# Patient Record
Sex: Female | Born: 1980 | Race: Black or African American | Hispanic: No | Marital: Married | Smoking: Never smoker
Health system: Southern US, Community
[De-identification: ages and names within clinical notes are randomized; demographics above are authoritative.]

## PROBLEM LIST (undated history)

## (undated) DIAGNOSIS — Z8661 Personal history of infections of the central nervous system: Secondary | ICD-10-CM

## (undated) DIAGNOSIS — Z8701 Personal history of pneumonia (recurrent): Secondary | ICD-10-CM

## (undated) DIAGNOSIS — I313 Pericardial effusion (noninflammatory): Secondary | ICD-10-CM

## (undated) DIAGNOSIS — I341 Nonrheumatic mitral (valve) prolapse: Secondary | ICD-10-CM

## (undated) DIAGNOSIS — E559 Vitamin D deficiency, unspecified: Secondary | ICD-10-CM

## (undated) DIAGNOSIS — G8929 Other chronic pain: Secondary | ICD-10-CM

## (undated) DIAGNOSIS — Z9289 Personal history of other medical treatment: Secondary | ICD-10-CM

## (undated) DIAGNOSIS — I3139 Other pericardial effusion (noninflammatory): Secondary | ICD-10-CM

## (undated) DIAGNOSIS — R519 Headache, unspecified: Secondary | ICD-10-CM

## (undated) DIAGNOSIS — R739 Hyperglycemia, unspecified: Secondary | ICD-10-CM

## (undated) DIAGNOSIS — D649 Anemia, unspecified: Secondary | ICD-10-CM

## (undated) HISTORY — DX: Headache, unspecified: R51.9

## (undated) HISTORY — DX: Other chronic pain: G89.29

## (undated) HISTORY — DX: Personal history of pneumonia (recurrent): Z87.01

## (undated) HISTORY — DX: Hyperglycemia, unspecified: R73.9

## (undated) HISTORY — DX: Vitamin D deficiency, unspecified: E55.9

---

## 2012-05-13 ENCOUNTER — Emergency Department: Payer: Self-pay | Admitting: Emergency Medicine

## 2012-05-13 LAB — COMPREHENSIVE METABOLIC PANEL
Anion Gap: 9 (ref 7–16)
Calcium, Total: 9 mg/dL (ref 8.5–10.1)
Chloride: 110 mmol/L — ABNORMAL HIGH (ref 98–107)
Co2: 25 mmol/L (ref 21–32)
Creatinine: 0.8 mg/dL (ref 0.60–1.30)
EGFR (African American): >60
Glucose: 82 mg/dL (ref 65–99)
Potassium: 3.1 mmol/L — ABNORMAL LOW (ref 3.5–5.1)
SGOT(AST): 23 U/L (ref 15–37)
Sodium: 144 mmol/L (ref 136–145)
Total Protein: 7.2 g/dL (ref 6.4–8.2)

## 2012-05-13 LAB — CBC WITH DIFFERENTIAL/PLATELET
Basophil #: 0 10*3/uL (ref 0.0–0.1)
Basophil %: 0.9 %
Eosinophil #: 0.4 10*3/uL (ref 0.0–0.7)
Eosinophil %: 14.7 %
HCT: 36.7 % (ref 35.0–47.0)
HGB: 11.6 g/dL — ABNORMAL LOW (ref 12.0–16.0)
Lymphocyte %: 42.9 %
MCH: 26.1 pg (ref 26.0–34.0)
MCHC: 31.6 g/dL — ABNORMAL LOW (ref 32.0–36.0)
MCV: 83 fL (ref 80–100)
Monocyte #: 0.3 x10 3/mm (ref 0.2–0.9)
Monocyte %: 9 %
Neutrophil #: 1 10*3/uL — ABNORMAL LOW (ref 1.4–6.5)
Neutrophil %: 32.5 %
Platelet: 271 10*3/uL (ref 150–440)
RDW: 20.5 % — ABNORMAL HIGH (ref 11.5–14.5)
WBC: 3 10*3/uL — ABNORMAL LOW (ref 3.6–11.0)

## 2012-12-02 DIAGNOSIS — Z98891 History of uterine scar from previous surgery: Secondary | ICD-10-CM | POA: Insufficient documentation

## 2012-12-02 DIAGNOSIS — Z9289 Personal history of other medical treatment: Secondary | ICD-10-CM | POA: Insufficient documentation

## 2013-02-17 ENCOUNTER — Emergency Department: Payer: Self-pay | Admitting: Emergency Medicine

## 2013-02-17 LAB — COMPREHENSIVE METABOLIC PANEL WITH GFR
Albumin: 3.6 g/dL
Alkaline Phosphatase: 94 U/L
Anion Gap: 4 — ABNORMAL LOW
BUN: 7 mg/dL
Bilirubin,Total: 0.3 mg/dL
Calcium, Total: 8.5 mg/dL
Chloride: 110 mmol/L — ABNORMAL HIGH
Co2: 26 mmol/L
Creatinine: 0.75 mg/dL
EGFR (African American): >60
EGFR (Non-African Amer.): >60
Glucose: 88 mg/dL
Osmolality: 277
Potassium: 4.1 mmol/L
SGOT(AST): 20 U/L
SGPT (ALT): 14 U/L
Sodium: 140 mmol/L
Total Protein: 7.4 g/dL

## 2013-02-17 LAB — CBC
HCT: 34.9 % — ABNORMAL LOW
HGB: 11.3 g/dL — ABNORMAL LOW
MCH: 27.4 pg
MCHC: 32.3 g/dL
MCV: 85 fL
Platelet: 342 x10 3/mm 3
RBC: 4.11 X10 6/mm 3
RDW: 13.7 %
WBC: 4.3 x10 3/mm 3

## 2013-02-17 LAB — URINALYSIS, COMPLETE
Bilirubin,UR: NEGATIVE
Blood: NEGATIVE
Glucose,UR: NEGATIVE mg/dL (ref 0–75)
Ketone: NEGATIVE
Nitrite: NEGATIVE
Ph: 5 (ref 4.5–8.0)
Protein: NEGATIVE
RBC,UR: <1 /HPF (ref 0–5)
Specific Gravity: 1.026 (ref 1.003–1.030)
WBC UR: 2 /HPF (ref 0–5)

## 2013-02-17 LAB — LIPASE, BLOOD: Lipase: 86 U/L (ref 73–393)

## 2013-02-17 IMAGING — US US OB < 14 WEEKS - US OB TV
1 series · 14 of 28 positions shown · non-contrast
Comparison: none

REASON FOR EXAM: pelvic pain
COMMENTS:

[Series 1: us ob < 14 weeks - us ob tv · 0.21mm/px · 14 of 100 slices shown]
[im 4/100]
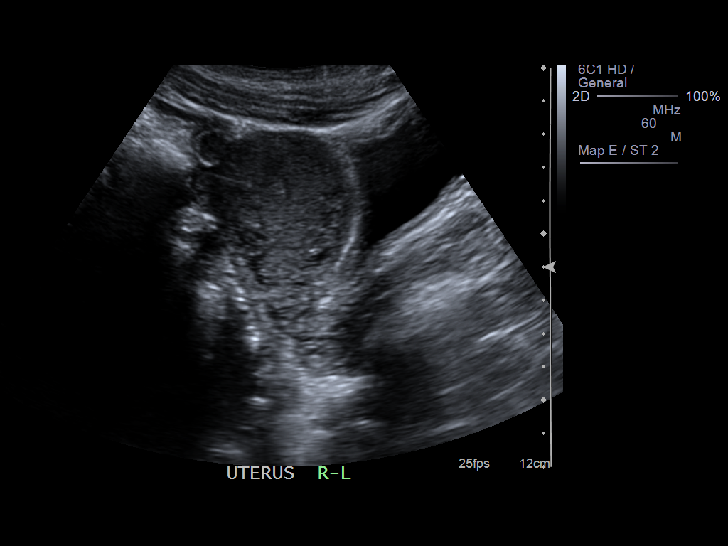
[im 12/100]
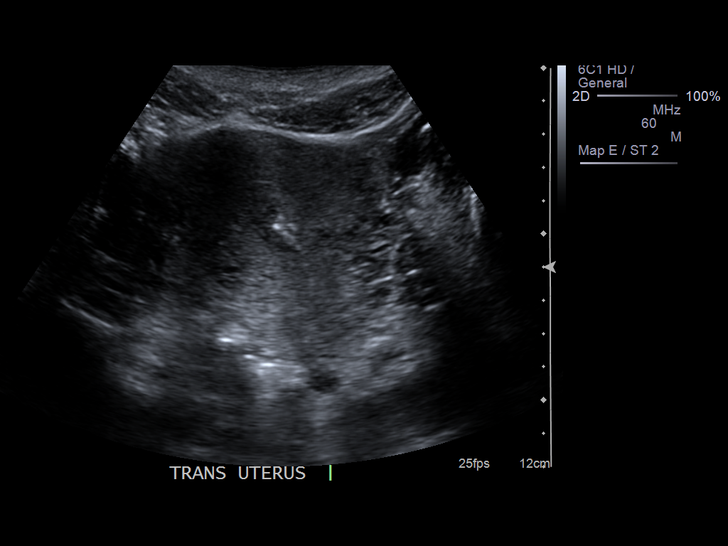
[im 19/100]
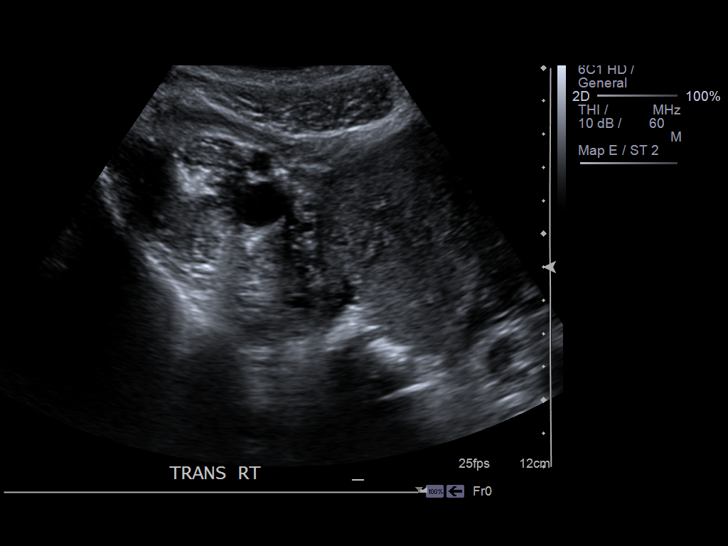
[im 26/100]
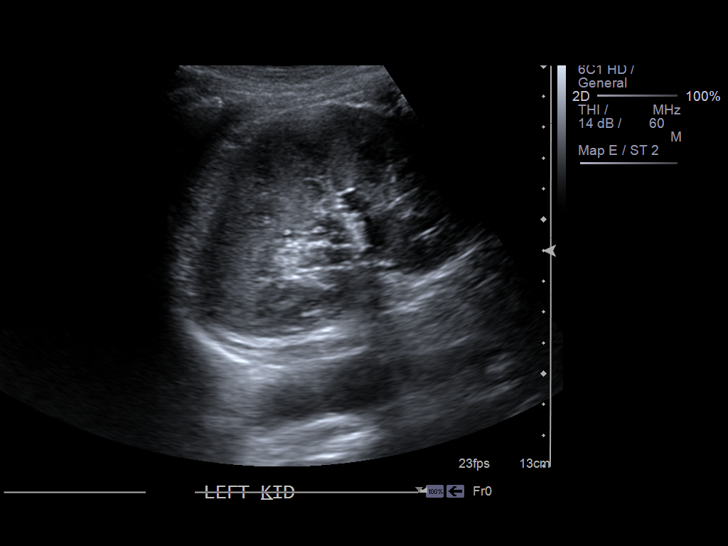
[im 34/100]
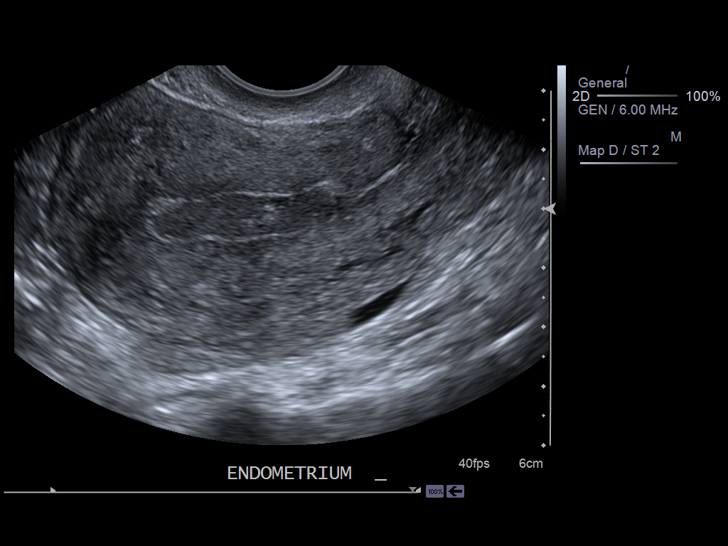
[im 41/100]
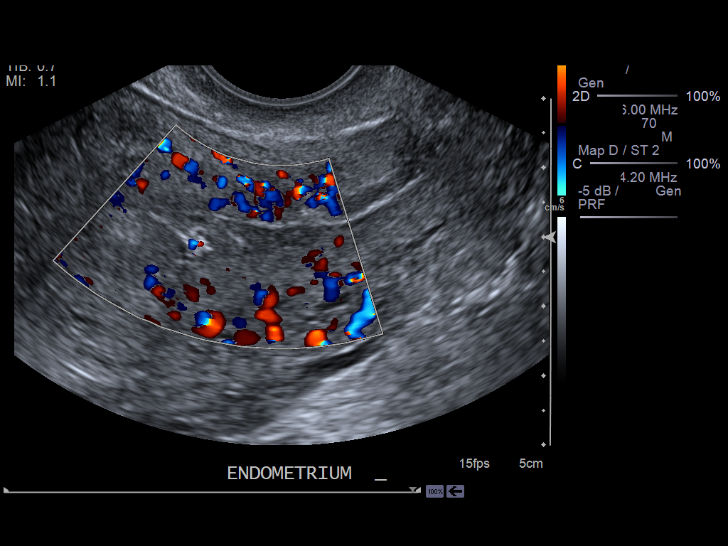
[im 48/100]
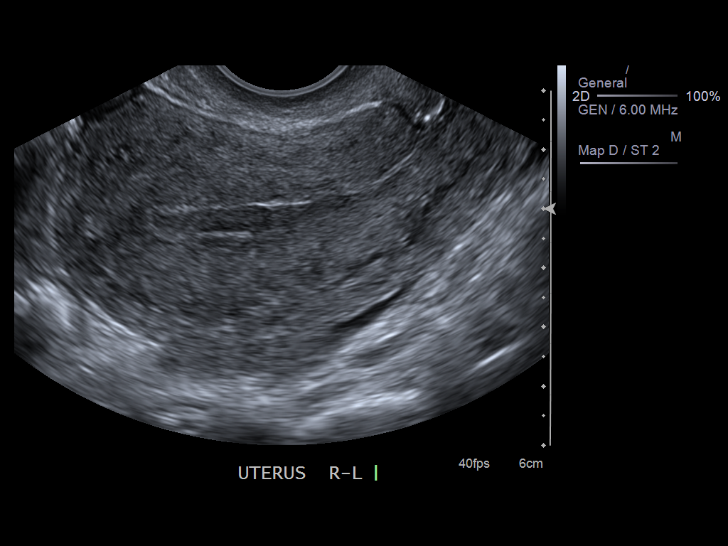
[im 56/100]
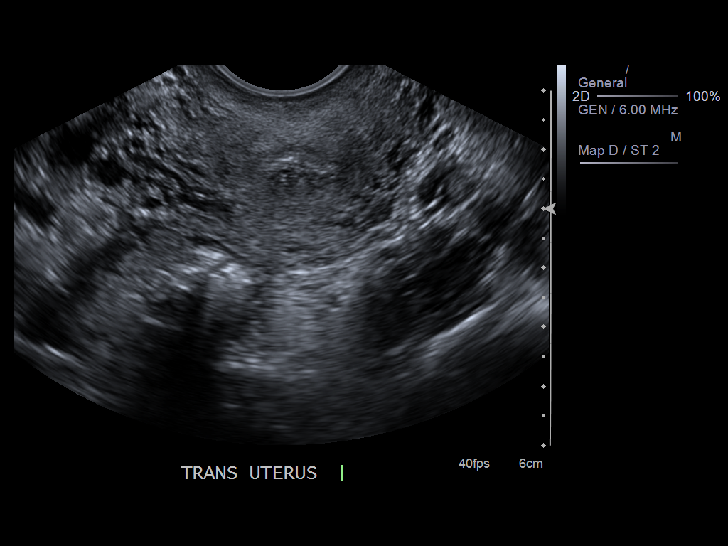
[im 63/100]
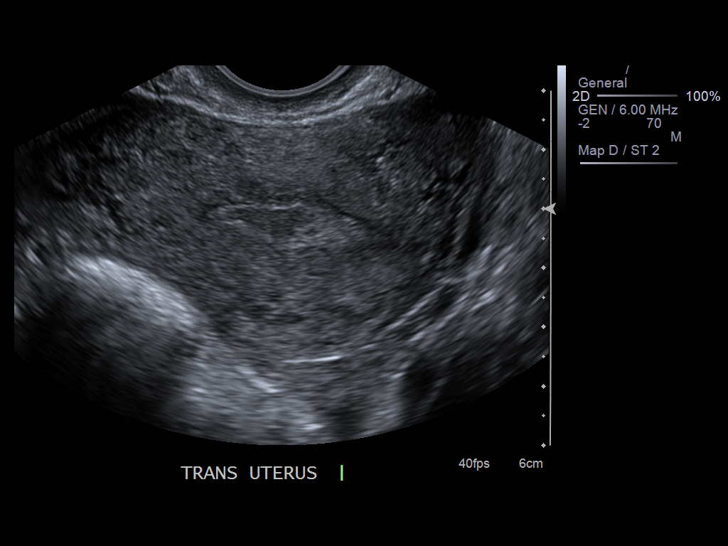
[im 70/100]
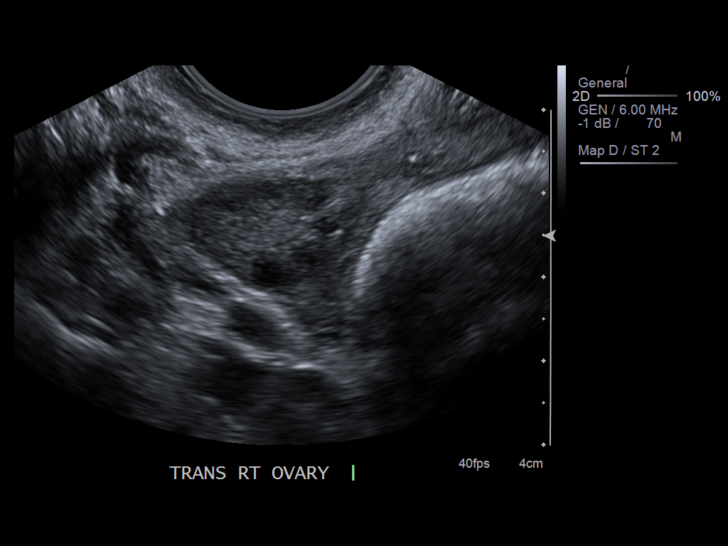
[im 78/100]
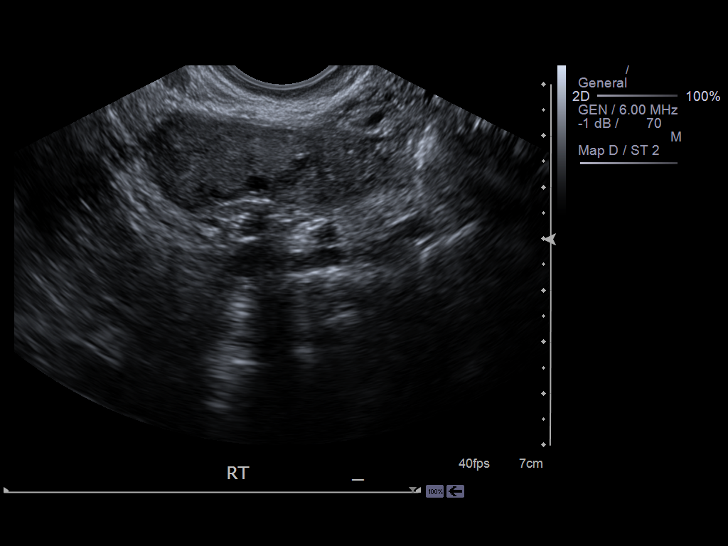
[im 85/100]
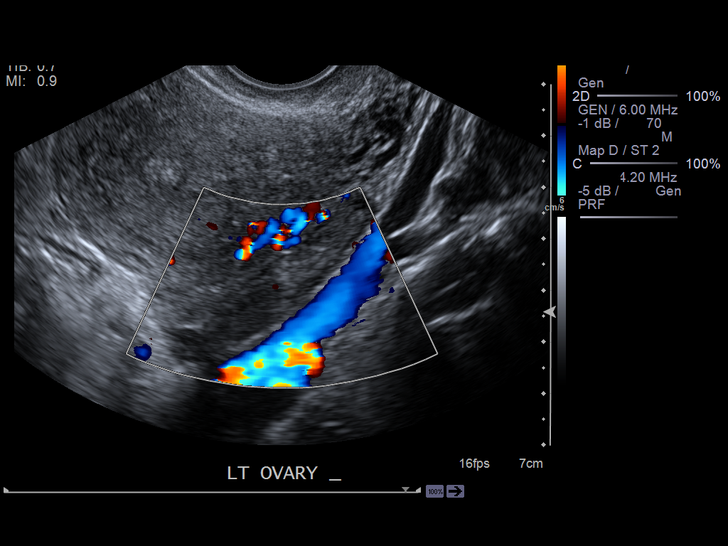
[im 92/100]
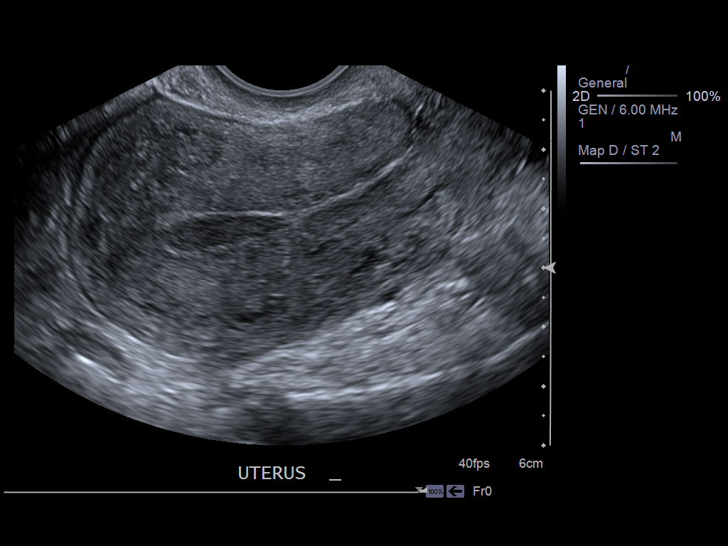
[im 100/100]
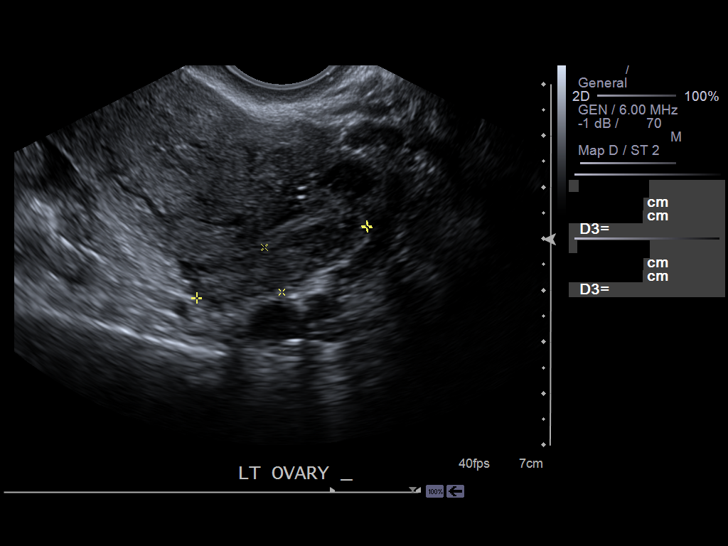

[14 of 28 positions shown; findings below may reference images not displayed]

PROCEDURE:     US  - US OB LESS THAN 14 WEEKS/W TRANS  - [DATE]  [DATE]

RESULT:     Pelvic sonogram with transabdominal and endovaginal scanning
demonstrates the uterus measures 8.61 x 5.46 x 3.98 cm. Endometrial stripe
thickness is 8.6 mm. There is in no evidence of hydronephrosis in either
kidney. There is free fluid in the cul-de-sac. There some hyperechogenicity
with evidence of blood flow in the endometrial cavity. Given the recent
surgical procedure correlate for underlying hemorrhage, infection or
retained products of conception. Both ovaries show normal size and
echotexture with evidence of arterial and venous blood flow.
IMPRESSION: Hypervascular appearance with pelvic free fluid and some
hyper echogenicity within the endometrium. Gynecologic followup is
recommended

[REDACTED] (*)

## 2014-12-26 ENCOUNTER — Ambulatory Visit: Payer: Self-pay | Admitting: Family Medicine

## 2015-07-18 DIAGNOSIS — R87810 Cervical high risk human papillomavirus (HPV) DNA test positive: Secondary | ICD-10-CM | POA: Insufficient documentation

## 2015-10-25 DIAGNOSIS — G43909 Migraine, unspecified, not intractable, without status migrainosus: Secondary | ICD-10-CM | POA: Insufficient documentation

## 2015-10-28 DIAGNOSIS — I341 Nonrheumatic mitral (valve) prolapse: Secondary | ICD-10-CM | POA: Insufficient documentation

## 2015-11-07 ENCOUNTER — Emergency Department
Admission: EM | Admit: 2015-11-07 | Discharge: 2015-11-07 | Disposition: A | Discharge status: Home / Self Care | Payer: BLUE CROSS/BLUE SHIELD | Attending: Emergency Medicine | Admitting: Emergency Medicine

## 2015-11-07 ENCOUNTER — Encounter: Payer: Self-pay | Admitting: Emergency Medicine

## 2015-11-07 DIAGNOSIS — H53149 Visual discomfort, unspecified: Secondary | ICD-10-CM | POA: Diagnosis not present

## 2015-11-07 DIAGNOSIS — R197 Diarrhea, unspecified: Secondary | ICD-10-CM | POA: Insufficient documentation

## 2015-11-07 DIAGNOSIS — R51 Headache: Secondary | ICD-10-CM | POA: Diagnosis not present

## 2015-11-07 DIAGNOSIS — O210 Mild hyperemesis gravidarum: Secondary | ICD-10-CM | POA: Diagnosis not present

## 2015-11-07 DIAGNOSIS — R1084 Generalized abdominal pain: Secondary | ICD-10-CM | POA: Diagnosis not present

## 2015-11-07 DIAGNOSIS — O9989 Other specified diseases and conditions complicating pregnancy, childbirth and the puerperium: Secondary | ICD-10-CM | POA: Insufficient documentation

## 2015-11-07 DIAGNOSIS — Z3A14 14 weeks gestation of pregnancy: Secondary | ICD-10-CM | POA: Insufficient documentation

## 2015-11-07 DIAGNOSIS — O99352 Diseases of the nervous system complicating pregnancy, second trimester: Secondary | ICD-10-CM | POA: Diagnosis not present

## 2015-11-07 DIAGNOSIS — R519 Headache, unspecified: Secondary | ICD-10-CM

## 2015-11-07 HISTORY — DX: Pericardial effusion (noninflammatory): I31.3

## 2015-11-07 HISTORY — DX: Personal history of infections of the central nervous system: Z86.61

## 2015-11-07 HISTORY — DX: Other pericardial effusion (noninflammatory): I31.39

## 2015-11-07 HISTORY — DX: Anemia, unspecified: D64.9

## 2015-11-07 LAB — CBC WITH DIFFERENTIAL/PLATELET
BASOS PCT: 0 %
Basophils Absolute: 0 10*3/uL (ref 0–0.1)
EOS ABS: 0 10*3/uL (ref 0–0.7)
Eosinophils Relative: 1 %
HEMATOCRIT: 38.3 % (ref 35.0–47.0)
HEMOGLOBIN: 12.9 g/dL (ref 12.0–16.0)
LYMPHS ABS: 0.8 10*3/uL — AB (ref 1.0–3.6)
Lymphocytes Relative: 12 %
MCH: 28.8 pg (ref 26.0–34.0)
MCHC: 33.6 g/dL (ref 32.0–36.0)
MCV: 85.8 fL (ref 80.0–100.0)
Monocytes Absolute: 0.2 10*3/uL (ref 0.2–0.9)
Monocytes Relative: 3 %
NEUTROS ABS: 5.4 10*3/uL (ref 1.4–6.5)
NEUTROS PCT: 84 %
Platelets: 248 10*3/uL (ref 150–440)
RBC: 4.46 MIL/uL (ref 3.80–5.20)
RDW: 13.5 % (ref 11.5–14.5)
WBC: 6.4 10*3/uL (ref 3.6–11.0)

## 2015-11-07 LAB — URINALYSIS COMPLETE WITH MICROSCOPIC (ARMC ONLY)
Bilirubin Urine: NEGATIVE
Glucose, UA: NEGATIVE mg/dL
HGB URINE DIPSTICK: NEGATIVE
Leukocytes, UA: NEGATIVE
NITRITE: NEGATIVE
PH: 7 (ref 5.0–8.0)
PROTEIN: NEGATIVE mg/dL
SPECIFIC GRAVITY, URINE: 1.018 (ref 1.005–1.030)
WBC UA: NONE SEEN WBC/hpf (ref 0–5)

## 2015-11-07 LAB — COMPREHENSIVE METABOLIC PANEL
ALT: 16 U/L (ref 14–54)
ANION GAP: 7 (ref 5–15)
AST: 17 U/L (ref 15–41)
Albumin: 3.5 g/dL (ref 3.5–5.0)
Alkaline Phosphatase: 59 U/L (ref 38–126)
BUN: 10 mg/dL (ref 6–20)
CALCIUM: 9.2 mg/dL (ref 8.9–10.3)
CHLORIDE: 106 mmol/L (ref 101–111)
CO2: 21 mmol/L — ABNORMAL LOW (ref 22–32)
CREATININE: 0.66 mg/dL (ref 0.44–1.00)
Glucose, Bld: 83 mg/dL (ref 65–99)
Potassium: 3.8 mmol/L (ref 3.5–5.1)
SODIUM: 134 mmol/L — AB (ref 135–145)
Total Bilirubin: 0.6 mg/dL (ref 0.3–1.2)
Total Protein: 7.4 g/dL (ref 6.5–8.1)

## 2015-11-07 LAB — LIPASE, BLOOD: Lipase: 18 U/L (ref 11–51)

## 2015-11-07 MED ORDER — MORPHINE SULFATE (PF) 2 MG/ML IV SOLN
2.0000 mg | Freq: Once | INTRAVENOUS | Status: AC
  Administered 2015-11-07: 2 mg via INTRAVENOUS

## 2015-11-07 MED ORDER — SODIUM CHLORIDE 0.9 % IV BOLUS (SEPSIS)
1000.0000 mL | Freq: Once | INTRAVENOUS | Status: AC
  Administered 2015-11-07: 1000 mL via INTRAVENOUS

## 2015-11-07 MED ORDER — SODIUM CHLORIDE 0.9 % IV BOLUS (SEPSIS): 1000.0000 mL | Freq: Once | INTRAVENOUS | Status: AC

## 2015-11-07 MED ORDER — ONDANSETRON HCL 4 MG/2ML IJ SOLN
4.0000 mg | Freq: Once | INTRAMUSCULAR | Status: AC
  Administered 2015-11-07: 4 mg via INTRAVENOUS
  Filled 2015-11-07: qty 2

## 2015-11-07 MED ORDER — MORPHINE SULFATE (PF) 2 MG/ML IV SOLN
2.0000 mg | Freq: Once | INTRAVENOUS | Status: AC
  Filled 2015-11-07: qty 1

## 2015-11-07 MED ORDER — BUTALBITAL-APAP-CAFFEINE 50-325-40 MG PO TABS
1.0000 | ORAL_TABLET | Freq: Once | ORAL | Status: AC
  Administered 2015-11-07: 1 via ORAL
  Filled 2015-11-07: qty 1

## 2015-11-07 MED ORDER — DIPHENHYDRAMINE HCL 50 MG/ML IJ SOLN
25.0000 mg | Freq: Once | INTRAMUSCULAR | Status: AC
  Administered 2015-11-07: 25 mg via INTRAVENOUS
  Filled 2015-11-07: qty 1

## 2015-11-07 NOTE — Discharge Instructions (Signed)
You were evaluated for headache which is similar to your chronic headaches which is improved after symptomatic treatment.  We discussed, call your neurologist office to try to get an appointment for next available.  Return to emergency room for any worsening headache, or any new symptoms including confusion, altered mental status, weakness, numbness, lightheadedness, passing out, fever, neck stiffness, or any other symptoms concerning to you.  You also evaluated for abdominal pain and nausea and vomiting, and although no certain cause was found, your exam and evaluation are reassuring today.  We discussed, return to emergency department for any worsening abdominal pain, pelvic pain, vaginal bleeding, vomiting with concern for dehydration, or any other symptoms concerning to you.   General Headache Without Cause A headache is pain or discomfort felt around the head or neck area. There are many causes and types of headaches. In some cases, the cause may not be found.  HOME CARE  Managing Pain  Take over-the-counter and prescription medicines only as told by your doctor.  Lie down in a dark, quiet room when you have a headache.  If directed, apply ice to the head and neck area:  Put ice in a plastic bag.  Place a towel between your skin and the bag.  Leave the ice on for 20 minutes, 2-3 times per day.  Use a heating pad or hot shower to apply heat to the head and neck area as told by your doctor.  Keep lights dim if bright lights bother you or make your headaches worse. Eating and Drinking  Eat meals on a regular schedule.  Lessen how much alcohol you drink.  Lessen how much caffeine you drink, or stop drinking caffeine. General Instructions  Keep all follow-up visits as told by your doctor. This is important.  Keep a journal to find out if certain things bring on headaches. For example, write down:  What you eat and drink.  How much sleep you get.  Any change to your diet  or medicines.  Relax by getting a massage or doing other relaxing activities.  Lessen stress.  Sit up straight. Do not tighten (tense) your muscles.  Do not use tobacco products. This includes cigarettes, chewing tobacco, or e-cigarettes. If you need help quitting, ask your doctor.  Exercise regularly as told by your doctor.  Get enough sleep. This often means 7-9 hours of sleep. GET HELP IF:  Your symptoms are not helped by medicine.  You have a headache that feels different than the other headaches.  You feel sick to your stomach (nauseous) or you throw up (vomit).  You have a fever. GET HELP RIGHT AWAY IF:   Your headache becomes really bad.  You keep throwing up.  You have a stiff neck.  You have trouble seeing.  You have trouble speaking.  You have pain in the eye or ear.  Your muscles are weak or you lose muscle control.  You lose your balance or have trouble walking.  You feel like you will pass out (faint) or you pass out.  You have confusion.   This information is not intended to replace advice given to you by your health care provider. Make sure you discuss any questions you have with your health care provider.   Document Released: 08/19/2008 Document Revised: 08/01/2015 Document Reviewed: 03/05/2015 Elsevier Interactive Patient Education 2016 Elsevier Inc[employer]  Abdominal Pain, Adult Many things can cause abdominal pain. Usually, abdominal pain is not caused by a disease and will improve without treatment.  It can often be observed and treated at home. Your health care provider will do a physical exam and possibly order blood tests and X-rays to help determine the seriousness of your pain. However, in many cases, more time must pass before a clear cause of the pain can be found. Before that point, your health care provider may not know if you need more testing or further treatment. HOME CARE INSTRUCTIONS Monitor your abdominal pain for any changes. The  following actions may help to alleviate any discomfort you are experiencing:  Only take over-the-counter or prescription medicines as directed by your health care provider.  Do not take laxatives unless directed to do so by your health care provider.  Try a clear liquid diet (broth, tea, or water) as directed by your health care provider. Slowly move to a bland diet as tolerated. SEEK MEDICAL CARE IF:  You have unexplained abdominal pain.  You have abdominal pain associated with nausea or diarrhea.  You have pain when you urinate or have a bowel movement.  You experience abdominal pain that wakes you in the night.  You have abdominal pain that is worsened or improved by eating food.  You have abdominal pain that is worsened with eating fatty foods.  You have a fever. SEEK IMMEDIATE MEDICAL CARE IF:  Your pain does not go away within 2 hours.  You keep throwing up (vomiting).  Your pain is felt only in portions of the abdomen, such as the right side or the left lower portion of the abdomen.  You pass bloody or black tarry stools. MAKE SURE YOU:  Understand these instructions.  Will watch your condition.  Will get help right away if you are not doing well or get worse.   This information is not intended to replace advice given to you by your health care provider. Make sure you discuss any questions you have with your health care provider.   Document Released: 08/20/2005 Document Revised: 08/01/2015 Document Reviewed: 07/20/2013 Elsevier Interactive Patient Education [employer] Else[employer] Inc[employer]

## 2015-11-07 NOTE — ED Notes (Addendum)
Pt has been vomiting all through first trimester stopped Sunday and then began vomiting again a few hours ago with severe lower mid abdominal pain. Has had diarrhea last 2 days.  Generalized body aches.  Spitting in cup but husband reports this is normal all through pregnancy.  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  [redacted] weeks pregnant.  US previous showed IUP.  G2P1.  Pt has slightly labored breathing. C/o shob. Hx pericardial effusion follow for.

## 2015-11-07 NOTE — ED Provider Notes (Signed)
[facility] Emergency Department Provider Note   ____________________________________________  Time seen:  I have reviewed the triage vital signs and the triage nursing note.  HISTORY  Chief Complaint Emesis During Pregnancy and Abdominal Pain   Historian Patient and husband  HPI Katie Hays is a 34 y.o. female is here for evaluation of abdominal pain, headache, nausea, vomiting for several days. 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 She is not able to take her headache medication due to the fact that she is now pregnant. She is [redacted] weeks pregnant and had nausea and vomiting daily up until about a week ago, and she just started having nausea and vomiting again. Today she's had multiple episodes of diarrhea and vomiting, and her son has a similar viral gastritis illness currently. No fever. She is complaining of moderate central and lower abdominal cramping. No vaginal bleeding.    Past Medical History  Diagnosis Date  . History of meningitis   . Pericardial effusion   . Anemia     There are no active problems to display for this patient.   Past Surgical History  Procedure Laterality Date  . Cesarean section      No current outpatient prescriptions on file.  Allergies Dilaudid  History reviewed. No pertinent family history.  Social History Social History  Substance Use Topics  . Smoking status: Never Smoker   . Smokeless tobacco: None  . Alcohol Use: No    Review of Systems  Constitutional: Negative for fever. Eyes: Photosensitivity to light. ENT: Negative for sore throat. Cardiovascular: Negative for chest pain. Respiratory: Negative for shortness of breath. Gastrointestinal: Positive per history of present  illness Genitourinary: Negative for dysuria. Musculoskeletal: Negative for back pain. Skin: Negative for rash. Neurological: Positive for headache. 10 point Review of Systems otherwise negative ____________________________________________   PHYSICAL EXAM:  VITAL SIGNS: ED Triage Vitals  Enc Vitals Group     BP 11/07/15 1709 102/48 mmHg     Pulse Rate 11/07/15 1709 106     Resp 11/07/15 1709 28     Temp 11/07/15 1709 99.1 F (37.3 C)     Temp Source 11/07/15 1709 Oral     SpO2 11/07/15 1709 99 %     Weight 11/07/15 1709 163 lb (73.936 kg)     Height 11/07/15 1709 5\' 5"  (1.651 m)     Head Cir --      Peak Flow --      Pain Score 11/07/15 1709 9     Pain Loc --      Pain Edu? --      Excl. in GC? --      Constitutional: Alert and oriented.  Well on her side holding her abdomen and pain, pillowcase over her eyes do show from the light. Eyes: Conjunctivae are normal. PERRL. Normal extraocular movements. Positive photophobia. ENT   Head: Normocephalic and atraumatic.   Nose: No congestion/rhinnorhea.   Mouth/Throat: Mucous membranes are moist.   Neck: No stridor. No neck stiffness.  Cardiovascular/Chest: Normal rate, regular rhythm.  No murmurs, rubs, or gallops. Respiratory: Normal respiratory effort without tachypnea nor retractions. Breath sounds are clear and equal bilaterally. No wheezes/rales/rhonchi. Gastrointestinal: Soft. No distention, no guarding, no rebound. Moderate diffuse tenderness especially in the epigastrium.  No pelvic or right lower quadrant and left lower quadrant pain.  Genitourinary/rectal:Deferred Musculoskeletal: Nontender with normal range of motion in all extremities. No joint effusions.  No lower extremity tenderness.  No edema. Neurologic:  Normal speech and language. No gross or focal neurologic deficits are appreciated. Skin:  Skin is warm, dry and intact. No rash noted. Psychiatric: Mood and affect are normal. Speech and behavior  are normal. Patient exhibits appropriate insight and judgment.  ____________________________________________   EKG I, Governor Rooks, MD, the attending physician have personally viewed and interpreted all ECGs.  92 bpm. Sinus rhythm. Narrow QRS. Normal axis. Normal ST and T-wave. ____________________________________________  LABS (pertinent positives/negatives)  Urinalysis rare bacteria, 1+ ketones and otherwise negative Comprehensive metabolic panel without significant abnormalities CBC shows a normal white blood cell count 6.4, hemoglobin 12.9 platelet count of 248 Lipase 18  ____________________________________________  RADIOLOGY All Xrays were viewed by me. Imaging interpreted by Radiologist.  None __________________________________________  PROCEDURES  Procedure(s) performed: None  Critical Care performed: None  ____________________________________________   ED COURSE / ASSESSMENT AND PLAN  CONSULTATIONS: Discussed with Dr. Madalyn Rob, neurology  Pertinent labs & imaging results that were available during my care of the patient were reviewed by me and considered in my medical decision making (see chart for details).  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 She sees Dr. Malvin Johns with [facility] neurology, and she states that many medications do not work for her headaches. She had been controlled on Fioricet, however had been told to stop this medication when she found she was pregnant. She is currently [redacted] weeks pregnant, and has had a confirmed IUP per the patient by ultrasound with heartbeat. She's had no vaginal bleeding. She has dealt with nausea early in pregnancy which had resolved until recently. She is unsure if perhaps the abdominal pain and vomiting came on as a result of dealing with the severe headache, or if she also  got a virus that her young child has.  She's not had a fever. She's had no neurologic deficits. She stated that she tried Benadryl at home without success. She has not had success previously with IV narcotics, IV magnesium, IV Phenergan, IV Reglan, or Toradol.  I did initially try for symptomatic control with Zofran, IV fluids, and then morphine. She also tried a dose of Benadryl IV.  At this point I did discuss the case with the neurologist, who recommended consideration of IV magnesium and IV Decadron.  After reviewing with the patient the risk versus benefit, we chose to hold off on this. Instead patient will be given one Fioricet as this is reportedly the only thing that has worked in the past for her chronic headaches, and it is a category C. We discussed benefit outweighs the risk.  Upon reevaluation, patient was feeling much improved with no more abdominal pain or nausea, and much improved headache.  Her laboratory evaluation was reassuring and I do not suspect an acute intra-abdominal emergency. She's not having any vaginal or pelvic complaints and we did defer a pelvic exam. In terms of the headache, it seems consistent with her prior headaches, with no new or different features. I do not think acute brain imaging is indicated. She is to follow-up with her neurologist, and call in the morning. She states that her neurology appointment had been moved back due to the office  constraints into January, and this is something they are concerned about. I let them know I have no way to personally make the appointment, but they should call the office in the morning.   Patient / Family / Caregiver informed of clinical course, medical decision-making process, and agree with plan.   I discussed return precautions, follow-up instructions, and discharged instructions with patient and/or family.  ___________________________________________   FINAL CLINICAL IMPRESSION(S) / ED DIAGNOSES   Final  diagnoses:  Acute nonintractable headache, unspecified headache type  Generalized abdominal pain       Governor Rooks, MD 11/07/15 2234

## 2015-11-08 ENCOUNTER — Emergency Department
Admission: EM | Admit: 2015-11-08 | Discharge: 2015-11-08 | Disposition: A | Discharge status: Home / Self Care | Payer: BLUE CROSS/BLUE SHIELD | Attending: Emergency Medicine | Admitting: Emergency Medicine

## 2015-11-08 ENCOUNTER — Encounter: Payer: Self-pay | Admitting: Emergency Medicine

## 2015-11-08 DIAGNOSIS — Z79899 Other long term (current) drug therapy: Secondary | ICD-10-CM | POA: Diagnosis not present

## 2015-11-08 DIAGNOSIS — Z3A14 14 weeks gestation of pregnancy: Secondary | ICD-10-CM | POA: Diagnosis not present

## 2015-11-08 DIAGNOSIS — R51 Headache: Secondary | ICD-10-CM | POA: Insufficient documentation

## 2015-11-08 DIAGNOSIS — G8929 Other chronic pain: Secondary | ICD-10-CM | POA: Diagnosis not present

## 2015-11-08 DIAGNOSIS — O210 Mild hyperemesis gravidarum: Secondary | ICD-10-CM | POA: Insufficient documentation

## 2015-11-08 DIAGNOSIS — O9989 Other specified diseases and conditions complicating pregnancy, childbirth and the puerperium: Secondary | ICD-10-CM | POA: Insufficient documentation

## 2015-11-08 DIAGNOSIS — R519 Headache, unspecified: Secondary | ICD-10-CM

## 2015-11-08 MED ORDER — SODIUM CHLORIDE 0.9 % IV BOLUS (SEPSIS)
1000.0000 mL | Freq: Once | INTRAVENOUS | Status: AC
  Administered 2015-11-08: 1000 mL via INTRAVENOUS

## 2015-11-08 MED ORDER — BUTALBITAL-APAP-CAFFEINE 50-325-40 MG PO TABS
1.0000 | ORAL_TABLET | Freq: Once | ORAL | Status: AC
  Administered 2015-11-08: 1 via ORAL
  Filled 2015-11-08: qty 1

## 2015-11-08 MED ORDER — DIPHENHYDRAMINE HCL 50 MG/ML IJ SOLN
12.5000 mg | Freq: Once | INTRAMUSCULAR | Status: AC
  Administered 2015-11-08: 12.5 mg via INTRAVENOUS
  Filled 2015-11-08: qty 1

## 2015-11-08 NOTE — Discharge Instructions (Signed)
You have declined lumbar puncture, spinal tap, I would prefer not to have a CT scan because you are pregnant. this is certainly your choice but it is not unreasonable but it does limit what we can diagnose. If you feel worse in any way please return to the emergency department for further evaluation.

## 2015-11-08 NOTE — ED Notes (Signed)
Pt presents with headache since Monday with n/v/d and was seen here yesterday for same, followed up with  Neuro today and they sent pt over for iv fluids. Pt is [redacted] wks pregnant.

## 2015-11-08 NOTE — ED Provider Notes (Addendum)
[facility] Emergency Department Provider Note  ____________________________________________   I have reviewed the triage vital signs and the nursing notes.   HISTORY  Chief Complaint Headache; Nausea; and Emesis During Pregnancy    HPI Katie Hays is a 34 y.o. female presents today with what she describes as her chronic headache. She has had this off and on for several years, and this is her normal headache with photophobia and nausea as well as sensitivity to sound. It was gradual in onset. Patient was seen here yesterday, she was holding off on Fioricet for these headaches as long as they're given with IV fluid will make her feel better. She does understand the Fioricet is category C. He also understands Benadryl) pregnancy category. She denies any focal numbness or weakness, this is not a different headache from her normal headache, not sudden onset, not worst headache of life. She denies any abdominal pain or vaginal bleeding.  Past Medical History  Diagnosis Date  . History of meningitis   . Pericardial effusion   . Anemia     There are no active problems to display for this patient.   Past Surgical History  Procedure Laterality Date  . Cesarean section      Current Outpatient Rx  Name  Route  Sig  Dispense  Refill  . Prenatal Vit-Fe Fumarate-FA (PRENATAL MULTIVITAMIN) TABS tablet   Oral   Take 1 tablet by mouth daily at 12 noon.           Allergies Dilaudid  No family history on file.  Social History Social History  Substance Use Topics  . Smoking status: Never Smoker   . Smokeless tobacco: None  . Alcohol Use: No    Review of Systems Constitutional: No fever/chills Eyes: No visual changes. ENT: No sore throat. No stiff neck no neck pain Cardiovascular: Denies chest pain. Respiratory: Denies shortness of breath. Gastrointestinal:   no vomiting.  No diarrhea.  No constipation. Genitourinary: Negative for  dysuria. Musculoskeletal: Negative lower extremity swelling Skin: Negative for rash. Neurological: Negative for headaches, focal weakness or numbness. 10-point ROS otherwise negative.  ____________________________________________   PHYSICAL EXAM:  VITAL SIGNS: ED Triage Vitals  Enc Vitals Group     BP 11/08/15 1639 97/49 mmHg     Pulse Rate 11/08/15 1639 92     Resp 11/08/15 1639 22     Temp 11/08/15 1639 98.6 F (37 C)     Temp Source 11/08/15 1639 Oral     SpO2 11/08/15 1639 100 %     Weight 11/08/15 1639 160 lb (72.576 kg)     Height 11/08/15 1639  (1.651 m)     Head Cir --      Peak Flow --      Pain Score 11/08/15 1639 10     Pain Loc --      Pain Edu? --      Excl. in GC? --     Constitutional: Alert and oriented. Well appearing and in no acute distress. sitting in a darkened room  Eyes: Conjunctivae are normal. PERRL. EOMI. Head: Atraumatic. Nose: No congestion/rhinnorhea. Mouth/Throat: Mucous membranes are moist.  Oropharynx non-erythematous. Neck: No stridor.   Nontender with no meningismus Cardiovascular: Normal rate, regular rhythm. Grossly normal heart sounds.  Good peripheral circulation. Respiratory: Normal respiratory effort.  No retractions. Lungs CTAB. Abdominal: Soft and nontender. No distention. No guarding no rebound Back:  There is no focal tenderness or step off there is no  midline tenderness there are no lesions noted. there is no CVA tenderness Musculoskeletal: No lower extremity tenderness. No joint effusions, no DVT signs strong distal pulses no edema Neurologic:  Normal speech and language. No gross focal neurologic deficits are appreciated.  Skin:  Skin is warm, dry and intact. No rash noted. Psychiatric: Mood and affect are normal. Speech and behavior are normal.  ____________________________________________   LABS (all labs ordered are listed, but only abnormal results are displayed)  Labs Reviewed - No data to  display ____________________________________________  EKG  I personally interpreted any EKGs ordered by me or triage  ____________________________________________  RADIOLOGY  I reviewed any imaging ordered by me or triage that were performed during my shift ____________________________________________   PROCEDURES  Procedure(s) performed: None  Critical Care performed: None  ____________________________________________   INITIAL IMPRESSION / ASSESSMENT AND PLAN / ED COURSE  Pertinent labs & imaging results that were available during my care of the patient were reviewed by me and considered in my medical decision making (see chart for details).  area reassuring exam, at patient's request we are giving her Fioricet and Benadryl and IV fluid although she does not look markedly dehydrated. Vital signs are reassuring.  ----------------------------------------- 5:50 PM on 11/08/2015 -----------------------------------------  Remains neurologically intact, headache much better. This time there is nothing to suggest meningitis, or subarachnoid hemorrhage or other acute intracranial pathologies. Patient would prefer not to have a CT scan because the radiation burden and she understands limitation since puts on the workup however this time with a normal neurologic exam and chronic headache amenable to her usual medications I have low suspicion that we would find anything anyway. We will continue to monitor the patient is to give her IV fluid and reassessed. She is tolerating by mouth at this time.  ----------------------------------------- 6:12 PM on 11/08/2015 -----------------------------------------  Cranial nerves II through XII are grossly intact 5 out of 5 strength bilateral upper and lower extremity. Finger to nose within normal limits heel to shin within normal limits, speech is normal with no word finding difficulty or dysarthria, reflexes symmetric, pupils are equally round  and reactive to light, there is no pronator drift, sensation is normal, vision is intact to confrontation, gait is deferred, there is no nystagmus, normal neurologic exam patient is laughing and joking with me in the room and feels much better. She understands that without lumbar puncture I cannot rule out head bleed, nor can I rule out meningitis or other intracranial pathology and she understands this. She refuses LP. She states she got meningitis once from an epidural and she does not wish to have any risk of that again. Patient is afebrile here and very well-appearing. I do not think she has meningitis but I have explained to her the limitations of the workup without further evaluation. She and her husband are very comfortable with the plan and they're eager to go home as soon as the IV fluid is done.   _______________________________________   FINAL CLINICAL IMPRESSION(S) / ED DIAGNOSES  Final diagnoses:  None     Jeanmarie PlantJames A Kieana Livesay, MD 11/08/15 1751  Jeanmarie PlantJames A Latrece Nitta, MD 11/08/15 [phone]

## 2016-04-03 ENCOUNTER — Emergency Department
Admission: EM | Admit: 2016-04-03 | Discharge: 2016-04-03 | Disposition: A | Discharge status: Home / Self Care | Payer: BLUE CROSS/BLUE SHIELD | Attending: Emergency Medicine | Admitting: Emergency Medicine

## 2016-04-03 DIAGNOSIS — G43909 Migraine, unspecified, not intractable, without status migrainosus: Secondary | ICD-10-CM | POA: Insufficient documentation

## 2016-04-03 DIAGNOSIS — G43009 Migraine without aura, not intractable, without status migrainosus: Secondary | ICD-10-CM

## 2016-04-03 DIAGNOSIS — R51 Headache: Secondary | ICD-10-CM | POA: Diagnosis present

## 2016-04-03 MED ORDER — SODIUM CHLORIDE 0.9 % IV BOLUS (SEPSIS)
1000.0000 mL | Freq: Once | INTRAVENOUS | Status: AC
  Administered 2016-04-03: 1000 mL via INTRAVENOUS

## 2016-04-03 MED ORDER — DIPHENHYDRAMINE HCL 50 MG/ML IJ SOLN
12.5000 mg | Freq: Once | INTRAMUSCULAR | Status: AC
  Administered 2016-04-03: 12.5 mg via INTRAVENOUS
  Filled 2016-04-03: qty 1

## 2016-04-03 MED ORDER — MORPHINE SULFATE (PF) 4 MG/ML IV SOLN
4.0000 mg | Freq: Once | INTRAVENOUS | Status: AC
  Administered 2016-04-03: 4 mg via INTRAVENOUS
  Filled 2016-04-03: qty 1

## 2016-04-03 MED ORDER — METOCLOPRAMIDE HCL 5 MG/ML IJ SOLN
10.0000 mg | Freq: Once | INTRAMUSCULAR | Status: AC
  Administered 2016-04-03: 10 mg via INTRAVENOUS
  Filled 2016-04-03: qty 2

## 2016-04-03 NOTE — ED Provider Notes (Addendum)
[facility] Emergency Department Provider Note  ____________________________________________   I have reviewed the triage vital signs and the nursing notes.   HISTORY  Chief Complaint Headache    HPI Katie Hays is a 35 y.o. female presents today with her normal migraine headache. She has them on a very regular basis. She has had extensive outpatient workup for that. Usually for your set works however she is afraid to take it in pregnancy that is the reason she is here. She has not vomited. She has photophobia. Gradual onset headache, not worst headache of life, started today. No fever no chills no stiff neck. No change from her chronic recurrent headaches. No numbness no weakness.Normal headache for her. She is still feeling the baby moves no vaginal discharge or fluid contraction's no complaints of any variety regarding her pregnancy    Past Medical History  Diagnosis Date  . History of meningitis   . Pericardial effusion   . Anemia     There are no active problems to display for this patient.   Past Surgical History  Procedure Laterality Date  . Cesarean section      Current Outpatient Rx  Name  Route  Sig  Dispense  Refill  . Prenatal Vit-Fe Fumarate-FA (PRENATAL MULTIVITAMIN) TABS tablet   Oral   Take 1 tablet by mouth daily at 12 noon.           Allergies Dilaudid  No family history on file.  Social History Social History  Substance Use Topics  . Smoking status: Never Smoker   . Smokeless tobacco: Not on file  . Alcohol Use: No    Review of Systems Constitutional: No fever/chills Eyes: No visual changes. ENT: No sore throat. No stiff neck no neck pain Cardiovascular: Denies chest pain. Respiratory: Denies shortness of breath. Gastrointestinal:   no vomiting.  No diarrhea.  No constipation. Genitourinary: Negative for dysuria. Musculoskeletal: Negative lower extremity swelling Skin: Negative for rash. Neurological:  Positive for headaches, Negative for focal weakness or numbness. 10-point ROS otherwise negative.  ____________________________________________   PHYSICAL EXAM:  VITAL SIGNS: ED Triage Vitals  Enc Vitals Group     BP 04/03/16 0043 104/60 mmHg     Pulse Rate 04/03/16 0040 70     Resp 04/03/16 0040 18     Temp 04/03/16 0040 98.5 F (36.9 C)     Temp Source 04/03/16 0040 Oral     SpO2 04/03/16 0040 100 %     Weight 04/03/16 0040 200 lb (90.719 kg)     Height --      Head Cir --      Peak Flow --      Pain Score 04/03/16 0041 10     Pain Loc --      Pain Edu? --      Excl. in GC? --     Constitutional: Alert and oriented. Well appearing and in no acute distress. Eyes: Conjunctivae are normal. PERRL. EOMI. Head: Atraumatic. Nose: No congestion/rhinnorhea. Mouth/Throat: Mucous membranes are moist.  Oropharynx non-erythematous. Neck: No stridor.   Nontender with no meningismus Cardiovascular: Normal rate, regular rhythm. Grossly normal heart sounds.  Good peripheral circulation. Respiratory: Normal respiratory effort.  No retractions. Lungs CTAB. Abdominal: Soft and nontender. No distention. No guarding no rebound Back:  There is no focal tenderness or step off there is no midline tenderness there are no lesions noted. there is no CVA tenderness Musculoskeletal: No lower extremity tenderness. No joint effusions, no  DVT signs strong distal pulses no edema Neurologic:  Cranial nerves II through XII are grossly intact 5 out of 5 strength bilateral upper and lower extremity. Finger to nose within normal limits heel to shin within normal limits, speech is normal with no word finding difficulty or dysarthria, reflexes symmetric, pupils are equally round and reactive to light, there is no pronator drift, sensation is normal, vision is intact to confrontation, gait is deferred, there is no nystagmus, normal neurologic exam Skin:  Skin is warm, dry and intact. No rash noted. Psychiatric:  Mood and affect are normal. Speech and behavior are normal.  ____________________________________________   LABS (all labs ordered are listed, but only abnormal results are displayed)  Labs Reviewed - No data to display ____________________________________________  EKG  I personally interpreted any EKGs ordered by me or triage  ____________________________________________  RADIOLOGY  I reviewed any imaging ordered by me or triage that were performed during my shift and, if possible, patient and/or family made aware of any abnormal findings. ____________________________________________   PROCEDURES  Procedure(s) performed: None  Critical Care performed: None  ____________________________________________   INITIAL IMPRESSION / ASSESSMENT AND PLAN / ED COURSE  Pertinent labs & imaging results that were available during my care of the patient were reviewed by me and considered in my medical decision making (see chart for details).   ----------------------------------------- 1:12 AM on 04/03/2016 -----------------------------------------  Patient states she has had morphine many times without any allergic reaction she is only allergic to Dilaudid. She is requesting morphine.  ----------------------------------------- 2:56 AM on 04/03/2016 -----------------------------------------  Headache is much improved but still 8 out of 10 she states she is requesting more pain medication. Remains neurologically intact. At this time there remains no indication to suspect that this is meningitis, dural vein thrombosis, head bleed or other acute intracranial pathology given the chronicity of complaint gradual onset recurrent nature and classic symptoms for this patient. No indication for acute workup of any pregnancy related concerns, as there are none.  ----------------------------------------- 3:57 AM on 04/03/2016 -----------------------------------------  Neurologic exam remains  normal headache is gone patient smiling eager to go home. Return precautions and follow-up given and understood. ____________________________________________   FINAL CLINICAL IMPRESSION(S) / ED DIAGNOSES  Final diagnoses:  None      This chart was dictated using voice recognition software.  Despite best efforts to proofread,  errors can occur which can change meaning.     Jeanmarie Plant, MD 04/03/16 [id number]  Jeanmarie Plant, MD 04/03/16 [phone]

## 2016-04-03 NOTE — Discharge Instructions (Signed)

## 2016-04-03 NOTE — ED Notes (Signed)
Pt in with co headaches hx of the same states normally has meds at home but pt is [redacted] weeks pregnant and not able to take them.  Pt denies any pregnancy concerns at this time.

## 2016-04-30 HISTORY — PX: TUBAL LIGATION: SHX77

## 2016-05-04 ENCOUNTER — Emergency Department: Payer: BLUE CROSS/BLUE SHIELD

## 2016-05-04 ENCOUNTER — Encounter: Payer: Self-pay | Admitting: Emergency Medicine

## 2016-05-04 ENCOUNTER — Emergency Department
Admission: EM | Admit: 2016-05-04 | Discharge: 2016-05-04 | Disposition: A | Discharge status: Home / Self Care | Payer: BLUE CROSS/BLUE SHIELD | Attending: Emergency Medicine | Admitting: Emergency Medicine

## 2016-05-04 DIAGNOSIS — Z79899 Other long term (current) drug therapy: Secondary | ICD-10-CM | POA: Diagnosis not present

## 2016-05-04 DIAGNOSIS — K5900 Constipation, unspecified: Secondary | ICD-10-CM | POA: Diagnosis not present

## 2016-05-04 DIAGNOSIS — R109 Unspecified abdominal pain: Secondary | ICD-10-CM

## 2016-05-04 DIAGNOSIS — R1084 Generalized abdominal pain: Secondary | ICD-10-CM | POA: Diagnosis present

## 2016-05-04 DIAGNOSIS — Z791 Long term (current) use of non-steroidal anti-inflammatories (NSAID): Secondary | ICD-10-CM | POA: Insufficient documentation

## 2016-05-04 LAB — URINALYSIS COMPLETE WITH MICROSCOPIC (ARMC ONLY)
Bilirubin Urine: NEGATIVE
Glucose, UA: NEGATIVE mg/dL
Ketones, ur: NEGATIVE mg/dL
LEUKOCYTES UA: NEGATIVE
NITRITE: NEGATIVE
PH: 8 (ref 5.0–8.0)
PROTEIN: NEGATIVE mg/dL
SPECIFIC GRAVITY, URINE: 1.009 (ref 1.005–1.030)

## 2016-05-04 LAB — CBC
HCT: 34.9 % — ABNORMAL LOW (ref 35.0–47.0)
Hemoglobin: 11.4 g/dL — ABNORMAL LOW (ref 12.0–16.0)
MCH: 29.4 pg (ref 26.0–34.0)
MCHC: 32.8 g/dL (ref 32.0–36.0)
MCV: 89.8 fL (ref 80.0–100.0)
PLATELETS: 251 10*3/uL (ref 150–440)
RBC: 3.89 MIL/uL (ref 3.80–5.20)
RDW: 13.9 % (ref 11.5–14.5)
WBC: 4.5 10*3/uL (ref 3.6–11.0)

## 2016-05-04 LAB — HEPATIC FUNCTION PANEL
ALBUMIN: 2.9 g/dL — AB (ref 3.5–5.0)
ALT: 32 U/L (ref 14–54)
AST: 41 U/L (ref 15–41)
Alkaline Phosphatase: 153 U/L — ABNORMAL HIGH (ref 38–126)
Total Bilirubin: 0.5 mg/dL (ref 0.3–1.2)
Total Protein: 6.5 g/dL (ref 6.5–8.1)

## 2016-05-04 LAB — BASIC METABOLIC PANEL
ANION GAP: 6 (ref 5–15)
BUN: 7 mg/dL (ref 6–20)
CALCIUM: 9 mg/dL (ref 8.9–10.3)
CO2: 25 mmol/L (ref 22–32)
Chloride: 107 mmol/L (ref 101–111)
Creatinine, Ser: 0.72 mg/dL (ref 0.44–1.00)
Glucose, Bld: 83 mg/dL (ref 65–99)
POTASSIUM: 4 mmol/L (ref 3.5–5.1)
Sodium: 138 mmol/L (ref 135–145)

## 2016-05-04 MED ORDER — OXYCODONE HCL 5 MG PO TABS
10.0000 mg | ORAL_TABLET | Freq: Once | ORAL | Status: AC
  Administered 2016-05-04: 10 mg via ORAL
  Filled 2016-05-04: qty 2

## 2016-05-04 MED ORDER — KETOROLAC TROMETHAMINE 30 MG/ML IJ SOLN
30.0000 mg | Freq: Once | INTRAMUSCULAR | Status: AC
  Administered 2016-05-04: 30 mg via INTRAVENOUS
  Filled 2016-05-04: qty 1

## 2016-05-04 MED ORDER — IOPAMIDOL (ISOVUE-300) INJECTION 61%
100.0000 mL | Freq: Once | INTRAVENOUS | Status: AC | PRN
  Administered 2016-05-04: 100 mL via INTRAVENOUS

## 2016-05-04 MED ORDER — MORPHINE SULFATE (PF) 4 MG/ML IV SOLN: 4.0000 mg | Freq: Once | INTRAVENOUS | Status: DC

## 2016-05-04 MED ORDER — ONDANSETRON HCL 4 MG/2ML IJ SOLN: 4.0000 mg | Freq: Once | INTRAMUSCULAR | Status: DC

## 2016-05-04 MED ORDER — DIATRIZOATE MEGLUMINE & SODIUM 66-10 % PO SOLN
15.0000 mL | ORAL | Status: AC
  Administered 2016-05-04: 15 mL via ORAL

## 2016-05-04 NOTE — ED Notes (Addendum)
Pt reports c-section on wed, d/c on Saturday with pain under control.  Pt now c/o swelling to hands legs and feet w/ numbness/tingling, though pedal and radial pulses strong x 4, sensation and motor intact.  Pt also c/o increased generalized abd pain, rated 10/10.  Pt reports constipation, LBM 5 days ago. Pt NAD at this time though has difficulty transferring from wheelchair to bed due to pain

## 2016-05-04 NOTE — ED Provider Notes (Signed)
[facility] Emergency Department Provider Note  Time seen: 7:19 AM  I have reviewed the triage vital signs and the nursing notes.   HISTORY  Chief Complaint Leg Swelling and Medication Problem    HPI Katie Hays is a 35 y.o. female who presents to the emergency department 5 days status post C-section at term [facility], with abdominal discomfort and swelling in her hands and feet. According to the patient she had her C-section on Wednesday, she was initially on a morphine PCA due to significant pain. She states this was discontinued Friday and she was maintained on oxycodone. Patient was discharged from the hospital yesterday, but states her abdominal pain has continued to increase since discharge and the oxycodone is not working so she came to the emergency department for evaluation. Describes 10 out of 10 generalized abdominal pain, dull aching sensation. States she has not had a bowel movement for approximately 5 days. Denies any nausea or vomiting. Denies any fever. Denies any dysuria. Patient is taking Tylenol and Percocet at home without relief.     Past Medical History  Diagnosis Date  . History of meningitis   . Pericardial effusion   . Anemia     There are no active problems to display for this patient.   Past Surgical History  Procedure Laterality Date  . Cesarean section      Current Outpatient Rx  Name  Route  Sig  Dispense  Refill  . ibuprofen (ADVIL,MOTRIN) 800 MG tablet   Oral   Take 800 mg by mouth every 6 (six) hours as needed.         Marland Kitchen. oxycodone (OXY-IR) 5 MG capsule   Oral   Take 5-15 mg by mouth every 4 (four) hours as needed.         . Prenat-FeBis-FePro-FA-CA-Omega (COMPLETE NATAL DHA) 29-1-200 & 250 MG MISC   Oral   Take 1 tablet by mouth daily.         . Prenatal Vit-Fe Fumarate-FA (PRENATAL MULTIVITAMIN) TABS tablet   Oral   Take 1 tablet by mouth daily at 12 noon.            Allergies Dilaudid  History reviewed. No pertinent family history.  Social History Social History  Substance Use Topics  . Smoking status: Never Smoker   . Smokeless tobacco: None  . Alcohol Use: No    Review of Systems Constitutional: Negative for fever Cardiovascular: Negative for chest pain. Respiratory: Negative for shortness of breath. Gastrointestinal: Positive for 10/10 abdominal pain. Positive for constipation. Genitourinary: Negative for dysuria. Musculoskeletal: Negative for back pain. Neurological: Negative for headache 10-point ROS otherwise negative.  ____________________________________________   PHYSICAL EXAM:  VITAL SIGNS: ED Triage Vitals  Enc Vitals Group     BP 05/04/16 0308 123/77 mmHg     Pulse Rate 05/04/16 0308 62     Resp 05/04/16 0308 20     Temp --      Temp src --      SpO2 05/04/16 0308 99 %     Weight 05/04/16 0308 200 lb (90.719 kg)     Height 05/04/16 0308 5\' 5"  (1.651 m)     Head Cir --      Peak Flow --      Pain Score 05/04/16 0301 10     Pain Loc --      Pain Edu? --      Excl. in GC? --     Constitutional: Alert  and oriented. Well appearing and in no distress. Eyes: Normal exam ENT   Head: Normocephalic and atraumatic.   Mouth/Throat: Mucous membranes are moist. Cardiovascular: Normal rate, regular rhythm. No murmur Respiratory: Normal respiratory effort without tachypnea nor retractions. Breath sounds are clear  Gastrointestinal: Soft, moderate diffuse tenderness to palpation. Well appearing C-section incision. No rebound or guarding. Musculoskeletal: Nontender with normal range of motion in all extremities. Neurologic:  Normal speech and language. No gross focal neurologic deficits  Skin:  Skin is warm, dry and intact.  Psychiatric: Mood and affect are normal. Speech and behavior are normal.   ____________________________________________     RADIOLOGY  CT shows some gas and fluid likely postoperative  changes. Significant constipation.  ____________________________________________    INITIAL IMPRESSION / ASSESSMENT AND PLAN / ED COURSE  Pertinent labs & imaging results that were available during my care of the patient were reviewed by me and considered in my medical decision making (see chart for details).  The patient presents the emergency department with abdominal discomfort since being discharged home yesterday from the hospital. We will try Toradol at the patient wishes not to have to dump her breast milk. We'll obtain labs, given her 10/10 diffuse abdominal pain we will proceed with a CT abdomen/pelvis to further evaluate. Patient is agreeable to this plan.  CT significant for constipation as well as fluid and gas most likely due to to her recent C-section. Patient has no drainage from the C-section incision. Minimal tenderness at the C-section incision from the tenderness is more throughout the entire abdomen. Given her constipation. We will proceed with an enema. Patient agreeable to plan. I have attempted to contact the patient's OB/GYN group at [facility].  We have attempted to contact OB/GYN on[facility]call [facility] several times. They've paged the provider multiple times. They've paged multiple providers and not got a call back. Supposedly a Dr. Cliffton Asters is on-call for Dr. Vilma Prader, but they're unable to reach either physician.   I was able to discuss the patient with Dr. Denny Peon of OB/GYN. She states this CT sounds like a normal five-day postoperative CT. Patient states she feels much better, denies any pain at this time after having a very large bowel movement. Dr. Denny Peon states the patient was complaining of chest pain yesterday to her here the patient states the chest pain had completely resolved she is not having any chest pain today. Vitals are normal. Labs are largely within normal limits with a normal CT scan. We'll discharge the patient home with OB/GYN follow-up this  week. ____________________________________________   FINAL CLINICAL IMPRESSION(S) / ED DIAGNOSES  Abdominal pain Constipation  Minna Antis, MD 05/04/16 1259

## 2016-05-04 NOTE — Discharge Instructions (Signed)
Abdominal Pain, Adult °Many things can cause belly (abdominal) pain. Most times, the belly pain is not dangerous. Many cases of belly pain can be watched and treated at home. °HOME CARE  °· Do not take medicines that help you go poop (laxatives) unless told to by your doctor. °· Only take medicine as told by your doctor. °· Eat or drink as told by your doctor. Your doctor will tell you if you should be on a special diet. °GET HELP IF: °· You do not know what is causing your belly pain. °· You have belly pain while you are sick to your stomach (nauseous) or have runny poop (diarrhea). °· You have pain while you pee or poop. °· Your belly pain wakes you up at night. °· You have belly pain that gets worse or better when you eat. °· You have belly pain that gets worse when you eat fatty foods. °· You have a fever. °GET HELP RIGHT AWAY IF:  °· The pain does not go away within 2 hours. °· You keep throwing up (vomiting). °· The pain changes and is only in the right or left part of the belly. °· You have bloody or tarry looking poop. °MAKE SURE YOU:  °· Understand these instructions. °· Will watch your condition. °· Will get help right away if you are not doing well or get worse. °  °This information is not intended to replace advice given to you by your health care provider. Make sure you discuss any questions you have with your health care provider. °  °Document Released: 04/28/2008 Document Revised: 12/01/2014 Document Reviewed: 07/20/2013 °Elsevier Interactive Patient Education ©2016 [employer] ° °Constipation, Adult °Constipation is when a person: °· Poops (has a bowel movement) less than 3 times a week. °· Has a hard time pooping. °· Has poop that is dry, hard, or bigger than normal. °HOME CARE  °· Eat foods with a lot of fiber in them. This includes fruits, vegetables, beans, and whole grains such as brown rice. °· Avoid fatty foods and foods with a lot of sugar. This includes french fries, hamburgers, cookies,  candy, and soda. °· If you are not getting enough fiber from food, take products with added fiber in them (supplements). °· Drink enough fluid to keep your pee (urine) clear or pale yellow. °· Exercise on a regular basis, or as told by your doctor. °· Go to the restroom when you feel like you need to poop. Do not hold it. °· Only take medicine as told by your doctor. Do not take medicines that help you poop (laxatives) without talking to your doctor first. °GET HELP RIGHT AWAY IF:  °· You have bright red blood in your poop (stool). °· Your constipation lasts more than 4 days or gets worse. °· You have belly (abdominal) or butt (rectal) pain. °· You have thin poop (as thin as a pencil). °· You lose weight, and it cannot be explained. °MAKE SURE YOU:  °· Understand these instructions. °· Will watch your condition. °· Will get help right away if you are not doing well or get worse. °  °This information is not intended to replace advice given to you by your health care provider. Make sure you discuss any questions you have with your health care provider. °  °Document Released: 04/28/2008 Document Revised: 12/01/2014 Document Reviewed: 08/22/2013 °Elsevier Interactive Patient Education ©2016 [employer] Inc[employer] ° °

## 2016-05-04 NOTE — ED Notes (Signed)
Patient reports discharged from hospital Saturday afternoon after given birth by C-section.  Patient here for swelling in her legs and feet.  Patient also concerned she may have taken too much Tylenol for pain.  Patient reports took Percocet with 650mg  of Tylenol at 2pm and then took Tylenol ES 1000mg  at 8pm.

## 2016-07-04 ENCOUNTER — Ambulatory Visit (INDEPENDENT_AMBULATORY_CARE_PROVIDER_SITE_OTHER): Payer: BLUE CROSS/BLUE SHIELD | Admitting: Family Medicine

## 2016-07-04 ENCOUNTER — Encounter: Payer: Self-pay | Admitting: Family Medicine

## 2016-07-04 VITALS — BP 108/64 | HR 89 | Temp 98.1°F | Resp 18 | Ht 65.0 in | Wt 181.4 lb

## 2016-07-04 DIAGNOSIS — J029 Acute pharyngitis, unspecified: Secondary | ICD-10-CM | POA: Diagnosis not present

## 2016-07-04 DIAGNOSIS — Z8249 Family history of ischemic heart disease and other diseases of the circulatory system: Secondary | ICD-10-CM | POA: Insufficient documentation

## 2016-07-04 DIAGNOSIS — E46 Unspecified protein-calorie malnutrition: Secondary | ICD-10-CM | POA: Diagnosis not present

## 2016-07-04 DIAGNOSIS — D509 Iron deficiency anemia, unspecified: Secondary | ICD-10-CM | POA: Diagnosis not present

## 2016-07-04 DIAGNOSIS — J36 Peritonsillar abscess: Secondary | ICD-10-CM

## 2016-07-04 LAB — CBC
HCT: 40.1 % (ref 35.0–45.0)
Hemoglobin: 12.9 g/dL (ref 11.7–15.5)
MCH: 27.9 pg (ref 27.0–33.0)
MCHC: 32.2 g/dL (ref 32.0–36.0)
MCV: 86.8 fL (ref 80.0–100.0)
MPV: 8.2 fL (ref 7.5–12.5)
PLATELETS: 269 10*3/uL (ref 140–400)
RBC: 4.62 MIL/uL (ref 3.80–5.10)
RDW: 13.6 % (ref 11.0–15.0)
WBC: 3.5 10*3/uL — AB (ref 3.8–10.8)

## 2016-07-04 LAB — COMPLETE METABOLIC PANEL WITH GFR
ALBUMIN: 4.1 g/dL (ref 3.6–5.1)
ALK PHOS: 76 U/L (ref 33–115)
ALT: 26 U/L (ref 6–29)
AST: 22 U/L (ref 10–30)
BILIRUBIN TOTAL: 0.3 mg/dL (ref 0.2–1.2)
BUN: 10 mg/dL (ref 7–25)
CALCIUM: 9.5 mg/dL (ref 8.6–10.2)
CO2: 27 mmol/L (ref 20–31)
CREATININE: 0.76 mg/dL (ref 0.50–1.10)
Chloride: 106 mmol/L (ref 98–110)
GFR, Est Non African American: >89 mL/min (ref >=60)
Glucose, Bld: 84 mg/dL (ref 65–99)
Potassium: 4.6 mmol/L (ref 3.5–5.3)
Sodium: 141 mmol/L (ref 135–146)
TOTAL PROTEIN: 7.1 g/dL (ref 6.1–8.1)

## 2016-07-04 LAB — FERRITIN: Ferritin: 19 ng/mL (ref 10–154)

## 2016-07-04 MED ORDER — AMOXICILLIN-POT CLAVULANATE 875-125 MG PO TABS: 1.0000 | ORAL_TABLET | Freq: Two times a day (BID) | ORAL | 0 refills | Status: DC

## 2016-07-04 NOTE — Progress Notes (Signed)
Name: Katie Hays   MRN: [id number]    DOB: 18-Sep-1981   Date:07/04/2016       Progress Note  Subjective  Chief Complaint  Chief Complaint  Patient presents with  . Sore Throat    Onset-3 weeks, constant on left side of throat. Patient has been taking allergy medications and gargling with salt water. Patient states her throat feels tender and it is hard to swallow now.     HPI  She states that symptoms started a few weeks ago, and is getting progressively worse. Initially she thought it was allergies but is not improved and now it is localized on left side. She has two children at home ( 45 week old and 42 yo son ) but states nobody else is sick. Pain is worse when talking or swallowing, no fever, no rashes.    Patient Active Problem List   Diagnosis Date Noted  . MVP (mitral valve prolapse) 10/28/2015  . Migraine headache 10/25/2015  . 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 H/O cesarean section 12/02/2012  . History of blood transfusion 12/02/2012    Past Surgical History:  Procedure Laterality Date  . CESAREAN SECTION  04/27/2016   04/16/2012    Family History  Problem Relation Age of Onset  . Heart disease Father   . Diabetes Father   . Asthma Sister   . Hypertension Sister   . Diabetes Sister   . Asthma Brother   . Heart attack Brother   . Asthma Sister   . Hypertension Sister     Social History   Social History  . Marital status: Married    Spouse name: N/A  . Number of children: N/A  . 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 Smoking status: Never Smoker  . Smokeless tobacco: Never Used  . Alcohol use No  . Drug use: No  . Sexual activity: Yes    Partners: Male    Birth control/ protection: Other-see comments     Comment: Tubal Ligation   Other Topics Concern  . 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  cetirizine (ZYRTEC) 10 MG tablet, Take 10 mg by mouth daily., Disp: , Rfl:  .  Prenatal Vit-Fe Fumarate-FA (PRENATAL MULTIVITAMIN) TABS tablet, Take 1 tablet by mouth daily at 12 noon., Disp: , Rfl:   Allergies  Allergen Reactions  . Dilaudid [Hydromorphone Hcl] Swelling     ROS  Ten systems reviewed and is negative except as mentioned in HPI   Objective  Vitals:   07/04/16 1033  BP: 108/64  Pulse: 89  Resp: 18  Temp: 98.1 F (36.7 C)  TempSrc: Oral  SpO2: 98%  Weight: 181 lb 6.4 oz (82.3 kg)  Height: '5\' 5"'$  (1.651 m)    Body mass index is 30.19 kg/m.  Physical Exam  Constitutional: Patient appears well-developed and well-nourished.  No distress.  HEENT: head atraumatic, normocephalic, pupils equal and reactive to light, ears TM normal ,neck supple, left tonsil is bigger than right and has large exudate and erythema  Cardiovascular: Normal rate, regular rhythm and normal heart sounds.  No murmur heard. No BLE edema. Pulmonary/Chest: Effort normal and breath sounds normal. No respiratory distress. Abdominal: Soft.  There is no tenderness.  Psychiatric: Patient has a normal mood and affect. behavior is normal. Judgment and thought content normal.  Recent Results (from the past 2160 hour(s))  CBC     Status: Abnormal   Collection Time: 05/04/16  6:33 AM  Result Value Ref Range   WBC 4.5 3.6 - 11.0 K/uL   RBC 3.89 3.80 - 5.20 MIL/uL   Hemoglobin 11.4 (L) 12.0 - 16.0 g/dL   HCT 34.9 (L) 35.0 - 47.0 %   MCV 89.8 80.0 - 100.0 fL   MCH 29.4 26.0 - 34.0 pg   MCHC 32.8 32.0 - 36.0 g/dL   RDW 13.9 11.5 - 14.5 %   Platelets 251 150 - 440 K/uL  Basic metabolic panel     Status: None   Collection Time: 05/04/16  6:33 AM  Result Value Ref Range   Sodium 138 135 - 145 mmol/L   Potassium 4.0 3.5 - 5.1 mmol/L   Chloride 107 101 - 111 mmol/L   CO2 25 22 - 32 mmol/L   Glucose, Bld 83 65 - 99 mg/dL    BUN 7 6 - 20 mg/dL   Creatinine, Ser 0.72 0.44 - 1.00 mg/dL   Calcium 9.0 8.9 - 10.3 mg/dL   GFR calc non Af Amer >60 >60 mL/min   GFR calc Af Amer >60 >60 mL/min    Comment: (NOTE) The eGFR has been calculated using the CKD EPI equation. This calculation has not been validated in all clinical situations. eGFR's persistently <60 mL/min signify possible Chronic Kidney Disease.    Anion gap 6 5 - 15  Hepatic function panel     Status: Abnormal   Collection Time: 05/04/16  6:33 AM  Result Value Ref Range   Total Protein 6.5 6.5 - 8.1 g/dL   Albumin 2.9 (L) 3.5 - 5.0 g/dL   AST 41 15 - 41 U/L   ALT 32 14 - 54 U/L   Alkaline Phosphatase 153 (H) 38 - 126 U/L   Total Bilirubin 0.5 0.3 - 1.2 mg/dL   Bilirubin, Direct NOT CALCULATED 0.1 - 0.5 mg/dL   Indirect Bilirubin NOT CALCULATED 0.3 - 0.9 mg/dL  Urinalysis complete, with microscopic ([facility] only)     Status: Abnormal   Collection Time: 05/04/16  6:33 AM  Result Value Ref Range   Color, Urine STRAW (A) YELLOW   APPearance CLEAR (A) CLEAR   Glucose, UA NEGATIVE NEGATIVE mg/dL   Bilirubin Urine NEGATIVE NEGATIVE   Ketones, ur NEGATIVE NEGATIVE mg/dL   Specific Gravity, Urine 1.009 1.005 - 1.030   Hgb urine dipstick 3+ (A) NEGATIVE   pH 8.0 5.0 - 8.0   Protein, ur NEGATIVE NEGATIVE mg/dL   Nitrite NEGATIVE NEGATIVE   Leukocytes, UA NEGATIVE NEGATIVE   RBC / HPF TOO NUMEROUS TO COUNT 0 - 5 RBC/hpf   WBC, UA 0-5 0 - 5 WBC/hpf   Bacteria, UA RARE (A) NONE SEEN   Squamous Epithelial / LPF 0-5 (A) NONE SEEN   Mucous PRESENT      PHQ2/9: Depression screen [facility] 2/9 07/04/2016  Decreased Interest 0  Down, Depressed, Hopeless 0  PHQ - 2 Score 0    Fall Risk: Fall Risk  07/04/2016  Falls in the past year? No     Functional Status Survey: Is the patient deaf or have difficulty hearing?: No Does the patient have difficulty seeing, even when wearing glasses/contacts?: No Does the patient have difficulty concentrating, remembering,  or making decisions?: No Does the patient have difficulty walking or  climbing stairs?: No Does the patient have difficulty dressing or bathing?: No Does the patient have difficulty doing errands alone such as visiting a doctor's office or shopping?: No   Assessment & Plan  1. Sore throat   2. Peritonsillar abscess  Please call back if no improvement of symptoms for referral to ENT, continue gargling with warm salted water, may take Tylenol for pain . May continue breast feeding - amoxicillin-clavulanate (AUGMENTIN) 875-125 MG tablet; Take 1 tablet by mouth 2 (two) times daily.  Dispense: 20 tablet; Refill: 0  3. Anemia, iron deficiency  Labs done during delivery, recheck levels - CBC - Ferritin  4. Malnutrition ([place])  Recheck labs, she feels tired, still taking pre-natal vitamins, discussed importance of eating more protein - COMPLETE METABOLIC PANEL WITH GFR - CBC

## 2016-12-24 ENCOUNTER — Ambulatory Visit (INDEPENDENT_AMBULATORY_CARE_PROVIDER_SITE_OTHER): Payer: BLUE CROSS/BLUE SHIELD | Admitting: Family Medicine

## 2016-12-24 ENCOUNTER — Encounter: Payer: Self-pay | Admitting: Family Medicine

## 2016-12-24 VITALS — BP 104/68 | HR 116 | Temp 98.0°F | Resp 16 | Ht 65.0 in | Wt 180.2 lb

## 2016-12-24 DIAGNOSIS — R6889 Other general symptoms and signs: Secondary | ICD-10-CM | POA: Diagnosis not present

## 2016-12-24 DIAGNOSIS — Z20828 Contact with and (suspected) exposure to other viral communicable diseases: Secondary | ICD-10-CM

## 2016-12-24 MED ORDER — OSELTAMIVIR PHOSPHATE 75 MG PO CAPS: 75.0000 mg | ORAL_CAPSULE | Freq: Two times a day (BID) | ORAL | 0 refills | Status: DC

## 2016-12-24 MED ORDER — BENZONATATE 100 MG PO CAPS: 100.0000 mg | ORAL_CAPSULE | Freq: Three times a day (TID) | ORAL | 0 refills | Status: DC | PRN

## 2016-12-24 NOTE — Progress Notes (Signed)
Name: Katie Hays   MRN: [id number]    DOB: Aug 15, 1981   Date:12/24/2016       Progress Note  Subjective  Chief Complaint  Chief Complaint  Patient presents with  . Influenza    patient presents today with flu-like sx since Sunday. both kids tested (+) for flu. OTC meds: Tylenol cold/ flu, Thera flu, thera flu express max.  . Fever  . Nasal Congestion    green mucus  . Vomiting  . bodyaches  . sneezing  . Cough    HPI  Flu like symptoms: Both of her kids were diagnosed with flu last Friday, she developed rhinorrhea, nasal congestion, high fever, body aches, fatigue, nausea and vomiting two days later. She has been taking otc medication without much help. Vomiting and fever is coming down. She missed work all week. She states the dry cough is bothering her. Explained Tamiflu works best when given within 24-48 hours but she would like to have the prescription.    Patient Active Problem List   Diagnosis Date Noted  . Family history of heart disease in female family member before age 38 07/04/2016  . MVP (mitral valve prolapse) 10/28/2015  . Migraine headache 10/25/2015  . 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 H/O cesarean section 12/02/2012  . History of blood transfusion 12/02/2012    Past Surgical History:  Procedure Laterality Date  . CESAREAN SECTION  04/27/2016   04/16/2012    Family History  Problem Relation Age of Onset  . Heart disease Father   . Diabetes Father   . Asthma Sister   . Hypertension Sister   . Diabetes Sister   . Asthma Brother   . Heart attack Brother   . Asthma Sister   . Hypertension Sister     Social History   Social History  . Marital status: Married    Spouse name: N/A  . Number of children: N/A  . 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 Smoking status: Never Smoker  . Smokeless tobacco: Never Used  . Alcohol use No  . Drug  use: No  . Sexual activity: Yes    Partners: Male    Birth control/ protection: Other-see comments     Comment: Tubal Ligation   Other Topics Concern  . 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  cetirizine (ZYRTEC) 10 MG tablet, Take 10 mg by mouth daily., Disp: , Rfl:  .  MICROGESTIN 1.5-30 MG-MCG tablet, , Disp: , Rfl:  .  Prenatal Vit-Fe Fumarate-FA (PRENATAL MULTIVITAMIN) TABS tablet, Take 1 tablet by mouth daily at 12 noon., Disp: , Rfl:   Allergies  Allergen Reactions  . Dilaudid [Hydromorphone Hcl] Swelling     ROS  Ten systems reviewed and is negative except as mentioned in HPI   Objective  Vitals:   12/24/16 1302  BP: 104/68  Pulse: (!) 116  Resp: 16  Temp: 98 F (36.7 C)  TempSrc: Oral  SpO2: 97%  Weight: 180 lb 3.2 oz (81.7 kg)  Height: 5\' 5"  (1.651 m)    Body mass index is 29.99 kg/m.  Physical Exam  Constitutional: Patient appears well-developed and well-nourished. Obese  No distress.  HEENT: head atraumatic, normocephalic, pupils equal and reactive  to light, ears normal TM, boggy turbinates, neck supple, throat within normal limits Cardiovascular: Normal rate, regular rhythm and normal heart sounds.  No murmur heard. No BLE edema. Pulmonary/Chest: Effort normal and breath sounds normal. No respiratory distress. Abdominal: Soft.  There is no tenderness. Psychiatric: Patient has a normal mood and affect. behavior is normal. Judgment and thought content normal.  PHQ2/9: Depression screen PHQ 2/9 07/04/2016  Decreased Interest 0  Down, Depressed, Hopeless 0  PHQ - 2 Score 0    Fall Risk: Fall Risk  07/04/2016  Falls in the past year? No    Assessment & Plan  1. Exposure to the flu  - oseltamivir (TAMIFLU) 75 MG capsule; Take 1 capsule (75 mg total) by mouth 2 (two) times daily.  Dispense: 10 capsule; Refill: 0  2. Flu-like  symptoms  - oseltamivir (TAMIFLU) 75 MG capsule; Take 1 capsule (75 mg total) by mouth 2 (two) times daily.  Dispense: 10 capsule; Refill: 0 - benzonatate (TESSALON) 100 MG capsule; Take 1-2 capsules (100-200 mg total) by mouth 3 (three) times daily as needed for cough.  Dispense: 40 capsule; Refill: 0

## 2016-12-30 ENCOUNTER — Telehealth: Payer: Self-pay | Admitting: Family Medicine

## 2016-12-30 NOTE — Telephone Encounter (Signed)
Pt states she was told if she needed an extension on her work note for her to call. Pt is requesting her note to be extended until Friday, returning to work on Monday. Pt still is not feeling well. Please advise.

## 2016-12-31 NOTE — Telephone Encounter (Signed)
Patient is starting to feel better. We do not have any open appointments for patient. Per Dr. Carlynn PurlSowles ok to extend work note and faxed to patient's employer.

## 2017-02-11 ENCOUNTER — Ambulatory Visit (INDEPENDENT_AMBULATORY_CARE_PROVIDER_SITE_OTHER): Payer: BLUE CROSS/BLUE SHIELD | Admitting: Family Medicine

## 2017-02-11 ENCOUNTER — Encounter: Payer: Self-pay | Admitting: Family Medicine

## 2017-02-11 ENCOUNTER — Other Ambulatory Visit: Payer: Self-pay | Admitting: Family Medicine

## 2017-02-11 VITALS — BP 106/76 | HR 102 | Temp 98.5°F | Resp 16 | Ht 65.0 in | Wt 179.3 lb

## 2017-02-11 DIAGNOSIS — Z13220 Encounter for screening for lipoid disorders: Secondary | ICD-10-CM

## 2017-02-11 DIAGNOSIS — Z01419 Encounter for gynecological examination (general) (routine) without abnormal findings: Secondary | ICD-10-CM

## 2017-02-11 DIAGNOSIS — Z124 Encounter for screening for malignant neoplasm of cervix: Secondary | ICD-10-CM | POA: Diagnosis not present

## 2017-02-11 DIAGNOSIS — R87810 Cervical high risk human papillomavirus (HPV) DNA test positive: Secondary | ICD-10-CM | POA: Diagnosis not present

## 2017-02-11 DIAGNOSIS — R5383 Other fatigue: Secondary | ICD-10-CM

## 2017-02-11 DIAGNOSIS — Z131 Encounter for screening for diabetes mellitus: Secondary | ICD-10-CM | POA: Diagnosis not present

## 2017-02-11 DIAGNOSIS — Z862 Personal history of diseases of the blood and blood-forming organs and certain disorders involving the immune mechanism: Secondary | ICD-10-CM

## 2017-02-11 NOTE — Progress Notes (Signed)
Name: Katie Hays   MRN: [id number]    DOB: [birth year]   Date:02/11/2017       Progress Note  Subjective  Chief Complaint  Chief Complaint  Patient presents with  . Annual Exam    HPI  Well woman; no pain during intercourse, no vaginal discharge, cycles are regular lasts 4-5 days and not very heavy, s/p tubal ligation 04/2016. She has been feeling more tired than usual lately, but has two children under 39 years old. History of HPV positive test.    Patient Active Problem List   Diagnosis Date Noted  . Family history of heart disease in female family member before age 71 07/04/2016  . MVP (mitral valve prolapse) 10/28/2015  . Migraine headache 10/25/2015  . 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 H/O cesarean section 12/02/2012  . History of blood transfusion 12/02/2012    Past Surgical History:  Procedure Laterality Date  . CESAREAN SECTION  04/27/2016   04/16/2012    Family History  Problem Relation Age of Onset  . Heart disease Father   . Diabetes Father   . Asthma Sister   . Hypertension Sister   . Diabetes Sister   . Asthma Brother   . Heart attack Brother   . Asthma Sister   . Hypertension Sister     Social History   Social History  . Marital status: Married    Spouse name: Moses  . Number of children: 2  . 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 Smoking status: Never Smoker  . Smokeless tobacco: Never Used  . Alcohol use No  . Drug use: No  . Sexual activity: Yes    Partners: Male    Birth control/ protection: Other-see comments     Comment: Tubal Ligation   Other Topics Concern  . 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  cetirizine (ZYRTEC) 10 MG tablet, Take 10 mg by mouth daily., Disp: , Rfl:    Allergies  Allergen Reactions  . Dilaudid [Hydromorphone Hcl] Swelling     ROS  Constitutional: Negative for fever or weight change.  Respiratory: Negative for cough and shortness of breath.   Cardiovascular: Negative for chest pain or palpitations.  Gastrointestinal: Negative for abdominal pain, no bowel changes.  Musculoskeletal: Negative for gait problem or joint swelling.  Skin: Negative for rash.  Neurological: Negative for dizziness or headache.  No other specific complaints in a complete review of systems (except as listed in HPI above).  Objective  Vitals:   02/11/17 1405  BP: 106/76  Pulse: (!) 102  Resp: 16  Temp: 98.5 F (36.9 C)  SpO2: 99%  Weight: 179 lb 5 oz (81.3 kg)  Height: 5\' 5"  (1.651 m)    Body mass index is 29.84 kg/m.  Physical Exam  Constitutional: Patient appears well-developed and well-nourished. No distress.  HENT: Head: Normocephalic and atraumatic. Ears: B TMs ok, no erythema or effusion; Nose: Nose normal. Mouth/Throat: Oropharynx is clear and moist. No oropharyngeal exudate.  Eyes: Conjunctivae and EOM are normal. Pupils are equal, round, and reactive to light. No scleral icterus.  Neck: Normal range of motion. Neck supple. No JVD present. No thyromegaly present.  Cardiovascular: Normal  rate, regular rhythm and normal heart sounds.  No murmur heard. No BLE edema. Pulmonary/Chest: Effort normal and breath sounds normal. No respiratory distress. Abdominal: Soft. Bowel sounds are normal, no distension. There is no tenderness. no masses Breast: no lumps or masses, no nipple discharge or rashes FEMALE GENITALIA:  External genitalia normal External urethra normal Vaginal vault normal without discharge or lesions Cervix normal without discharge or lesions Bimanual exam normal without masses RECTAL: not done Musculoskeletal: Normal range of motion, no joint effusions. No gross deformities Neurological: he is alert and oriented to person,  place, and time. No cranial nerve deficit. Coordination, balance, strength, speech and gait are normal.  Skin: Skin is warm and dry. No rash noted. No erythema.  Psychiatric: Patient has a normal mood and affect. behavior is normal. Judgment and thought content normal.  PHQ2/9: [facility] screen [facility] 02/11/2017 07/04/2016  Decreased Interest 0 0  Down, Depressed, Hopeless 0 0  PHQ - 2 Score 0 0     Fall Risk: Fall Risk  07/04/2016  Falls in the past year? No    Current Exercise Habits: Structured exercise class, Type of exercise: calisthenics;strength training/weights;Other - see comments, Frequency (Times/Week): 3, Intensity: Mild    Assessment & Plan  1. Well woman exam  Discussed importance of 150 minutes of physical activity weekly, eat two servings of fish weekly, eat one serving of tree nuts ( cashews, pistachios, pecans, almonds.Marland Kitchen.) every other day, eat 6 servings of fruit/vegetables daily and drink plenty of water and avoid sweet beverages.   2. Cervical cancer screening  - Pap IG and HPV (high risk) DNA detection  3. Cervical high risk HPV (human papillomavirus) test positive  - Pap IG and HPV (high risk) DNA detection  4. Lipid screening  - Lipid panel  5. History of anemia  Recheck CBC  6. Diabetes mellitus screening  - Hemoglobin A1c - Insulin, fasting  7. Other fatigue  - COMPLETE METABOLIC PANEL WITH GFR - CBC with Differential/Platelet - VITAMIN D 25 Hydroxy (Vit-D Deficiency, Fractures) - Vitamin B12 - TSH

## 2017-02-16 LAB — PAP IG AND HPV HIGH-RISK: HPV DNA High Risk: NOT DETECTED

## 2017-11-10 ENCOUNTER — Ambulatory Visit: Payer: BLUE CROSS/BLUE SHIELD | Admitting: Family Medicine

## 2017-11-10 ENCOUNTER — Encounter: Payer: Self-pay | Admitting: Family Medicine

## 2017-11-10 VITALS — BP 90/62 | HR 61 | Resp 12 | Ht 65.0 in | Wt 163.0 lb

## 2017-11-10 DIAGNOSIS — M25571 Pain in right ankle and joints of right foot: Secondary | ICD-10-CM

## 2017-11-10 DIAGNOSIS — R634 Abnormal weight loss: Secondary | ICD-10-CM

## 2017-11-10 DIAGNOSIS — Z23 Encounter for immunization: Secondary | ICD-10-CM | POA: Diagnosis not present

## 2017-11-10 DIAGNOSIS — L989 Disorder of the skin and subcutaneous tissue, unspecified: Secondary | ICD-10-CM

## 2017-11-10 NOTE — Progress Notes (Signed)
Name: Katie Hays   MRN: [id number]    DOB: 08-22-1981   Date:11/10/2017       Progress Note  Subjective  Chief Complaint  Chief Complaint  Patient presents with  . Foot Pain    left    HPI  Right foot pain: she rolled her foot while wearing high heels about one month ago, she sates initially unable to bear weight and had swelling of right lateral foot, no bruise or redness, she rested, iced, decreased exercised, tried a brace but still has symptoms. Not as severe but still has lateral foot pain and point tenderness below right malleolus, aggravated by certain movements  Lesion left foot: she noticed a tender raised spot left medial foot for the past two weeks, it does not drain but it is painful now.   Weight loss: she has been working out three times a week with a Systems analystpersonal trainer, she is also eating healthier, thinking about getting pregnant again ( discussed it already with obgyn)   Patient Active Problem List   Diagnosis Date Noted  . Family history of heart disease in female family member before age 655 07/04/2016  . MVP (mitral valve prolapse) 10/28/2015  . Migraine headache 10/25/2015  . 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 H/O cesarean section 12/02/2012  . History of blood transfusion 12/02/2012    Past Surgical History:  Procedure Laterality Date  . CESAREAN SECTION  04/30/2016   04/16/2012  . TUBAL LIGATION Bilateral 04/30/2016    Family History  Problem Relation Age of Onset  . Heart disease Father   . Diabetes Father   . Asthma Sister   . Hypertension Sister   . Diabetes Sister   . Asthma Brother   . Heart attack Brother   . Asthma Sister   . Hypertension Sister     Social History   Socioeconomic History  . Marital status: Married    Spouse name: Moses  . Number of children: 2  . Years of education: Not on file  . Highest education level: Not on file  Social Needs  . Financial resource strain: Not on file   . Food insecurity - worry: Not on file  . Food insecurity - inability: Not on file  . Transportation needs - medical: Not on file  . Transportation needs - non-medical: Not on file  Occupational History  . Occupation: real state appraisal  Tobacco Use  . Smoking status: Never Smoker  . Smokeless tobacco: Never Used  Substance and Sexual Activity  . Alcohol use: No  . Drug use: No  . Sexual activity: Yes    Partners: Male    Birth control/protection: Other-see comments    Comment: Tubal Ligation  Other Topics Concern  . 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  cetirizine (ZYRTEC) 10 MG tablet, Take 10 mg by mouth daily., Disp: , Rfl:   Allergies  Allergen Reactions  . Dilaudid [Hydromorphone Hcl] Swelling     ROS  Ten systems reviewed and is negative except as mentioned in HPI   Objective  Vitals:   11/10/17 1534  BP: 90/62  Pulse: 61  Resp: 12  SpO2: 99%  Weight: 163 lb (73.9 kg)  Height: 5\' 5"  (1.651 m)    Body mass index is 27.12 kg/m.  Physical Exam  Constitutional: Patient appears well-developed and overweight. No distress.  HEENT: head atraumatic, normocephalic, pupils equal and reactive to light,neck supple, throat within normal limits Cardiovascular: Normal rate, regular rhythm and normal heart sounds.  No murmur heard. No BLE edema. Pulmonary/Chest: Effort normal and breath sounds normal. No respiratory distress. Abdominal: Soft.  There is no tenderness. Skin: tender lesion on left medial foot, no drainage, hyperpigmentation  Psychiatric: Patient has a normal mood and affect. behavior is normal. Judgment and thought content normal. Muscular Skeletal: pain during palpation of lower aspect of right lateral malleolus of right foot, some pain on right lateral foot with passive rom of right foot  PHQ2/9: Depression  screen [facility] 02/11/2017 07/04/2016  Decreased Interest 0 0  Down, Depressed, Hopeless 0 0  PHQ - 2 Score 0 0     Fall Risk: Fall Risk  11/10/2017 07/04/2016  Falls in the past year? No No     Functional Status Survey: Is the patient deaf or have difficulty hearing?: No Does the patient have difficulty seeing, even when wearing glasses/contacts?: No Does the patient have difficulty concentrating, remembering, or making decisions?: No Does the patient have difficulty walking or climbing stairs?: No Does the patient have difficulty dressing or bathing?: No Does the patient have difficulty doing errands alone such as visiting a doctor's office or shopping?: No    Assessment & Plan  1. Acute right ankle pain  - Ambulatory referral to Podiatry  2. Flu vaccine need  - Flu Vaccine QUAD 36+ mos IM  3. Skin lesion of foot  - Ambulatory referral to Podiatry  4. Weight loss  She is very active again, she has a Systems analystpersonal trainer, slow weight loss

## 2017-11-27 ENCOUNTER — Ambulatory Visit (INDEPENDENT_AMBULATORY_CARE_PROVIDER_SITE_OTHER): Payer: Managed Care, Other (non HMO)

## 2017-11-27 ENCOUNTER — Other Ambulatory Visit: Payer: Self-pay | Admitting: Podiatry

## 2017-11-27 ENCOUNTER — Ambulatory Visit: Payer: Self-pay | Admitting: Podiatry

## 2017-11-27 ENCOUNTER — Encounter: Payer: Self-pay | Admitting: Podiatry

## 2017-11-27 DIAGNOSIS — L720 Epidermal cyst: Secondary | ICD-10-CM

## 2017-11-27 DIAGNOSIS — M25571 Pain in right ankle and joints of right foot: Secondary | ICD-10-CM

## 2017-11-27 DIAGNOSIS — M659 Synovitis and tenosynovitis, unspecified: Secondary | ICD-10-CM | POA: Diagnosis not present

## 2017-11-27 DIAGNOSIS — M25572 Pain in left ankle and joints of left foot: Secondary | ICD-10-CM

## 2017-11-27 NOTE — Progress Notes (Signed)
   Subjective:    Patient ID: Katie Hays, female    DOB: 09-27-1981, 37 y.o.   MRN: [id number]  HPI    Review of Systems  All other systems reviewed and are negative.      Objective:   Physical Exam        Assessment & Plan:

## 2017-11-27 NOTE — Progress Notes (Signed)
HPI: 37 year old healthy female presents the office today for 2 complaints.  She presents today as a new patient complaining of right ankle pain for the past 3 months.  Patient states that back in October she rolled her ankle and she has been experiencing pain and tenderness to the ankle joint ever since.  Patient also states that for the past month she noticed lesion to the medial aspect of her left foot.  Patient denies trauma.  She went to the primary care physician who referred her here.  The lesion is dark in coloration.  She presents today for further treatment and evaluation.  Past Medical History:  Diagnosis Date  . Anemia   . History of meningitis   . Pericardial effusion      Physical Exam: General: The patient is alert and oriented x3 in no acute distress.  Dermatology: Hyperpigmented skin lesion noted to the medial aspect of the left foot.  There is a central pore to this lesion that appears to have some white discoloration.  The skin lesion is hyper keratotic and circular in appearance.  Uniform coloration.  The lesion appears approximately 2 cm in diameter.  There is no drainage. skin is warm, dry and supple bilateral lower extremities. Negative for open lesions or macerations.  Vascular: Palpable pedal pulses bilaterally. No edema or erythema noted. Capillary refill within normal limits.  Neurological: Epicritic and protective threshold grossly intact bilaterally.   Musculoskeletal Exam: Pain on palpation noted to the lateral aspect of the patient's right ankle joint consistent with a right ankle sprain ATF ligament.  Range of motion within normal limits to all pedal and ankle joints bilateral. Muscle strength 5/5 in all groups bilateral.   Radiographic Exam:  Normal osseous mineralization. Joint spaces preserved. No fracture/dislocation/boney destruction.    Assessment: -Synovitis of right ankle secondary to ankle sprain. DOI: October 2018 -Skin lesion consistent with an  epidermal inclusion cyst left medial foot   Plan of Care:  -The patient was evaluated today.  X-rays reviewed today. -Injection of 0.5 cc Celestone Soluspan injection of the right ankle joint lateral aspect -Today I gave the patient the option of wearing an immobilization cam boot (recommended) versus a good supportive ankle brace.  Patient states that she would likely not wear the immobilization cam boot to her right ankle.  Today we are going to opt for supportive ankle brace.  Ankle brace was dispensed today -Prescription for meloxicam 15 mg -The epidermal inclusion cyst was significantly painful with palpation and manipulation.  Local anesthesia infiltration was utilized consisting of 2% lidocaine with epinephrine to anesthetize the area.  At that time excisional debridement was performed of the epidermal inclusion cyst using a tissue nipper.  All hyperkeratotic skin tissue was debrided.  Dry sterile dressing was applied.  I instructed the patient to apply antibiotic ointment and Band-Aid daily -Return to clinic in 2 weeks.  If the epidermal inclusion cyst lesion is not improved we [place] [facility] [place] [facility] [place] portion and sent to pathology.   Felecia ShellingBrent M. Sayeed Weatherall, DPM [facility]  Dr. Felecia ShellingBrent M. Nikhil Osei, DPM    [address]                                        [place], [place] [id number]  Office [phone]  Fax [phone]

## 2017-12-02 ENCOUNTER — Encounter: Payer: Self-pay | Admitting: Family Medicine

## 2017-12-02 ENCOUNTER — Ambulatory Visit (INDEPENDENT_AMBULATORY_CARE_PROVIDER_SITE_OTHER): Payer: Managed Care, Other (non HMO) | Admitting: Family Medicine

## 2017-12-02 VITALS — BP 114/68 | HR 68 | Temp 98.3°F | Resp 16 | Ht 66.0 in | Wt 159.2 lb

## 2017-12-02 DIAGNOSIS — Z131 Encounter for screening for diabetes mellitus: Secondary | ICD-10-CM

## 2017-12-02 DIAGNOSIS — Z01419 Encounter for gynecological examination (general) (routine) without abnormal findings: Secondary | ICD-10-CM

## 2017-12-02 DIAGNOSIS — Z124 Encounter for screening for malignant neoplasm of cervix: Secondary | ICD-10-CM

## 2017-12-02 DIAGNOSIS — Z862 Personal history of diseases of the blood and blood-forming organs and certain disorders involving the immune mechanism: Secondary | ICD-10-CM | POA: Diagnosis not present

## 2017-12-02 DIAGNOSIS — Z01411 Encounter for gynecological examination (general) (routine) with abnormal findings: Secondary | ICD-10-CM

## 2017-12-02 DIAGNOSIS — Z13220 Encounter for screening for lipoid disorders: Secondary | ICD-10-CM

## 2017-12-02 DIAGNOSIS — J029 Acute pharyngitis, unspecified: Secondary | ICD-10-CM | POA: Diagnosis not present

## 2017-12-02 DIAGNOSIS — Z113 Encounter for screening for infections with a predominantly sexual mode of transmission: Secondary | ICD-10-CM

## 2017-12-02 LAB — POCT RAPID STREP A (OFFICE): RAPID STREP A SCREEN: NEGATIVE

## 2017-12-02 MED ORDER — FIRST-DUKES MOUTHWASH MT SUSP
15.0000 mL | Freq: Three times a day (TID) | OROMUCOSAL | 0 refills | Status: DC
Start: 2017-12-02 — End: 2018-12-03

## 2017-12-02 NOTE — Progress Notes (Signed)
s mouth  Name: Katie Hays   MRN: [id number]    DOB: 01-15-1981   Date:12/02/2017       Progress Note  Subjective  Chief Complaint  Chief Complaint  Patient presents with  . Annual Exam    HPI   Patient presents for annual CPE and sore throat  Sore Throat: she just flew back from [place] and developed fever 101.9 yesterday, body aches, sore throat, dysphagia, no radiation of pain, no rashes, previous history of peritonsillar abscess and history of strep throat  Diet: balanced diet Exercises three days a week   USPSTF grade A and B recommendations  Depression:  Depression screen [facility] 2/9 12/02/2017 02/11/2017 07/04/2016  Decreased Interest 0 0 0  Down, Depressed, Hopeless 0 0 0  PHQ - 2 Score 0 0 0   Hypertension: BP Readings from Last 3 Encounters:  12/02/17 114/68  11/10/17 90/62  02/11/17 106/76   Obesity: Wt Readings from Last 3 Encounters:  12/02/17 159 lb 3.2 oz (72.2 kg)  11/10/17 163 lb (73.9 kg)  02/11/17 179 lb 5 oz (81.3 kg)   BMI Readings from Last 3 Encounters:  12/02/17 25.70 kg/m  11/10/17 27.12 kg/m  02/11/17 29.84 kg/m     Intimate partner violence:negative screen  Sexual History/Pain during Intercourse: no pain during intercourse, no vaginal discharge Menstrual History/LMP/Abnormal Bleeding: regular cycles, s/p tubes ligation Incontinence Symptoms: denies problems   Advanced Care Planning: A voluntary discussion about advance care planning including the explanation and discussion of advance directives.  Discussed health care proxy and Living will, and the patient was able to identify a health care proxy as husband - Moses   Patient does have a living will at present time. If patient does have living will, I have requested they bring this to the clinic to be scanned in to their chart.  Breast cancer: never had it done BRCA gene screening: not a candidate Cervical cancer screening: 01/2017   Glucose:  Glucose  Date Value Ref Range Status   02/17/2013 88 65 - 99 mg/dL Final  05/13/2012 82 65 - 99 mg/dL Final   Glucose, Bld  Date Value Ref Range Status  07/04/2016 84 65 - 99 mg/dL Final  05/04/2016 83 65 - 99 mg/dL Final  11/07/2015 83 65 - 99 mg/dL Final     Patient Active Problem List   Diagnosis Date Noted  . Family history of heart disease in female family member before age 40 07/04/2016  . MVP (mitral valve prolapse) 10/28/2015  . Migraine headache 10/25/2015  . 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 H/O cesarean section 12/02/2012  . History of blood transfusion 12/02/2012    Past Surgical History:  Procedure Laterality Date  . CESAREAN SECTION  04/30/2016   04/16/2012  . TUBAL LIGATION Bilateral 04/30/2016    Family History  Problem Relation Age of Onset  . Heart disease Father   . Diabetes Father   . Asthma Sister   . Hypertension Sister   . Diabetes Sister   . Asthma Brother   . Heart attack Brother   . Asthma Sister   . Hypertension Sister     Social History   Socioeconomic History  . Marital status: Married    Spouse name: Moses  . Number of children: 2  . Years of education: Not on file  . Highest education level: Not on file  Social Needs  . Financial resource strain: Not on file  . Food insecurity -  worry: Not on file  . Food insecurity - inability: Not on file  . Transportation needs - medical: Not on file  . Transportation needs - non-medical: Not on file  Occupational History  . Occupation: real state appraisal  Tobacco Use  . Smoking status: Never Smoker  . Smokeless tobacco: Never Used  Substance and Sexual Activity  . Alcohol use: No  . Drug use: No  . Sexual activity: Yes    Partners: Male    Birth control/protection: Other-see comments    Comment: Tubal Ligation  Other Topics Concern  . 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  levocetirizine (XYZAL) 5 MG tablet, Take 5 mg by mouth every evening., Disp: , Rfl:  .  cetirizine (ZYRTEC) 10 MG tablet, Take 10 mg by mouth daily., Disp: , Rfl:   Allergies  Allergen Reactions  . Dilaudid [Hydromorphone Hcl] Swelling     ROS   Constitutional: Negative for fever or weight change.  Respiratory: Negative for cough and shortness of breath.   Cardiovascular: Negative for chest pain or palpitations.  Gastrointestinal: Negative for abdominal pain, no bowel changes.  Musculoskeletal: Negative for gait problem or joint swelling.  Skin: Negative for rash, still has bandaid on the area of biopsy of left foot.  Neurological: Negative for dizziness or headache.  No other specific complaints in a complete review of systems (except as listed in HPI above).   Objective  Vitals:   12/02/17 1016  BP: 114/68  Pulse: 68  Resp: 16  Temp: 98.3 F (36.8 C)  TempSrc: Oral  SpO2: 99%  Weight: 159 lb 3.2 oz (72.2 kg)  Height: _0  (1.676 m)    Body mass index is 25.7 kg/m.  Physical Exam  Constitutional: Patient appears well-developed and well-nourished. No distress.  HENT: Head: Normocephalic and atraumatic. Ears: B TMs ok, no erythema or effusion; Nose: Nose normal. Mouth/Throat: Oropharynx is clear and moist. No oropharyngeal exudate.  Eyes: Conjunctivae and EOM are normal. Pupils are equal, round, and reactive to light. No scleral icterus.  Neck: Normal range of motion. Neck supple. No JVD present. No thyromegaly present.  Cardiovascular: Normal rate, regular rhythm and normal heart sounds.  No murmur heard. No BLE edema. Pulmonary/Chest: Effort normal and breath sounds normal. No respiratory distress. Abdominal: Soft. Bowel sounds are normal, no distension. There is no tenderness. no masses Breast: no lumps or masses, no nipple discharge or rashes FEMALE GENITALIA:  External genitalia normal External urethra  normal Pelvic not done RECTAL: not done Musculoskeletal: Normal range of motion, no joint effusions. No gross deformities Neurological: he is alert and oriented to person, place, and time. No cranial nerve deficit. Coordination, balance, strength, speech and gait are normal.  Skin: Skin is warm and dry. No rash noted. No erythema.  Psychiatric: Patient has a normal mood and affect. behavior is normal. Judgment and thought content normal.   PHQ2/9: Depression screen [facility] 2/9 12/02/2017 02/11/2017 07/04/2016  Decreased Interest 0 0 0  Down, Depressed, Hopeless 0 0 0  PHQ - 2 Score 0 0 0    Fall Risk: Fall Risk  12/02/2017 11/10/2017 07/04/2016  Falls in the past year? No No No    Functional Status Survey: Is the patient deaf or have difficulty hearing?: No Does the patient have difficulty seeing, even when  wearing glasses/contacts?: No Does the patient have difficulty concentrating, remembering, or making decisions?: No Does the patient have difficulty walking or climbing stairs?: No Does the patient have difficulty dressing or bathing?: No Does the patient have difficulty doing errands alone such as visiting a doctor's office or shopping?: No   Assessment & Plan  1. Well woman exam  Discussed importance of 150 minutes of physical activity weekly, eat two servings of fish weekly, eat one serving of tree nuts ( cashews, pistachios, pecans, almonds.Marland Kitchen) every other day, eat 6 servings of fruit/vegetables daily and drink plenty of water and avoid sweet beverages.  - COMPLETE METABOLIC PANEL WITH GFR - CBC with Differential/Platelet - HIV antibody - Hemoglobin A1c - Lipid panel - RPR - C. trachomatis/N. gonorrhoeae RNA - Hepatitis, Acute  2. Cervical cancer screening  Up to date, had a normal pap and negative HPV in 2018 - she has new insurance since July 2018   3. Lipid screening  - Lipid panel  4. History of anemia  - CBC with Differential/Platelet  5. Diabetes mellitus  screening  - Hemoglobin A1c  6. Sore throat  - POCT rapid strep A  7. Routine screening for STI (sexually transmitted infection)  - HIV antibody - RPR - C. trachomatis/N. gonorrhoeae RNA - Hepatitis, Acute

## 2017-12-02 NOTE — Patient Instructions (Signed)
Preventive Care 18-39 Years, Female Preventive care refers to lifestyle choices and visits with your health care provider that can promote health and wellness. What does preventive care include?  A yearly physical exam. This is also called an annual well check.  Dental exams once or twice a year.  Routine eye exams. Ask your health care provider how often you should have your eyes checked.  Personal lifestyle choices, including: ? Daily care of your teeth and gums. ? Regular physical activity. ? Eating a healthy diet. ? Avoiding tobacco and drug use. ? Limiting alcohol use. ? Practicing safe sex. ? Taking vitamin and mineral supplements as recommended by your health care provider. What happens during an annual well check? The services and screenings done by your health care provider during your annual well check will depend on your age, overall health, lifestyle risk factors, and family history of disease. Counseling Your health care provider may ask you questions about your:  Alcohol use.  Tobacco use.  Drug use.  Emotional well-being.  Home and relationship well-being.  Sexual activity.  Eating habits.  Work and work Statistician.  Method of birth control.  Menstrual cycle.  Pregnancy history.  Screening You may have the following tests or measurements:  Height, weight, and BMI.  Diabetes screening. This is done by checking your blood sugar (glucose) after you have not eaten for a while (fasting).  Blood pressure.  Lipid and cholesterol levels. These may be checked every 5 years starting at age 66.  Skin check.  Hepatitis C blood test.  Hepatitis B blood test.  Sexually transmitted disease (STD) testing.  BRCA-related cancer screening. This may be done if you have a family history of breast, ovarian, tubal, or peritoneal cancers.  Pelvic exam and Pap test. This may be done every 3 years starting at age 40. Starting at age 59, this may be done every 5  years if you have a Pap test in combination with an HPV test.  Discuss your test results, treatment options, and if necessary, the need for more tests with your health care provider. Vaccines Your health care provider may recommend certain vaccines, such as:  Influenza vaccine. This is recommended every year.  Tetanus, diphtheria, and acellular pertussis (Tdap, Td) vaccine. You may need a Td booster every 10 years.  Varicella vaccine. You may need this if you have not been vaccinated.  HPV vaccine. If you are 69 or younger, you may need three doses over 6 months.  Measles, mumps, and rubella (MMR) vaccine. You may need at least one dose of MMR. You may also need a second dose.  Pneumococcal 13-valent conjugate (PCV13) vaccine. You may need this if you have certain conditions and were not previously vaccinated.  Pneumococcal polysaccharide (PPSV23) vaccine. You may need one or two doses if you smoke cigarettes or if you have certain conditions.  Meningococcal vaccine. One dose is recommended if you are age 27-21 years and a first-year college student living in a residence hall, or if you have one of several medical conditions. You may also need additional booster doses.  Hepatitis A vaccine. You may need this if you have certain conditions or if you travel or work in places where you may be exposed to hepatitis A.  Hepatitis B vaccine. You may need this if you have certain conditions or if you travel or work in places where you may be exposed to hepatitis B.  Haemophilus influenzae type b (Hib) vaccine. You may need this if  you have certain risk factors.  Talk to your health care provider about which screenings and vaccines you need and how often you need them. This information is not intended to replace advice given to you by your health care provider. Make sure you discuss any questions you have with your health care provider. Document Released: 01/06/2002 Document Revised: 07/30/2016  Document Reviewed: 09/11/2015 Elsevier Interactive Patient Education  [employer].

## 2017-12-03 LAB — HEPATITIS PANEL, ACUTE
HEP A IGM: NONREACTIVE
HEP B S AG: NONREACTIVE
Hep B C IgM: NONREACTIVE
Hepatitis C Ab: NONREACTIVE
SIGNAL TO CUT-OFF: 0.18 (ref <1.00)

## 2017-12-03 LAB — COMPLETE METABOLIC PANEL WITH GFR
AG Ratio: 1.3 (calc) (ref 1.0–2.5)
ALBUMIN MSPROF: 4.2 g/dL (ref 3.6–5.1)
ALKALINE PHOSPHATASE (APISO): 61 U/L (ref 33–115)
ALT: 15 U/L (ref 6–29)
AST: 12 U/L (ref 10–30)
BILIRUBIN TOTAL: 0.4 mg/dL (ref 0.2–1.2)
BUN: 8 mg/dL (ref 7–25)
CHLORIDE: 105 mmol/L (ref 98–110)
CO2: 27 mmol/L (ref 20–32)
CREATININE: 0.82 mg/dL (ref 0.50–1.10)
Calcium: 9.4 mg/dL (ref 8.6–10.2)
GFR, Est African American: 105 mL/min/{1.73_m2} (ref >=60)
GFR, Est Non African American: 91 mL/min/{1.73_m2} (ref >=60)
Globulin: 3.3 g/dL (calc) (ref 1.9–3.7)
Glucose, Bld: 88 mg/dL (ref 65–99)
Potassium: 3.9 mmol/L (ref 3.5–5.3)
Sodium: 138 mmol/L (ref 135–146)
Total Protein: 7.5 g/dL (ref 6.1–8.1)

## 2017-12-03 LAB — CBC WITH DIFFERENTIAL/PLATELET
BASOS PCT: 0.7 %
Basophils Absolute: 31 cells/uL (ref 0–200)
EOS PCT: 3 %
Eosinophils Absolute: 132 cells/uL (ref 15–500)
HEMATOCRIT: 39.8 % (ref 35.0–45.0)
Hemoglobin: 12.7 g/dL (ref 11.7–15.5)
LYMPHS ABS: 1848 {cells}/uL (ref 850–3900)
MCH: 27.3 pg (ref 27.0–33.0)
MCHC: 31.9 g/dL — ABNORMAL LOW (ref 32.0–36.0)
MCV: 85.6 fL (ref 80.0–100.0)
MPV: 8.7 fL (ref 7.5–12.5)
Monocytes Relative: 6.4 %
NEUTROS PCT: 47.9 %
Neutro Abs: 2108 cells/uL (ref 1500–7800)
PLATELETS: 356 10*3/uL (ref 140–400)
RBC: 4.65 10*6/uL (ref 3.80–5.10)
RDW: 12.7 % (ref 11.0–15.0)
Total Lymphocyte: 42 %
WBC: 4.4 10*3/uL (ref 3.8–10.8)
WBCMIX: 282 {cells}/uL (ref 200–950)

## 2017-12-03 LAB — HIV ANTIBODY (ROUTINE TESTING W REFLEX): HIV 1&2 Ab, 4th Generation: NONREACTIVE

## 2017-12-03 LAB — HEMOGLOBIN A1C
EAG (MMOL/L): 6.5 (calc)
Hgb A1c MFr Bld: 5.7 % of total Hgb — ABNORMAL HIGH (ref <5.7)
MEAN PLASMA GLUCOSE: 117 (calc)

## 2017-12-03 LAB — LIPID PANEL
CHOLESTEROL: 172 mg/dL (ref <200)
HDL: 57 mg/dL (ref >50)
LDL CHOLESTEROL (CALC): 102 mg/dL — AB
Non-HDL Cholesterol (Calc): 115 mg/dL (calc) (ref <130)
TRIGLYCERIDES: 45 mg/dL (ref <150)
Total CHOL/HDL Ratio: 3 (calc) (ref <5.0)

## 2017-12-03 LAB — RPR: RPR Ser Ql: NONREACTIVE

## 2017-12-11 ENCOUNTER — Ambulatory Visit (INDEPENDENT_AMBULATORY_CARE_PROVIDER_SITE_OTHER): Payer: Managed Care, Other (non HMO) | Admitting: Podiatry

## 2017-12-11 ENCOUNTER — Encounter: Payer: Self-pay | Admitting: Podiatry

## 2017-12-11 DIAGNOSIS — L720 Epidermal cyst: Secondary | ICD-10-CM | POA: Diagnosis not present

## 2017-12-11 DIAGNOSIS — M659 Synovitis and tenosynovitis, unspecified: Secondary | ICD-10-CM | POA: Diagnosis not present

## 2017-12-11 NOTE — Progress Notes (Signed)
   HPI: 37 year old female presenting today for follow up evaluation of right ankle synovitis secondary to an ankle sprain and a skin lesion consistent with an epidermal inclusion cyst of the left medial foot. She states the ankle pain has improved significantly and she has been wearing the ankle brace as directed. She also reports relief from the injection she received at the last visit. She also reports significant relief of the lesion to the left foot. She does report that she never received the prescription for the Meloxicam. Patient is here for further evaluation and treatment.   Past Medical History:  Diagnosis Date  . Anemia   . History of meningitis   . Pericardial effusion      Physical Exam: General: The patient is alert and oriented x3 in no acute distress.  Dermatology: Skin is warm, dry and supple bilateral lower extremities. Negative for open lesions or macerations.  Vascular: Palpable pedal pulses bilaterally. No edema or erythema noted. Capillary refill within normal limits.  Neurological: Epicritic and protective threshold grossly intact bilaterally.   Musculoskeletal Exam: Range of motion within normal limits to all pedal and ankle joints bilateral. Muscle strength 5/5 in all groups bilateral.      Assessment: - Synovitis of the right ankle secondary to ankle sprain in October 2018 - resolved - Skin lesion consistent with an epidermal inclusion cyst left medial foot - resolved   Plan of Care:  - Patient evaluated. - Continue wearing ankle brace as needed. - Explained that if the lesion to the medial foot returns, we will need to have in office procedure to excise the lesion. - Recommended good shoe gear. - Return to clinic as needed.   Felecia ShellingBrent M. Jamillah Camilo, DPM [facility]  Dr. Felecia ShellingBrent M. Lethia Donlon, DPM    [address]                                        [place], [place] [id number]                Office [phone]  Fax [phone]

## 2018-02-22 ENCOUNTER — Encounter: Payer: BLUE CROSS/BLUE SHIELD | Admitting: Family Medicine

## 2018-12-03 ENCOUNTER — Ambulatory Visit (INDEPENDENT_AMBULATORY_CARE_PROVIDER_SITE_OTHER): Payer: Managed Care, Other (non HMO) | Admitting: Family Medicine

## 2018-12-03 ENCOUNTER — Encounter: Payer: Self-pay | Admitting: Family Medicine

## 2018-12-03 VITALS — BP 100/62 | Temp 98.3°F | Resp 16 | Ht 66.0 in | Wt 164.2 lb

## 2018-12-03 DIAGNOSIS — R5383 Other fatigue: Secondary | ICD-10-CM

## 2018-12-03 DIAGNOSIS — Z862 Personal history of diseases of the blood and blood-forming organs and certain disorders involving the immune mechanism: Secondary | ICD-10-CM

## 2018-12-03 DIAGNOSIS — L309 Dermatitis, unspecified: Secondary | ICD-10-CM | POA: Diagnosis not present

## 2018-12-03 DIAGNOSIS — M25571 Pain in right ankle and joints of right foot: Secondary | ICD-10-CM

## 2018-12-03 DIAGNOSIS — Z13220 Encounter for screening for lipoid disorders: Secondary | ICD-10-CM

## 2018-12-03 DIAGNOSIS — G4709 Other insomnia: Secondary | ICD-10-CM

## 2018-12-03 DIAGNOSIS — Z01419 Encounter for gynecological examination (general) (routine) without abnormal findings: Secondary | ICD-10-CM

## 2018-12-03 DIAGNOSIS — Z1239 Encounter for other screening for malignant neoplasm of breast: Secondary | ICD-10-CM

## 2018-12-03 DIAGNOSIS — R739 Hyperglycemia, unspecified: Secondary | ICD-10-CM

## 2018-12-03 DIAGNOSIS — Z23 Encounter for immunization: Secondary | ICD-10-CM | POA: Diagnosis not present

## 2018-12-03 DIAGNOSIS — G8929 Other chronic pain: Secondary | ICD-10-CM

## 2018-12-03 MED ORDER — TRIAMCINOLONE ACETONIDE 0.1 % EX CREA: 1.0000 "application " | TOPICAL_CREAM | Freq: Two times a day (BID) | CUTANEOUS | 0 refills | Status: DC

## 2018-12-03 NOTE — Progress Notes (Signed)
Name: Katie Hays   MRN: [id number]    DOB: 08-Jul-1981   Date:12/03/2018       Progress Note  Subjective  Chief Complaint  Chief Complaint  Patient presents with  . Annual Exam    HPI   Patient presents for annual CPE and also concerned about fatigue and right ankle pain   Right ankle pain: she used to be a Tourist information centre manager, family history of OA ( mother ), she is concerned because over the past couple of years but getting progressively worse. She states it  bothers her worse when she is driving, she has to remove her shoes and rotate her ankle. No numbness or tingling, she has tried using a brace without much help. She is willing to see podiatrist now  Fatigue: she has been feeling more tired than usual over the past 6 months, she has been travelling more often for their business, she states stress is up but because of the growing business. She is able to fall asleep but has inturrupted sleep and does not feel rested. Discussed starting medication for sleep, she is resistant, advised to try melatonin or unisom  Hyperglycemia: family history of DM, hgbA1C 5.7%, she denies polyphagia, polydipsia or polyuria  Rash: she noticed a rash on ring finger left hand, going on for months, she resumed her ring  Diet: cooks at home, only eating out on weekends. Exercise: discussed increase going to the gym at least 3 times a week   USPSTF grade A and B recommendations    Office Visit from 12/03/2018 in [facility]  AUDIT-C Score  0     Depression:  Depression screen [facility] 2/9 12/03/2018 12/02/2017 02/11/2017 07/04/2016  Decreased Interest 0 0 0 0  Down, Depressed, Hopeless 0 0 0 0  PHQ - 2 Score 0 0 0 0  Altered sleeping 1 - - -  Tired, decreased energy 3 - - -  Change in appetite 1 - - -  Feeling bad or failure about yourself  0 - - -  Trouble concentrating 0 - - -  Moving slowly or fidgety/restless 0 - - -  Suicidal thoughts 0 - - -  PHQ-9 Score 5 - - -  Difficult doing  work/chores Not difficult at all - - -   Hypertension: BP Readings from Last 3 Encounters:  12/03/18 100/62  12/02/17 114/68  11/10/17 90/62   Obesity: Wt Readings from Last 3 Encounters:  12/03/18 164 lb 3.2 oz (74.5 kg)  12/02/17 159 lb 3.2 oz (72.2 kg)  11/10/17 163 lb (73.9 kg)   BMI Readings from Last 3 Encounters:  12/03/18 26.50 kg/m  12/02/17 25.70 kg/m  11/10/17 27.12 kg/m    Hep C Screening: is up to date  STD testing and prevention (HIV/chl/gon/syphilis): N/A Intimate partner violence: no  Sexual History/Pain during Intercourse: none, married  Menstrual History/LMP/Abnormal Bleeding:regular cycles, no significant cramping, LMP 11/18/2018 Incontinence Symptoms: none   Advanced Care Planning: A voluntary discussion about advance care planning including the explanation and discussion of advance directives.  Discussed health care proxy and Living will, and the patient was able to identify a health care proxy as husband .  Patient does have a living will at present time.  Breast cancer: mammogram after birthday  BRCA gene screening: N/A Cervical cancer screening: repeat in 2021  Osteoporosis Screening: discussed high calcium diet   Lipids:  Lab Results  Component Value Date   CHOL 172 12/02/2017   Lab Results  Component  Value Date   HDL 57 12/02/2017   Lab Results  Component Value Date   LDLCALC 102 (H) 12/02/2017   Lab Results  Component Value Date   TRIG 45 12/02/2017   Lab Results  Component Value Date   CHOLHDL 3.0 12/02/2017   No results found for: LDLDIRECT  Glucose:  Glucose  Date Value Ref Range Status  02/17/2013 88 65 - 99 mg/dL Final  05/13/2012 82 65 - 99 mg/dL Final   Glucose, Bld  Date Value Ref Range Status  12/02/2017 88 65 - 99 mg/dL Final    Comment:    .            Fasting reference interval .   07/04/2016 84 65 - 99 mg/dL Final  05/04/2016 83 65 - 99 mg/dL Final    Skin cancer: discussed atypical lesions   Colorectal cancer: N/A until age 34  ECG:2013  Patient Active Problem List   Diagnosis Date Noted  . Family history of heart disease in female family member before age 76 07/04/2016  . MVP (mitral valve prolapse) 10/28/2015  . Migraine headache 10/25/2015  . H/O cesarean section 12/02/2012  . History of blood transfusion 12/02/2012    Past Surgical History:  Procedure Laterality Date  . CESAREAN SECTION  04/30/2016   04/16/2012  . TUBAL LIGATION Bilateral 04/30/2016    Family History  Problem Relation Age of Onset  . Arthritis Mother   . Heart disease Father   . Diabetes Father   . Hypertension Father   . Asthma Sister   . Hypertension Sister   . Diabetes Sister   . Asthma Brother   . Heart attack Brother   . Asthma Sister   . Hypertension Sister   . Heart attack Sister     Social History   Socioeconomic History  . Marital status: Married    Spouse name: Moses  . Number of children: 2  . Years of education: Not on file  . Highest education level: Bachelor's degree (e.g., BA, AB, BS)  Occupational History  . Occupation: real state appraisal  Social Needs  . Financial resource strain: Not hard at all  . Food insecurity:    Worry: Never true    Inability: Never true  . Transportation needs:    Medical: No    Non-medical: No  Tobacco Use  . Smoking status: Never Smoker  . Smokeless tobacco: Never Used  Substance and Sexual Activity  . Alcohol use: Never    Frequency: Never    Comment: never  . Drug use: No  . Sexual activity: Yes    Partners: Male    Birth control/protection: Other-see comments    Comment: Tubal Ligation  Lifestyle  . Physical activity:    Days per week: 2 days    Minutes per session: 60 min  . Stress: Not at all  Relationships  . Social connections:    Talks on phone: More than three times a week    Gets together: More than three times a week    Attends religious service: More than 4 times per year    Active member of club or  organization: Yes    Attends meetings of clubs or organizations: More than 4 times per year    Relationship status: Married  . Intimate partner violence:    Fear of current or ex partner: No    Emotionally abused: No    Physically abused: No    Forced sexual activity: No  Other Topics Concern  . 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  cetirizine (ZYRTEC) 10 MG tablet, Take 10 mg by mouth daily., Disp: , Rfl:  .  Multiple Vitamins-Minerals (MULTIVITAMIN GUMMIES WOMENS PO), Take 2 each by mouth daily. VitaFusion Organic, Disp: , Rfl:  .  omeprazole (PRILOSEC) 20 MG capsule, Take 20 mg by mouth daily. ENT, Disp: , Rfl:   Allergies  Allergen Reactions  . Dilaudid [Hydromorphone Hcl] Swelling     ROS  Constitutional: Negative for fever, positive for mild  weight change. Feeling very tired all the time Respiratory: Negative for cough and shortness of breath.   Cardiovascular: Negative for chest pain or palpitations.  Gastrointestinal: Negative for abdominal pain, no bowel changes.  Musculoskeletal: Negative for gait problem or joint swelling. She has noticed discomfort of right ankle  Skin: Negative for rash.  Neurological: Negative for dizziness or headache.  No other specific complaints in a complete review of systems (except as listed in HPI above).   Objective  Vitals:   12/03/18 0906  BP: 100/62  Resp: 16  Temp: 98.3 F (36.8 C)  TempSrc: Oral  Weight: 164 lb 3.2 oz (74.5 kg)  Height: '5\' 6"'$  (1.676 m)    Body mass index is 26.5 kg/m.  Physical Exam  Constitutional: Patient appears well-developed and well-nourished. No distress.  HENT: Head: Normocephalic and atraumatic. Ears: B TMs ok, no erythema or effusion; Nose: Nose normal. Mouth/Throat: Oropharynx is clear and moist. No oropharyngeal exudate.  Eyes: Conjunctivae and EOM  are normal. Pupils are equal, round, and reactive to light. No scleral icterus.  Neck: Normal range of motion. Neck supple. No JVD present. No thyromegaly present.  Cardiovascular: Normal rate, regular rhythm and normal heart sounds.  No murmur heard. No BLE edema. Pulmonary/Chest: Effort normal and breath sounds normal. No respiratory distress. Abdominal: Soft. Bowel sounds are normal, no distension. There is no tenderness. no masses Breast: no lumps or masses, no nipple discharge or rashes FEMALE GENITALIA:  External genitalia normal External urethra normal Vaginal vault normal without discharge or lesions Cervix normal without discharge or lesions Bimanual exam normal without masses RECTAL: not done  Musculoskeletal: Normal range of motion, no joint effusions. No gross deformities. Normal ankle exam  Neurological: he is alert and oriented to person, place, and time. No cranial nerve deficit. Coordination, balance, strength, speech and gait are normal.  Skin: Skin is warm and dry. Dry rash on left ring finger, no erythema or oozing. No erythema.  Psychiatric: Patient has a normal mood and affect. behavior is normal. Judgment and thought content normal.   PHQ2/9: Depression screen [facility] 2/9 12/03/2018 12/02/2017 02/11/2017 07/04/2016  Decreased Interest 0 0 0 0  Down, Depressed, Hopeless 0 0 0 0  PHQ - 2 Score 0 0 0 0  Altered sleeping 1 - - -  Tired, decreased energy 3 - - -  Change in appetite 1 - - -  Feeling bad or failure about yourself  0 - - -  Trouble concentrating 0 - - -  Moving slowly or fidgety/restless 0 - - -  Suicidal thoughts 0 - - -  PHQ-9 Score 5 - - -  Difficult doing work/chores Not difficult at all - - -     Fall Risk: Fall Risk  12/03/2018 12/02/2017 11/10/2017 07/04/2016  Falls in the past year? 0 No No No  Number falls in past yr: 0 - - -  Injury with Fall? 0 - - -     Functional Status Survey: Is the patient deaf or have difficulty hearing?: No Does the  patient have difficulty seeing, even when wearing glasses/contacts?: No Does the patient have difficulty concentrating, remembering, or making decisions?: No Does the patient have difficulty walking or climbing stairs?: No Does the patient have difficulty dressing or bathing?: No Does the patient have difficulty doing errands alone such as visiting a doctor's office or shopping?: No   Assessment & Plan  1. Well woman exam  - B12 and Folate Panel - COMPLETE METABOLIC PANEL WITH GFR - CBC with Differential/Platelet - Lipid panel - TSH - Hemoglobin A1c - VITAMIN D 25 Hydroxy (Vit-D Deficiency, Fractures)  2. Needs flu shot  - Flu Vaccine QUAD 36+ mos IM  3. Hand dermatitis  - triamcinolone cream (KENALOG) 0.1 %; Apply 1 application topically 2 (two) times daily.  Dispense: 30 g; Refill: 0  4. Other fatigue  - B12 and Folate Panel - CBC with Differential/Platelet - TSH - VITAMIN D 25 Hydroxy (Vit-D Deficiency, Fractures)  5. Lipid screening  -lipid panel   6. History of anemia  - B12 and Folate Panel - CBC with Differential/Platelet  7. Chronic pain of right ankle  - Ambulatory referral to Podiatry  8. Hyperglycemia  - Hemoglobin A1c  9. Breast cancer screening  - MM Digital Screening; Future   10. Other insomnia  It may be the cause of fatigue, she will try otc supplements for now   -USPSTF grade A and B recommendations reviewed with patient; age-appropriate recommendations, preventive care, screening tests, etc discussed and encouraged; healthy living encouraged; see AVS for patient education given to patient -Discussed importance of 150 minutes of physical activity weekly, eat two servings of fish weekly, eat one serving of tree nuts ( cashews, pistachios, pecans, almonds.Marland Kitchen) every other day, eat 6 servings of fruit/vegetables daily and drink plenty of water and avoid sweet beverages.

## 2018-12-03 NOTE — Patient Instructions (Signed)
Melatonin supplement or  Unisom  Preventive Care 18-39 Years, Female Preventive care refers to lifestyle choices and visits with your health care provider that can promote health and wellness. What does preventive care include?   A yearly physical exam. This is also called an annual well check.  Dental exams once or twice a year.  Routine eye exams. Ask your health care provider how often you should have your eyes checked.  Personal lifestyle choices, including: ? Daily care of your teeth and gums. ? Regular physical activity. ? Eating a healthy diet. ? Avoiding tobacco and drug use. ? Limiting alcohol use. ? Practicing safe sex. ? Taking vitamin and mineral supplements as recommended by your health care provider. What happens during an annual well check? The services and screenings done by your health care provider during your annual well check will depend on your age, overall health, lifestyle risk factors, and family history of disease. Counseling Your health care provider may ask you questions about your:  Alcohol use.  Tobacco use.  Drug use.  Emotional well-being.  Home and relationship well-being.  Sexual activity.  Eating habits.  Work and work Statistician.  Method of birth control.  Menstrual cycle.  Pregnancy history. Screening You may have the following tests or measurements:  Height, weight, and BMI.  Diabetes screening. This is done by checking your blood sugar (glucose) after you have not eaten for a while (fasting).  Blood pressure.  Lipid and cholesterol levels. These may be checked every 5 years starting at age 3.  Skin check.  Hepatitis C blood test.  Hepatitis B blood test.  Sexually transmitted disease (STD) testing.  BRCA-related cancer screening. This may be done if you have a family history of breast, ovarian, tubal, or peritoneal cancers.  Pelvic exam and Pap test. This may be done every 3 years starting at age 64. Starting  at age 81, this may be done every 5 years if you have a Pap test in combination with an HPV test. Discuss your test results, treatment options, and if necessary, the need for more tests with your health care provider. Vaccines Your health care provider may recommend certain vaccines, such as:  Influenza vaccine. This is recommended every year.  Tetanus, diphtheria, and acellular pertussis (Tdap, Td) vaccine. You may need a Td booster every 10 years.  Varicella vaccine. You may need this if you have not been vaccinated.  HPV vaccine. If you are 30 or younger, you may need three doses over 6 months.  Measles, mumps, and rubella (MMR) vaccine. You may need at least one dose of MMR. You may also need a second dose.  Pneumococcal 13-valent conjugate (PCV13) vaccine. You may need this if you have certain conditions and were not previously vaccinated.  Pneumococcal polysaccharide (PPSV23) vaccine. You may need one or two doses if you smoke cigarettes or if you have certain conditions.  Meningococcal vaccine. One dose is recommended if you are age 76-21 years and a first-year college student living in a residence hall, or if you have one of several medical conditions. You may also need additional booster doses.  Hepatitis A vaccine. You may need this if you have certain conditions or if you travel or work in places where you may be exposed to hepatitis A.  Hepatitis B vaccine. You may need this if you have certain conditions or if you travel or work in places where you may be exposed to hepatitis B.  Haemophilus influenzae type b (Hib) vaccine.  You may need this if you have certain risk factors. Talk to your health care provider about which screenings and vaccines you need and how often you need them. This information is not intended to replace advice given to you by your health care provider. Make sure you discuss any questions you have with your health care provider. Document Released:  01/06/2002 Document Revised: 06/23/2017 Document Reviewed: 09/11/2015 Elsevier Interactive Patient Education  2019 [employer].

## 2018-12-05 ENCOUNTER — Other Ambulatory Visit: Payer: Self-pay | Admitting: Family Medicine

## 2018-12-05 MED ORDER — VITAMIN D (ERGOCALCIFEROL) 1.25 MG (50000 UNIT) PO CAPS: 50000.0000 [IU] | ORAL_CAPSULE | ORAL | 0 refills | Status: DC

## 2018-12-07 LAB — CBC WITH DIFFERENTIAL/PLATELET
Absolute Monocytes: 288 cells/uL (ref 200–950)
BASOS ABS: 30 {cells}/uL (ref 0–200)
Basophils Relative: 1 %
EOS PCT: 7.9 %
Eosinophils Absolute: 237 cells/uL (ref 15–500)
HCT: 35.1 % (ref 35.0–45.0)
HEMOGLOBIN: 11 g/dL — AB (ref 11.7–15.5)
Lymphs Abs: 1629 cells/uL (ref 850–3900)
MCH: 26.3 pg — ABNORMAL LOW (ref 27.0–33.0)
MCHC: 31.3 g/dL — AB (ref 32.0–36.0)
MCV: 83.8 fL (ref 80.0–100.0)
MONOS PCT: 9.6 %
MPV: 8.9 fL (ref 7.5–12.5)
NEUTROS ABS: 816 {cells}/uL — AB (ref 1500–7800)
NEUTROS PCT: 27.2 %
Platelets: 350 10*3/uL (ref 140–400)
RBC: 4.19 10*6/uL (ref 3.80–5.10)
RDW: 13.4 % (ref 11.0–15.0)
Total Lymphocyte: 54.3 %
WBC: 3 10*3/uL — AB (ref 3.8–10.8)

## 2018-12-07 LAB — TEST AUTHORIZATION

## 2018-12-07 LAB — LIPID PANEL
CHOLESTEROL: 162 mg/dL (ref <200)
HDL: 59 mg/dL (ref >50)
LDL Cholesterol (Calc): 90 mg/dL (calc)
Non-HDL Cholesterol (Calc): 103 mg/dL (calc) (ref <130)
Total CHOL/HDL Ratio: 2.7 (calc) (ref <5.0)
Triglycerides: 43 mg/dL (ref <150)

## 2018-12-07 LAB — COMPLETE METABOLIC PANEL WITH GFR
AG Ratio: 1.3 (calc) (ref 1.0–2.5)
ALBUMIN MSPROF: 4.2 g/dL (ref 3.6–5.1)
ALKALINE PHOSPHATASE (APISO): 52 U/L (ref 33–115)
ALT: 13 U/L (ref 6–29)
AST: 14 U/L (ref 10–30)
BILIRUBIN TOTAL: 0.4 mg/dL (ref 0.2–1.2)
BUN: 12 mg/dL (ref 7–25)
CHLORIDE: 108 mmol/L (ref 98–110)
CO2: 27 mmol/L (ref 20–32)
Calcium: 9.4 mg/dL (ref 8.6–10.2)
Creat: 0.87 mg/dL (ref 0.50–1.10)
GFR, Est African American: 97 mL/min/{1.73_m2} (ref >=60)
GFR, Est Non African American: 84 mL/min/{1.73_m2} (ref >=60)
GLOBULIN: 3.2 g/dL (ref 1.9–3.7)
Glucose, Bld: 89 mg/dL (ref 65–99)
Potassium: 4.9 mmol/L (ref 3.5–5.3)
Sodium: 139 mmol/L (ref 135–146)
Total Protein: 7.4 g/dL (ref 6.1–8.1)

## 2018-12-07 LAB — TSH: TSH: 1.08 mIU/L

## 2018-12-07 LAB — HEMOGLOBIN A1C
HEMOGLOBIN A1C: 5.4 %{Hb} (ref <5.7)
MEAN PLASMA GLUCOSE: 108 (calc)
eAG (mmol/L): 6 (calc)

## 2018-12-07 LAB — IRON,TIBC AND FERRITIN PANEL
%SAT: 19 % (calc) (ref 16–45)
Ferritin: 6 ng/mL — ABNORMAL LOW (ref 16–154)
Iron: 77 ug/dL (ref 40–190)
TIBC: 395 mcg/dL (calc) (ref 250–450)

## 2018-12-07 LAB — B12 AND FOLATE PANEL
Folate: 10.9 ng/mL
VITAMIN B 12: 352 pg/mL (ref 200–1100)

## 2018-12-07 LAB — VITAMIN D 25 HYDROXY (VIT D DEFICIENCY, FRACTURES): Vit D, 25-Hydroxy: 13 ng/mL — ABNORMAL LOW (ref 30–100)

## 2018-12-17 ENCOUNTER — Ambulatory Visit: Payer: Self-pay | Admitting: *Deleted

## 2018-12-17 NOTE — Telephone Encounter (Signed)
Message from Metro KungShiquita C Johnson sent at 12/17/2018 2:28 PM EST   Pt called in to get clarity on her medication directions. Advised pt of what they are reading per the Rx directions but pt says that she received a phone call advising her of something different.   Please confirm with pt

## 2018-12-20 NOTE — Telephone Encounter (Signed)
Patient called. Clarification was given on her OTC medications per lab results.

## 2018-12-21 ENCOUNTER — Ambulatory Visit (INDEPENDENT_AMBULATORY_CARE_PROVIDER_SITE_OTHER): Payer: Managed Care, Other (non HMO)

## 2018-12-21 ENCOUNTER — Encounter: Payer: Self-pay | Admitting: Podiatry

## 2018-12-21 ENCOUNTER — Ambulatory Visit: Payer: Managed Care, Other (non HMO) | Admitting: Podiatry

## 2018-12-21 DIAGNOSIS — G5751 Tarsal tunnel syndrome, right lower limb: Secondary | ICD-10-CM | POA: Diagnosis not present

## 2018-12-21 DIAGNOSIS — M62838 Other muscle spasm: Secondary | ICD-10-CM | POA: Diagnosis not present

## 2018-12-21 DIAGNOSIS — M659 Synovitis and tenosynovitis, unspecified: Secondary | ICD-10-CM

## 2018-12-21 MED ORDER — MELOXICAM 15 MG PO TABS: 15.0000 mg | ORAL_TABLET | Freq: Every day | ORAL | 1 refills | Status: DC

## 2018-12-21 MED ORDER — CYCLOBENZAPRINE HCL 5 MG PO TABS: 5.0000 mg | ORAL_TABLET | Freq: Three times a day (TID) | ORAL | 1 refills | Status: DC | PRN

## 2018-12-21 NOTE — Progress Notes (Signed)
   HPI: 38 year old healthy active female presents the office today for evaluation of right foot and ankle pain.  Patient notices the pain most significantly while driving.  She says that her foot twitches and flexes while driving.  Is aggravated by shoe gear while driving.  Patient is basically nonsymptomatic other than driving.  This is been ongoing for the past year.  Patient denies trauma.  She has been wearing her ankle brace that has not helped and also tried different shoes that do not help.  Past Medical History:  Diagnosis Date  . Anemia   . History of meningitis   . Pericardial effusion      Physical Exam: General: The patient is alert and oriented x3 in no acute distress.  Dermatology: Skin is warm, dry and supple bilateral lower extremities. Negative for open lesions or macerations.  Vascular: Palpable pedal pulses bilaterally. No edema or erythema noted. Capillary refill within normal limits.  Neurological: Epicritic and protective threshold grossly intact bilaterally.  Positive Tinel sign with percussion of the tibial nerve right lower extremity consistent with possible tarsal tunnel syndrome  Musculoskeletal Exam: Range of motion within normal limits to all pedal and ankle joints bilateral. Muscle strength 5/5 in all groups bilateral.  During clinical exam the patient began to have episodes of muscle spasming with the foot inverting in relationship to the leg.  Radiographic Exam:  Normal osseous mineralization. Joint spaces preserved. No fracture/dislocation/boney destruction.  There may be some very subtle signs of degenerative changes within the ankle joint.  Assessment: 1.  Tarsal tunnel syndrome right 2.  Intermittent muscle spasms right   Plan of Care:  1. Patient evaluated. X-Rays reviewed.  2.  Injection of 0.5 cc Celestone Soluspan injected along the tarsal tunnel right lower extremity 3.  Recommend over-the-counter arch supports to support the tarsal tunnel 4.   Prescription for meloxicam 15 mg daily 5.  Prescription for Flexeril 5 mg 6.  Return to clinic in 4 weeks, if the patient is not improved we will likely need to order nerve conduction studies.  Patient symptoms may also be caused by impingement of the more proximal nerve while in the sitting position driving      Felecia Shelling, DPM [facility]  Dr. Felecia Shelling, DPM    [address] [place], [place] [address]                Office [phone]  Fax [phone]

## 2018-12-24 ENCOUNTER — Telehealth: Payer: Self-pay

## 2018-12-24 NOTE — Telephone Encounter (Signed)
Copied from CRM [id number]. Topic: General - Other >> Dec 23, 2018  1:41 PM Jaquita Rector A wrote: Reason for CRM: Patient called to say that she picked up Rx from the pharmacy for Vitamin D, Ergocalciferol, (DRISDOL) 1.25 MG (50000 UT) CAPS capsule and have misplaced the bottle. Asking for a new Rx to be sent to the pharmacy.   Ph# [phone]

## 2018-12-24 NOTE — Telephone Encounter (Signed)
Insurance will not cover it, she will need to wait until mid feb, she can take otc 2000units daily until next fill it due

## 2018-12-24 NOTE — Telephone Encounter (Signed)
Patient was informed and verbalized understanding.

## 2019-01-03 ENCOUNTER — Encounter: Payer: Self-pay | Admitting: Family Medicine

## 2019-01-03 DIAGNOSIS — E559 Vitamin D deficiency, unspecified: Secondary | ICD-10-CM | POA: Insufficient documentation

## 2019-01-18 ENCOUNTER — Ambulatory Visit: Payer: BLUE CROSS/BLUE SHIELD | Admitting: Podiatry

## 2019-01-18 ENCOUNTER — Encounter: Payer: Self-pay | Admitting: Podiatry

## 2019-01-18 DIAGNOSIS — G5751 Tarsal tunnel syndrome, right lower limb: Secondary | ICD-10-CM | POA: Diagnosis not present

## 2019-01-18 DIAGNOSIS — M62838 Other muscle spasm: Secondary | ICD-10-CM | POA: Diagnosis not present

## 2019-01-18 MED ORDER — MELOXICAM 15 MG PO TABS: 15.0000 mg | ORAL_TABLET | Freq: Every day | ORAL | 1 refills | Status: DC

## 2019-01-18 MED ORDER — CYCLOBENZAPRINE HCL 5 MG PO TABS: 5.0000 mg | ORAL_TABLET | Freq: Three times a day (TID) | ORAL | 1 refills | Status: DC | PRN

## 2019-01-19 NOTE — Progress Notes (Signed)
   HPI: 38 year old female presenting today for follow up evaluation of right foot pain. She states her pain has improved. She reports some mild discomfort but it is not like it was previously. She has been taking the Meloxicam and Flexeril for treatment. She reports significant relief after receiving the injection at the last visit. There are no modifying factors noted. Patient is here for further evaluation and treatment.   Past Medical History:  Diagnosis Date  . Anemia   . History of meningitis   . Pericardial effusion      Physical Exam: General: The patient is alert and oriented x3 in no acute distress.  Dermatology: Skin is warm, dry and supple bilateral lower extremities. Negative for open lesions or macerations.  Vascular: Palpable pedal pulses bilaterally. No edema or erythema noted. Capillary refill within normal limits.  Neurological: Epicritic and protective threshold grossly intact bilaterally.  Positive Tinel sign with percussion of the tibial nerve right lower extremity consistent with possible tarsal tunnel syndrome  Musculoskeletal Exam: Range of motion within normal limits to all pedal and ankle joints bilateral. Muscle strength 5/5 in all groups bilateral.  During clinical exam the patient began to have episodes of muscle spasming with the foot inverting in relationship to the leg.  Assessment: 1.  Tarsal tunnel syndrome right 2.  Intermittent muscle spasms right   Plan of Care:  1. Patient evaluated. 2. Patient did not take the full 3-4 weeks of medication.  3. Injection seemed to improve the symptoms.  4. Refill prescription for Meloxicam #30 provided to patient.  5. Refill prescription for Flexeril #30 provided to patient.  6. Return to clinic in 4 weeks.      Felecia Shelling, DPM [facility]  Dr. Felecia Shelling, DPM    [address] [place], [place] [address]                Office [phone]    Fax [phone]

## 2019-02-08 ENCOUNTER — Ambulatory Visit: Payer: Managed Care, Other (non HMO) | Admitting: Family Medicine

## 2019-02-08 ENCOUNTER — Encounter: Payer: Self-pay | Admitting: Family Medicine

## 2019-02-08 ENCOUNTER — Other Ambulatory Visit: Payer: Self-pay

## 2019-02-08 ENCOUNTER — Telehealth: Payer: Self-pay

## 2019-02-08 VITALS — BP 104/62 | HR 99 | Temp 98.1°F | Resp 18 | Ht 66.0 in | Wt 163.1 lb

## 2019-02-08 DIAGNOSIS — D708 Other neutropenia: Secondary | ICD-10-CM

## 2019-02-08 DIAGNOSIS — D500 Iron deficiency anemia secondary to blood loss (chronic): Secondary | ICD-10-CM

## 2019-02-08 DIAGNOSIS — M5442 Lumbago with sciatica, left side: Secondary | ICD-10-CM | POA: Diagnosis not present

## 2019-02-08 DIAGNOSIS — K5903 Drug induced constipation: Secondary | ICD-10-CM

## 2019-02-08 MED ORDER — FERROUS SULFATE 325 (65 FE) MG PO TABS: 325.0000 mg | ORAL_TABLET | Freq: Every day | ORAL | 0 refills | Status: DC

## 2019-02-08 MED ORDER — PREDNISONE 10 MG (48) PO TBPK: ORAL_TABLET | ORAL | 0 refills | Status: DC

## 2019-02-08 MED ORDER — TRAMADOL HCL 50 MG PO TABS: 50.0000 mg | ORAL_TABLET | Freq: Three times a day (TID) | ORAL | 0 refills | Status: DC | PRN

## 2019-02-08 MED ORDER — POLYETHYLENE GLYCOL 3350 17 GM/SCOOP PO POWD: 17.0000 g | Freq: Two times a day (BID) | ORAL | 1 refills | Status: DC | PRN

## 2019-02-08 NOTE — Telephone Encounter (Signed)
Patient was notified.

## 2019-02-08 NOTE — Telephone Encounter (Signed)
Copied from CRM [id number]. Topic: General - Other >> Feb 08, 2019  2:48 PM Percival Spanish wrote:  predniSONE (STERAPRED UNI-PAK 48 TAB) 10 MG (48) TBPK tablet     pt has questions about how to take this med she said the directions are different from what she was told

## 2019-02-08 NOTE — Progress Notes (Signed)
Name: Katie Hays   MRN: [id number]    DOB: May 06, 1981   Date:02/08/2019       Progress Note  Subjective  Chief Complaint  Chief Complaint  Patient presents with  . Back Pain    sporadic the past 2 weeks worst with movement. Left lower back and when walking radiates down her left thigh. Has tried heating pad and Tylenol with no relief symptoms were exacerbate since last night. has even tired massage therapy.     HPI  Acute low back pain with radiculitis: she states symptoms started a couple of weeks ago, initially it was sporadic but worse at night, however since last night pain has been constant  , throbbing like and shooting down left lateral leg. No bowel or bladder incontinence. Se has tried Tylenol, heating pad without help. She denies dysuria, urinary frequency, nausea or vomiting.   Constipation: she states she has episodes of constipation, she states severe after birth of her second child. The pain on her back is similar so she took miralax to see if it would improve symptoms but it did not help at all.   Anemia Iron deficiency: she states cycles have heavier over the past 6 months, heavy and lasting one week, she has been taking iron supplements and MVI we will recheck level today No pica at this time  Patient Active Problem List   Diagnosis Date Noted  . Vitamin D deficiency 01/03/2019  . Family history of heart disease in female family member before age 40 07/04/2016  . MVP (mitral valve prolapse) 10/28/2015  . Migraine headache 10/25/2015  . H/O cesarean section 12/02/2012  . History of blood transfusion 12/02/2012    Past Surgical History:  Procedure Laterality Date  . CESAREAN SECTION  04/30/2016   04/16/2012  . TUBAL LIGATION Bilateral 04/30/2016    Family History  Problem Relation Age of Onset  . Arthritis Mother   . Heart disease Father   . Diabetes Father   . Hypertension Father   . Asthma Sister   . Hypertension Sister   . Diabetes Sister   . Asthma  Brother   . Heart attack Brother   . Asthma Sister   . Hypertension Sister   . Heart attack Sister     Social History   Socioeconomic History  . Marital status: Married    Spouse name: Moses  . Number of children: 2  . Years of education: Not on file  . Highest education level: Bachelor's degree (e.g., BA, AB, BS)  Occupational History  . Occupation: real state appraisal  Social Needs  . Financial resource strain: Not hard at all  . Food insecurity:    Worry: Never true    Inability: Never true  . Transportation needs:    Medical: No    Non-medical: No  Tobacco Use  . Smoking status: Never Smoker  . Smokeless tobacco: Never Used  Substance and Sexual Activity  . Alcohol use: Never    Frequency: Never    Comment: never  . Drug use: No  . Sexual activity: Yes    Partners: Male    Birth control/protection: Other-see comments    Comment: Tubal Ligation  Lifestyle  . Physical activity:    Days per week: 2 days    Minutes per session: 60 min  . Stress: Not at all  Relationships  . Social connections:    Talks on phone: More than three times a week    Gets together: More than  three times a week    Attends religious service: More than 4 times per year    Active member of club or organization: Yes    Attends meetings of clubs or organizations: More than 4 times per year    Relationship status: Married  . Intimate partner violence:    Fear of current or ex partner: No    Emotionally abused: No    Physically abused: No    Forced sexual activity: No  Other Topics Concern  . 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  cetirizine (ZYRTEC) 10 MG tablet, Take 10 mg by mouth daily., Disp: , Rfl:  .  Multiple Vitamins-Minerals (MULTIVITAMIN GUMMIES WOMENS PO), Take 2 each by mouth daily. VitaFusion Organic, Disp: , Rfl:  .  triamcinolone  cream (KENALOG) 0.1 %, Apply 1 application topically 2 (two) times daily., Disp: 30 g, Rfl: 0 .  Vitamin D, Ergocalciferol, (DRISDOL) 1.25 MG (50000 UT) CAPS capsule, Take 1 capsule (50,000 Units total) by mouth every 7 (seven) days., Disp: 12 capsule, Rfl: 0 .  cyclobenzaprine (FLEXERIL) 5 MG tablet, Take 1 tablet (5 mg total) by mouth 3 (three) times daily as needed for muscle spasms. (Patient not taking: Reported on 02/08/2019), Disp: 30 tablet, Rfl: 1 .  omeprazole (PRILOSEC) 20 MG capsule, Take 20 mg by mouth daily. ENT, Disp: , Rfl:   Allergies  Allergen Reactions  . Dilaudid [Hydromorphone Hcl] Swelling    I personally reviewed active problem list, medication list, allergies, family history, social history with the patient/caregiver today.   ROS  Ten systems reviewed and is negative except as mentioned in HPI   Objective  Vitals:   02/08/19 1019  BP: 104/62  Pulse: 99  Resp: 18  Temp: 98.1 F (36.7 C)  TempSrc: Oral  SpO2: 99%  Weight: 163 lb 1.6 oz (74 kg)  Height:  (1.676 m)    Body mass index is 26.33 kg/m.  Physical Exam  Constitutional: Patient appears well-developed and well-nourished. Overweight.  No distress.  HEENT: head atraumatic, normocephalic, pupils equal and reactive to light, boggy and pale turbinates,  neck supple, throat within normal limits Cardiovascular: Normal rate, regular rhythm and normal heart sounds.  No murmur heard. No BLE edema. Pulmonary/Chest: Effort normal and breath sounds normal. No respiratory distress. Abdominal: Soft.  There is no tenderness. Muscular Skeletal: very tender on left lower and mid back, she denies urinary symptoms, pain also on left lateral hip, negative straight leg raise on exam Psychiatric: Patient has a normal mood and affect. behavior is normal. Judgment and thought content normal.  Recent Results (from the past 2160 hour(s))  B12 and Folate Panel     Status: None   Collection Time: 12/03/18  9:55 AM   Result Value Ref Range   Vitamin B-12 352 200 - 1,100 pg/mL    Comment: . Please Note: Although the reference range for vitamin B12 is [phone] pg/mL, it has been reported that between 5 and 10% of patients with values between 200 and 400 pg/mL may experience neuropsychiatric and hematologic abnormalities due to occult B12 deficiency; less than 1% of patients with values above 400 pg/mL will have symptoms. .    Folate 10.9 ng/mL    Comment:  Reference Range                            Low:           <3.4                            Borderline:    3.4-5.4                            Normal:        >5.4 .   COMPLETE METABOLIC PANEL WITH GFR     Status: None   Collection Time: 12/03/18  9:55 AM  Result Value Ref Range   Glucose, Bld 89 65 - 99 mg/dL    Comment: .            Fasting reference interval .    BUN 12 7 - 25 mg/dL   Creat 6.83 4.19 - 6.22 mg/dL   GFR, Est Non African American 84 > OR = 60 mL/min/1.41m2   GFR, Est African American 97 > OR = 60 mL/min/1.29m2   BUN/Creatinine Ratio NOT APPLICABLE 6 - 22 (calc)   Sodium 139 135 - 146 mmol/L   Potassium 4.9 3.5 - 5.3 mmol/L   Chloride 108 98 - 110 mmol/L   CO2 27 20 - 32 mmol/L   Calcium 9.4 8.6 - 10.2 mg/dL   Total Protein 7.4 6.1 - 8.1 g/dL   Albumin 4.2 3.6 - 5.1 g/dL   Globulin 3.2 1.9 - 3.7 g/dL (calc)   AG Ratio 1.3 1.0 - 2.5 (calc)   Total Bilirubin 0.4 0.2 - 1.2 mg/dL   Alkaline phosphatase (APISO) 52 33 - 115 U/L   AST 14 10 - 30 U/L   ALT 13 6 - 29 U/L  CBC with Differential/Platelet     Status: Abnormal   Collection Time: 12/03/18  9:55 AM  Result Value Ref Range   WBC 3.0 (L) 3.8 - 10.8 Thousand/uL   RBC 4.19 3.80 - 5.10 Million/uL   Hemoglobin 11.0 (L) 11.7 - 15.5 g/dL   HCT 29.7 98.9 - 21.1 %   MCV 83.8 80.0 - 100.0 fL   MCH 26.3 (L) 27.0 - 33.0 pg   MCHC 31.3 (L) 32.0 - 36.0 g/dL   RDW 94.1 74.0 - 81.4 %   Platelets 350 140 - 400 Thousand/uL   MPV 8.9 7.5 - 12.5 fL    Neutro Abs 816 (L) 1,500 - 7,800 cells/uL   Lymphs Abs 1,629 850 - 3,900 cells/uL   Absolute Monocytes 288 200 - 950 cells/uL   Eosinophils Absolute 237 15 - 500 cells/uL   Basophils Absolute 30 0 - 200 cells/uL   Neutrophils Relative % 27.2 %   Total Lymphocyte 54.3 %   Monocytes Relative 9.6 %   Eosinophils Relative 7.9 %   Basophils Relative 1.0 %  Lipid panel     Status: None   Collection Time: 12/03/18  9:55 AM  Result Value Ref Range   Cholesterol 162 <200 mg/dL   HDL 59 >48 mg/dL   Triglycerides 43 <185 mg/dL   LDL Cholesterol (Calc) 90 mg/dL (calc)    Comment: Reference range: <100 . Desirable range <100 mg/dL for primary prevention;   <70 mg/dL for patients with CHD or diabetic patients  with > or = 2 CHD risk factors. Marland Kitchen LDL-C is now calculated  using the Martin-Hopkins  calculation, which is a validated novel method providing  better accuracy than the Friedewald equation in the  estimation of LDL-C.  Horald Pollen et al. Lenox Ahr. 0109;323(55): 2061-2068  (http://education.[facility].com/faq/FAQ164)    Total CHOL/HDL Ratio 2.7 <5.0 (calc)   Non-HDL Cholesterol (Calc) 103 <130 mg/dL (calc)    Comment: For patients with diabetes plus 1 major ASCVD risk  factor, treating to a non-HDL-C goal of <100 mg/dL  (LDL-C of <73 mg/dL) is considered a therapeutic  option.   TSH     Status: None   Collection Time: 12/03/18  9:55 AM  Result Value Ref Range   TSH 1.08 mIU/L    Comment:           Reference Range .           > or = 20 Years  0.40-4.50 .                Pregnancy Ranges           First trimester    0.26-2.66           Second trimester   0.55-2.73           Third trimester    0.43-2.91   Hemoglobin A1c     Status: None   Collection Time: 12/03/18  9:55 AM  Result Value Ref Range   Hgb A1c MFr Bld 5.4 <5.7 % of total Hgb    Comment: For the purpose of screening for the presence of diabetes: . <5.7%       Consistent with the absence of diabetes 5.7-6.4%     Consistent with increased risk for diabetes             (prediabetes) > or =6.5%  Consistent with diabetes . This assay result is consistent with a decreased risk of diabetes. . Currently, no consensus exists regarding use of hemoglobin A1c for diagnosis of diabetes in children. . According to American Diabetes Association (ADA) guidelines, hemoglobin A1c <7.0% represents optimal control in non-pregnant diabetic patients. Different metrics may apply to specific patient populations.  Standards of Medical Care in Diabetes(ADA). .    Mean Plasma Glucose 108 (calc)   eAG (mmol/L) 6.0 (calc)  VITAMIN D 25 Hydroxy (Vit-D Deficiency, Fractures)     Status: Abnormal   Collection Time: 12/03/18  9:55 AM  Result Value Ref Range   Vit D, 25-Hydroxy 13 (L) 30 - 100 ng/mL    Comment: Vitamin D Status         25-OH Vitamin D: . Deficiency:                    <20 ng/mL Insufficiency:             20 - 29 ng/mL Optimal:                 > or = 30 ng/mL . For 25-OH Vitamin D testing on patients on  D2-supplementation and patients for whom quantitation  of D2 and D3 fractions is required, the [facility](TM) 25-OH VIT D, (D2,D3), LC/MS/MS is recommended: order  code 22025 (patients >53yrs). . For more information on this test, go to: http://education.[facility].com/faq/FAQ163 (This link is being provided for  informational/educational purposes only.)   Iron, TIBC and Ferritin Panel     Status: Abnormal   Collection Time: 12/03/18  9:55 AM  Result Value Ref Range   Iron 77 40 - 190 mcg/dL   TIBC 427 062 -  450 mcg/dL (calc)   %SAT 19 16 - 45 % (calc)   Ferritin 6 (L) 16 - 154 ng/mL  TEST AUTHORIZATION     Status: None   Collection Time: 12/03/18  9:55 AM  Result Value Ref Range   TEST NAME: IRON, TIBC AND FERRITIN PANEL    TEST CODE: [id number]    CLIENT CONTACT: TISH MCMURTY    REPORT ALWAYS MESSAGE SIGNATURE      Comment: . The laboratory testing on this patient was verbally  requested or confirmed by the ordering physician or his or her authorized representative after contact with an employee of [employer]. Federal regulations require that we maintain on file written authorization for all laboratory testing.  Accordingly we are asking that the ordering physician or his or her authorized representative sign a copy of this report and promptly return it to the client service representative. . . Signature:____________________________________________________ . Please fax this signed page to [phone] or return it via your [employer] courier.     PHQ2/9: Depression screen [facility] 2/9 02/08/2019 12/03/2018 12/02/2017 02/11/2017 07/04/2016  Decreased Interest 0 0 0 0 0  Down, Depressed, Hopeless 0 0 0 0 0  PHQ - 2 Score 0 0 0 0 0  Altered sleeping 2 1 - - -  Tired, decreased energy 1 3 - - -  Change in appetite 0 1 - - -  Feeling bad or failure about yourself  0 0 - - -  Trouble concentrating 0 0 - - -  Moving slowly or fidgety/restless 0 0 - - -  Suicidal thoughts 0 0 - - -  PHQ-9 Score 3 5 - - -  Difficult doing work/chores Not difficult at all Not difficult at all - - -     Fall Risk: Fall Risk  02/08/2019 12/03/2018 12/02/2017 11/10/2017 07/04/2016  Falls in the past year? 0 0 No No No  Number falls in past yr: 0 0 - - -  Injury with Fall? 0 0 - - -    Functional Status Survey: Is the patient deaf or have difficulty hearing?: No Does the patient have difficulty seeing, even when wearing glasses/contacts?: No Does the patient have difficulty concentrating, remembering, or making decisions?: No Does the patient have difficulty walking or climbing stairs?: No Does the patient have difficulty dressing or bathing?: No Does the patient have difficulty doing errands alone such as visiting a doctor's office or shopping?: No    Assessment & Plan  1. Acute back pain with sciatica, left  - predniSONE (STERAPRED UNI-PAK 48 TAB) 10 MG (48) TBPK  tablet; Take as directed with food  Dispense: 48 tablet; Refill: 0 - traMADol (ULTRAM) 50 MG tablet; Take 1 tablet (50 mg total) by mouth every 8 (eight) hours as needed.  Dispense: 20 tablet; Refill: 0  2. Iron deficiency anemia due to chronic blood loss  - ferrous sulfate 325 (65 FE) MG tablet; Take 1 tablet (325 mg total) by mouth daily with breakfast.  Dispense: 90 tablet; Refill: 0 - CBC with Differential/Platelet - Iron, TIBC and Ferritin Panel  3. Other neutropenia (HCC)  Monitor for now    4. Drug-induced constipation  Started back after she started to take iron pills  - polyethylene glycol powder (GLYCOLAX/MIRALAX) powder; Take 17 g by mouth 2 (two) times daily as needed.  Dispense: 3350 g; Refill: 1

## 2019-02-09 LAB — CBC WITH DIFFERENTIAL/PLATELET
Absolute Monocytes: 220 cells/uL (ref 200–950)
Basophils Absolute: 50 cells/uL (ref 0–200)
Basophils Relative: 1.6 %
Eosinophils Absolute: 152 cells/uL (ref 15–500)
Eosinophils Relative: 4.9 %
HCT: 38.3 % (ref 35.0–45.0)
Hemoglobin: 12.2 g/dL (ref 11.7–15.5)
Lymphs Abs: 1761 cells/uL (ref 850–3900)
MCH: 27.2 pg (ref 27.0–33.0)
MCHC: 31.9 g/dL — ABNORMAL LOW (ref 32.0–36.0)
MCV: 85.3 fL (ref 80.0–100.0)
MPV: 8.9 fL (ref 7.5–12.5)
Monocytes Relative: 7.1 %
Neutro Abs: 918 cells/uL — ABNORMAL LOW (ref 1500–7800)
Neutrophils Relative %: 29.6 %
Platelets: 356 10*3/uL (ref 140–400)
RBC: 4.49 10*6/uL (ref 3.80–5.10)
RDW: 13.6 % (ref 11.0–15.0)
Total Lymphocyte: 56.8 %
WBC: 3.1 10*3/uL — AB (ref 3.8–10.8)

## 2019-02-09 LAB — IRON,TIBC AND FERRITIN PANEL
%SAT: 19 % (calc) (ref 16–45)
Ferritin: 5 ng/mL — ABNORMAL LOW (ref 16–154)
Iron: 63 ug/dL (ref 40–190)
TIBC: 329 ug/dL (ref 250–450)

## 2019-02-14 ENCOUNTER — Ambulatory Visit: Payer: Self-pay

## 2019-02-14 NOTE — Telephone Encounter (Signed)
Pt called and said that she finished an RX of prednisone on Friday and on Saturday started to feel bad.  She says she is real fatigued and has mild chest pain with exertion. She reports swelling in her hands and face which has resolved.  She was being treated for back pain. Her back still bothers her but she is more concerned with her fatigue. She rates the chest pain as mild,only with exertion, and lasting just a few seconds. Appointment scheduled per protocol.  Care advice read to patient.  Pt verbalized understanding of all instructions.  Reason for Disposition . [1] Chest pain lasting <= 5 minutes AND [2] NO chest pain or cardiac symptoms now(Exceptions: pains lasting a few seconds)  Answer Assessment - Initial Assessment Questions 1. DESCRIPTION: "Describe how you are feeling."     Weak back achy 2. SEVERITY: "How bad is it?"  "Can you stand and walk?"   - MILD - Feels weak or tired, but does not interfere with work, school or normal activities   - MODERATE - Able to stand and walk; weakness interferes with work, school, or normal activities   - SEVERE - Unable to stand or walk     Just to bathroom 3. ONSET:  "When did the weakness begin?"     Saturday 4. CAUSE: "What do you think is causing the weakness?"     Coming off prednisone 5. MEDICINES: "Have you recently started a new medicine or had a change in the amount of a medicine?"     prednisone 6. OTHER SYMPTOMS: "Do you have any other symptoms?" (e.g., chest pain, fever, cough, SOB, vomiting, diarrhea, bleeding, other areas of pain)     SOB Chest pain  7. PREGNANCY: "Is there any chance you are pregnant?" "When was your last menstrual period?"     No this week  Answer Assessment - Initial Assessment Questions 1. LOCATION: "Where does it hurt?"       center 2. RADIATION: "Does the pain go anywhere else?" (e.g., into neck, jaw, arms, back)     no 3. ONSET: "When did the chest pain begin?" (Minutes, hours or days)      When  prednisone stopped 4. PATTERN "Does the pain come and go, or has it been constant since it started?"  "Does it get worse with exertion?"      Comes and goes gets worse with movement with fatugue 5. DURATION: "How long does it last" (e.g., seconds, minutes, hours)     seconds 6. SEVERITY: "How bad is the pain?"  (e.g., Scale 1-10; mild, moderate, or severe)    - MILD (1-3): doesn't interfere with normal activities     - MODERATE (4-7): interferes with normal activities or awakens from sleep    - SEVERE (8-10): excruciating pain, unable to do any normal activities       1-3 7. CARDIAC RISK FACTORS: "Do you have any history of heart problems or risk factors for heart disease?" (e.g., prior heart attack, angina; high blood pressure, diabetes, being overweight, high cholesterol, smoking, or strong family history of heart disease)     no 8. PULMONARY RISK FACTORS: "Do you have any history of lung disease?"  (e.g., blood clots in lung, asthma, emphysema, birth control pills)     no 9. CAUSE: "What do you think is causing the chest pain?"    Caused by coming off of prednisone 10. OTHER SYMPTOMS: "Do you have any other symptoms?" (e.g., dizziness, nausea, vomiting, sweating, fever, difficulty  breathing, cough)       No SOB 11. PREGNANCY: "Is there any chance you are pregnant?" "When was your last menstrual period?"       No Menses this week  Protocols used: CHEST PAIN-A-AH, WEAKNESS (GENERALIZED) AND FATIGUE-A-AH

## 2019-02-15 ENCOUNTER — Encounter: Payer: Self-pay | Admitting: Family Medicine

## 2019-02-15 ENCOUNTER — Emergency Department: Payer: BLUE CROSS/BLUE SHIELD

## 2019-02-15 ENCOUNTER — Other Ambulatory Visit: Payer: Self-pay

## 2019-02-15 ENCOUNTER — Emergency Department
Admission: EM | Admit: 2019-02-15 | Discharge: 2019-02-15 | Disposition: A | Discharge status: Home / Self Care | Payer: BLUE CROSS/BLUE SHIELD | Attending: Emergency Medicine | Admitting: Emergency Medicine

## 2019-02-15 ENCOUNTER — Encounter: Payer: Self-pay | Admitting: Emergency Medicine

## 2019-02-15 ENCOUNTER — Ambulatory Visit: Payer: BLUE CROSS/BLUE SHIELD | Admitting: Podiatry

## 2019-02-15 ENCOUNTER — Ambulatory Visit: Payer: BLUE CROSS/BLUE SHIELD | Admitting: Family Medicine

## 2019-02-15 VITALS — BP 114/64 | HR 89 | Temp 99.0°F | Resp 16 | Ht 66.0 in | Wt 164.1 lb

## 2019-02-15 DIAGNOSIS — J3089 Other allergic rhinitis: Secondary | ICD-10-CM

## 2019-02-15 DIAGNOSIS — M5442 Lumbago with sciatica, left side: Secondary | ICD-10-CM | POA: Diagnosis not present

## 2019-02-15 DIAGNOSIS — G44219 Episodic tension-type headache, not intractable: Secondary | ICD-10-CM

## 2019-02-15 DIAGNOSIS — Z79899 Other long term (current) drug therapy: Secondary | ICD-10-CM | POA: Insufficient documentation

## 2019-02-15 DIAGNOSIS — R0789 Other chest pain: Secondary | ICD-10-CM | POA: Diagnosis not present

## 2019-02-15 DIAGNOSIS — R079 Chest pain, unspecified: Secondary | ICD-10-CM | POA: Insufficient documentation

## 2019-02-15 DIAGNOSIS — R5383 Other fatigue: Secondary | ICD-10-CM | POA: Diagnosis not present

## 2019-02-15 DIAGNOSIS — J302 Other seasonal allergic rhinitis: Secondary | ICD-10-CM

## 2019-02-15 LAB — COMPREHENSIVE METABOLIC PANEL
ALT: 21 U/L (ref 0–44)
ANION GAP: 6 (ref 5–15)
AST: 17 U/L (ref 15–41)
Albumin: 3.8 g/dL (ref 3.5–5.0)
Alkaline Phosphatase: 45 U/L (ref 38–126)
BUN: 12 mg/dL (ref 6–20)
CHLORIDE: 107 mmol/L (ref 98–111)
CO2: 26 mmol/L (ref 22–32)
Calcium: 9.2 mg/dL (ref 8.9–10.3)
Creatinine, Ser: 0.75 mg/dL (ref 0.44–1.00)
GFR calc Af Amer: >60 mL/min (ref >60)
GFR calc non Af Amer: >60 mL/min (ref >60)
Glucose, Bld: 67 mg/dL — ABNORMAL LOW (ref 70–99)
POTASSIUM: 3.4 mmol/L — AB (ref 3.5–5.1)
Sodium: 139 mmol/L (ref 135–145)
Total Bilirubin: 0.5 mg/dL (ref 0.3–1.2)
Total Protein: 7 g/dL (ref 6.5–8.1)

## 2019-02-15 LAB — CBC
HCT: 37.7 % (ref 36.0–46.0)
Hemoglobin: 12.6 g/dL (ref 12.0–15.0)
MCH: 28.4 pg (ref 26.0–34.0)
MCHC: 33.4 g/dL (ref 30.0–36.0)
MCV: 84.9 fL (ref 80.0–100.0)
Platelets: 354 10*3/uL (ref 150–400)
RBC: 4.44 MIL/uL (ref 3.87–5.11)
RDW: 14.6 % (ref 11.5–15.5)
WBC: 4.5 10*3/uL (ref 4.0–10.5)
nRBC: 0 % (ref 0.0–0.2)

## 2019-02-15 LAB — TROPONIN I: Troponin I: <0.03 ng/mL (ref <0.03)

## 2019-02-15 MED ORDER — ACETAMINOPHEN-CODEINE #3 300-30 MG PO TABS: 1.0000 | ORAL_TABLET | ORAL | 0 refills | Status: DC | PRN

## 2019-02-15 MED ORDER — ONDANSETRON HCL 4 MG/2ML IJ SOLN
4.0000 mg | Freq: Once | INTRAMUSCULAR | Status: AC
  Administered 2019-02-15: 4 mg via INTRAVENOUS
  Filled 2019-02-15: qty 2

## 2019-02-15 MED ORDER — SODIUM CHLORIDE 0.9 % IV BOLUS
1000.0000 mL | Freq: Once | INTRAVENOUS | Status: AC
  Administered 2019-02-15: 1000 mL via INTRAVENOUS

## 2019-02-15 MED ORDER — MONTELUKAST SODIUM 10 MG PO TABS: 10.0000 mg | ORAL_TABLET | Freq: Every day | ORAL | 3 refills | Status: DC

## 2019-02-15 MED ORDER — IOHEXOL 350 MG/ML SOLN
75.0000 mL | Freq: Once | INTRAVENOUS | Status: AC | PRN
  Administered 2019-02-15: 75 mL via INTRAVENOUS

## 2019-02-15 MED ORDER — FENTANYL CITRATE (PF) 100 MCG/2ML IJ SOLN
50.0000 ug | Freq: Once | INTRAMUSCULAR | Status: AC
  Administered 2019-02-15: 50 ug via INTRAVENOUS
  Filled 2019-02-15: qty 2

## 2019-02-15 MED ORDER — LORAZEPAM 1 MG PO TABS
1.0000 mg | ORAL_TABLET | Freq: Once | ORAL | Status: AC
  Administered 2019-02-15: 1 mg via ORAL
  Filled 2019-02-15: qty 1

## 2019-02-15 MED ORDER — MELOXICAM 7.5 MG PO TABS: 7.5000 mg | ORAL_TABLET | Freq: Two times a day (BID) | ORAL | 2 refills | Status: DC | PRN

## 2019-02-15 NOTE — Patient Instructions (Signed)

## 2019-02-15 NOTE — ED Triage Notes (Signed)
Pt presents to ED via POV with c/o chest pain for 2 weeks. Pt states she was seen by PCP recently for the same. While in triage pt went unresponsive and would not awake to painful stimuli. Pt wheeled back into ED room, doctor Paduchowski at bedside. Pt now alert and c/o 10/10 chest pain. Pt unable to speak in complete sentences and c/o shortness of breath.

## 2019-02-15 NOTE — ED Notes (Signed)
Patient transported to CT 

## 2019-02-15 NOTE — ED Notes (Signed)
Pt assisted to bedside commode. Pt with steady gait. Pt respirations still slightly labored at this time. Respiration rate of 28. Pt denies chest pain at this time. Pt assisted back to bed. This RN will continue to monitor.

## 2019-02-15 NOTE — Progress Notes (Signed)
Name: Katie Hays   MRN: [id number]    DOB: Dec 30, 1980   Date:02/15/2019       Progress Note  Subjective  Chief Complaint  Chief Complaint  Patient presents with  . Back Pain    Finished Prednisone on Friday and Saturday started having symptoms of frontal headaches, mild chest pain, fatigue and back pain came back completely on Sunday    HPI  Acute low back pain with radiculitis: she states symptoms started a 3 weeks ago, initially it was sporadic but worse at night, however about 10 days ago it became constant and severe, described as , throbbing like and shooting down left lateral leg. No bowel or bladder incontinence. Se had tried Tylenol, heating pad without help. She was seen in our office on 03/17 and was given a steroid taper. Symptoms resolved with prednisone however finished medication on Friday and one day later developed severe fatigue, irritability, difficulty breathing, myalgia , headache. She states back pain also back for the past 3 days however no radiculitis now.   AR: year round, nasal congestion, headache, rhinorrhea, clearing her throat. Taking zyrtec  Episodic tension-type headache: she has a history of meningitis, she has episodes of severe headaches that sometimes responds to tylenol, we will try tylenol number 3 that worked in the past   Patient Active Problem List   Diagnosis Date Noted  . Vitamin D deficiency 01/03/2019  . Family history of heart disease in female family member before age 57 07/04/2016  . MVP (mitral valve prolapse) 10/28/2015  . Migraine headache 10/25/2015  . H/O cesarean section 12/02/2012  . History of blood transfusion 12/02/2012    Past Surgical History:  Procedure Laterality Date  . CESAREAN SECTION  04/30/2016   04/16/2012  . TUBAL LIGATION Bilateral 04/30/2016    Family History  Problem Relation Age of Onset  . Arthritis Mother   . Heart disease Father   . Diabetes Father   . Hypertension Father   . Asthma Sister   .  Hypertension Sister   . Diabetes Sister   . Asthma Brother   . Heart attack Brother   . Asthma Sister   . Hypertension Sister   . Heart attack Sister     Social History   Socioeconomic History  . Marital status: Married    Spouse name: Moses  . Number of children: 2  . Years of education: Not on file  . Highest education level: Bachelor's degree (e.g., BA, AB, BS)  Occupational History  . Occupation: real state appraisal  Social Needs  . Financial resource strain: Not hard at all  . Food insecurity:    Worry: Never true    Inability: Never true  . Transportation needs:    Medical: No    Non-medical: No  Tobacco Use  . Smoking status: Never Smoker  . Smokeless tobacco: Never Used  Substance and Sexual Activity  . Alcohol use: Never    Frequency: Never    Comment: never  . Drug use: No  . Sexual activity: Yes    Partners: Male    Birth control/protection: Other-see comments    Comment: Tubal Ligation  Lifestyle  . Physical activity:    Days per week: 2 days    Minutes per session: 60 min  . Stress: Not at all  Relationships  . Social connections:    Talks on phone: More than three times a week    Gets together: More than three times a week  Attends religious service: More than 4 times per year    Active member of club or organization: Yes    Attends meetings of clubs or organizations: More than 4 times per year    Relationship status: Married  . Intimate partner violence:    Fear of current or ex partner: No    Emotionally abused: No    Physically abused: No    Forced sexual activity: No  Other Topics Concern  . 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  cetirizine (ZYRTEC) 10 MG tablet, Take 10 mg by mouth daily., Disp: , Rfl:  .  ferrous sulfate 325 (65 FE) MG tablet, Take 1 tablet (325 mg total) by mouth daily with  breakfast., Disp: 90 tablet, Rfl: 0 .  Multiple Vitamins-Minerals (MULTIVITAMIN GUMMIES WOMENS PO), Take 2 each by mouth daily. VitaFusion Organic, Disp: , Rfl:  .  polyethylene glycol powder (GLYCOLAX/MIRALAX) powder, Take 17 g by mouth 2 (two) times daily as needed., Disp: 3350 g, Rfl: 1 .  traMADol (ULTRAM) 50 MG tablet, Take 1 tablet (50 mg total) by mouth every 8 (eight) hours as needed., Disp: 20 tablet, Rfl: 0 .  triamcinolone cream (KENALOG) 0.1 %, Apply 1 application topically 2 (two) times daily., Disp: 30 g, Rfl: 0 .  Vitamin D, Ergocalciferol, (DRISDOL) 1.25 MG (50000 UT) CAPS capsule, Take 1 capsule (50,000 Units total) by mouth every 7 (seven) days., Disp: 12 capsule, Rfl: 0 .  cyclobenzaprine (FLEXERIL) 5 MG tablet, Take 1 tablet (5 mg total) by mouth 3 (three) times daily as needed for muscle spasms. (Patient not taking: Reported on 02/15/2019), Disp: 30 tablet, Rfl: 1 .  predniSONE (STERAPRED UNI-PAK 48 TAB) 10 MG (48) TBPK tablet, Take as directed with food (Patient not taking: Reported on 02/15/2019), Disp: 48 tablet, Rfl: 0  Allergies  Allergen Reactions  . Dilaudid [Hydromorphone Hcl] Swelling    I personally reviewed active problem list, medication list, allergies, family history, social history with the patient/caregiver today.   ROS  Constitutional: Negative for fever or weight change.  Respiratory: Negative for cough and shortness of breath.   Cardiovascular: Negative for chest pain or palpitations.  Gastrointestinal: Negative for abdominal pain, no bowel changes.  Musculoskeletal: Negative for gait problem or joint swelling.  Skin: Negative for rash.  Neurological: Negative for dizziness, positive for headache.  No other specific complaints in a complete review of systems (except as listed in HPI above).  Objective  Vitals:   02/15/19 0944  BP: 114/64  Pulse: 89  Resp: 16  Temp: 99 F (37.2 C)  TempSrc: Oral  SpO2: 99%  Weight: 164 lb 1.6 oz (74.4 kg)   Height: 5\' 6"  (1.676 m)    Body mass index is 26.49 kg/m.  Physical Exam  Constitutional: Patient appears well-developed and well-nourished. Overweight.  No distress.  HEENT: head atraumatic, normocephalic, pupils equal and reactive to light, neck supple, throat within normal limits, boggy turbinates Cardiovascular: Normal rate, regular rhythm and normal heart sounds.  No murmur heard. No BLE edema. Pulmonary/Chest: Effort normal and breath sounds normal. No respiratory distress. Abdominal: Soft.  There is no tenderness. Neurological: no focal findings.  Muscular skeletal: negative straight leg raise, not pain during palpation of lumbar spine  Psychiatric: Patient has a normal mood and affect. behavior is normal. Judgment and  thought content normal.  Recent Results (from the past 2160 hour(s))  B12 and Folate Panel     Status: None   Collection Time: 12/03/18  9:55 AM  Result Value Ref Range   Vitamin B-12 352 200 - 1,100 pg/mL    Comment: . Please Note: Although the reference range for vitamin B12 is [id number] pg/mL, it has been reported that between 5 and 10% of patients with values between 200 and 400 pg/mL may experience neuropsychiatric and hematologic abnormalities due to occult B12 deficiency; less than 1% of patients with values above 400 pg/mL will have symptoms. .    Folate 10.9 ng/mL    Comment:                            Reference Range                            Low:           <3.4                            Borderline:    3.4-5.4                            Normal:        >5.4 .   COMPLETE METABOLIC PANEL WITH GFR     Status: None   Collection Time: 12/03/18  9:55 AM  Result Value Ref Range   Glucose, Bld 89 65 - 99 mg/dL    Comment: .            Fasting reference interval .    BUN 12 7 - 25 mg/dL   Creat 1.61 0.96 - 0.45 mg/dL   GFR, Est Non African American 84 > OR = 60 mL/min/1.67m2   GFR, Est African American 97 > OR = 60 mL/min/1.61m2    BUN/Creatinine Ratio NOT APPLICABLE 6 - 22 (calc)   Sodium 139 135 - 146 mmol/L   Potassium 4.9 3.5 - 5.3 mmol/L   Chloride 108 98 - 110 mmol/L   CO2 27 20 - 32 mmol/L   Calcium 9.4 8.6 - 10.2 mg/dL   Total Protein 7.4 6.1 - 8.1 g/dL   Albumin 4.2 3.6 - 5.1 g/dL   Globulin 3.2 1.9 - 3.7 g/dL (calc)   AG Ratio 1.3 1.0 - 2.5 (calc)   Total Bilirubin 0.4 0.2 - 1.2 mg/dL   Alkaline phosphatase (APISO) 52 33 - 115 U/L   AST 14 10 - 30 U/L   ALT 13 6 - 29 U/L  CBC with Differential/Platelet     Status: Abnormal   Collection Time: 12/03/18  9:55 AM  Result Value Ref Range   WBC 3.0 (L) 3.8 - 10.8 Thousand/uL   RBC 4.19 3.80 - 5.10 Million/uL   Hemoglobin 11.0 (L) 11.7 - 15.5 g/dL   HCT 40.9 81.1 - 91.4 %   MCV 83.8 80.0 - 100.0 fL   MCH 26.3 (L) 27.0 - 33.0 pg   MCHC 31.3 (L) 32.0 - 36.0 g/dL   RDW 78.2 95.6 - 21.3 %   Platelets 350 140 - 400 Thousand/uL   MPV 8.9 7.5 - 12.5 fL   Neutro Abs 816 (L) 1,500 - 7,800 cells/uL   Lymphs Abs 1,629 850 - 3,900 cells/uL   Absolute Monocytes 288  200 - 950 cells/uL   Eosinophils Absolute 237 15 - 500 cells/uL   Basophils Absolute 30 0 - 200 cells/uL   Neutrophils Relative % 27.2 %   Total Lymphocyte 54.3 %   Monocytes Relative 9.6 %   Eosinophils Relative 7.9 %   Basophils Relative 1.0 %  Lipid panel     Status: None   Collection Time: 12/03/18  9:55 AM  Result Value Ref Range   Cholesterol 162 <200 mg/dL   HDL 59 >40 mg/dL   Triglycerides 43 <981 mg/dL   LDL Cholesterol (Calc) 90 mg/dL (calc)    Comment: Reference range: <100 . Desirable range <100 mg/dL for primary prevention;   <70 mg/dL for patients with CHD or diabetic patients  with > or = 2 CHD risk factors. Marland Kitchen LDL-C is now calculated using the Martin-Hopkins  calculation, which is a validated novel method providing  better accuracy than the Friedewald equation in the  estimation of LDL-C.  Horald Pollen et al. Lenox Ahr. 1914;782(95): 2061-2068   (http://education.[facility].com/faq/FAQ164)    Total CHOL/HDL Ratio 2.7 <5.0 (calc)   Non-HDL Cholesterol (Calc) 103 <130 mg/dL (calc)    Comment: For patients with diabetes plus 1 major ASCVD risk  factor, treating to a non-HDL-C goal of <100 mg/dL  (LDL-C of <62 mg/dL) is considered a therapeutic  option.   TSH     Status: None   Collection Time: 12/03/18  9:55 AM  Result Value Ref Range   TSH 1.08 mIU/L    Comment:           Reference Range .           > or = 20 Years  0.40-4.50 .                Pregnancy Ranges           First trimester    0.26-2.66           Second trimester   0.55-2.73           Third trimester    0.43-2.91   Hemoglobin A1c     Status: None   Collection Time: 12/03/18  9:55 AM  Result Value Ref Range   Hgb A1c MFr Bld 5.4 <5.7 % of total Hgb    Comment: For the purpose of screening for the presence of diabetes: . <5.7%       Consistent with the absence of diabetes 5.7-6.4%    Consistent with increased risk for diabetes             (prediabetes) > or =6.5%  Consistent with diabetes . This assay result is consistent with a decreased risk of diabetes. . Currently, no consensus exists regarding use of hemoglobin A1c for diagnosis of diabetes in children. . According to American Diabetes Association (ADA) guidelines, hemoglobin A1c <7.0% represents optimal control in non-pregnant diabetic patients. Different metrics may apply to specific patient populations.  Standards of Medical Care in Diabetes(ADA). .    Mean Plasma Glucose 108 (calc)   eAG (mmol/L) 6.0 (calc)  VITAMIN D 25 Hydroxy (Vit-D Deficiency, Fractures)     Status: Abnormal   Collection Time: 12/03/18  9:55 AM  Result Value Ref Range   Vit D, 25-Hydroxy 13 (L) 30 - 100 ng/mL    Comment: Vitamin D Status         25-OH Vitamin D: . Deficiency:                    <  20 ng/mL Insufficiency:             20 - 29 ng/mL Optimal:                 > or = 30 ng/mL . For 25-OH Vitamin  D testing on patients on  D2-supplementation and patients for whom quantitation  of D2 and D3 fractions is required, the QuestAssureD(TM) 25-OH VIT D, (D2,D3), LC/MS/MS is recommended: order  code 41287 (patients >32yrs). . For more information on this test, go to: http://education.[facility].com/faq/FAQ163 (This link is being provided for  informational/educational purposes only.)   Iron, TIBC and Ferritin Panel     Status: Abnormal   Collection Time: 12/03/18  9:55 AM  Result Value Ref Range   Iron 77 40 - 190 mcg/dL   TIBC 867 672 - 094 mcg/dL (calc)   %SAT 19 16 - 45 % (calc)   Ferritin 6 (L) 16 - 154 ng/mL  TEST AUTHORIZATION     Status: None   Collection Time: 12/03/18  9:55 AM  Result Value Ref Range   TEST NAME: IRON, TIBC AND FERRITIN PANEL    TEST CODE: [id number]    CLIENT CONTACT: TISH MCMURTY    REPORT ALWAYS MESSAGE SIGNATURE      Comment: . The laboratory testing on this patient was verbally requested or confirmed by the ordering physician or his or her authorized representative after contact with an employee of [employer]. Federal regulations require that we maintain on file written authorization for all laboratory testing.  Accordingly we are asking that the ordering physician or his or her authorized representative sign a copy of this report and promptly return it to the client service representative. . . Signature:____________________________________________________ . Please fax this signed page to [phone] or return it via your [employer] courier.   CBC with Differential/Platelet     Status: Abnormal   Collection Time: 02/08/19 11:09 AM  Result Value Ref Range   WBC 3.1 (L) 3.8 - 10.8 Thousand/uL   RBC 4.49 3.80 - 5.10 Million/uL   Hemoglobin 12.2 11.7 - 15.5 g/dL   HCT 94.7 65.4 - 65.0 %   MCV 85.3 80.0 - 100.0 fL   MCH 27.2 27.0 - 33.0 pg   MCHC 31.9 (L) 32.0 - 36.0 g/dL   RDW 35.4 65.6 - 81.2 %   Platelets 356 140 - 400  Thousand/uL   MPV 8.9 7.5 - 12.5 fL   Neutro Abs 918 (L) 1,500 - 7,800 cells/uL   Lymphs Abs 1,761 850 - 3,900 cells/uL   Absolute Monocytes 220 200 - 950 cells/uL   Eosinophils Absolute 152 15 - 500 cells/uL   Basophils Absolute 50 0 - 200 cells/uL   Neutrophils Relative % 29.6 %   Total Lymphocyte 56.8 %   Monocytes Relative 7.1 %   Eosinophils Relative 4.9 %   Basophils Relative 1.6 %  Iron, TIBC and Ferritin Panel     Status: Abnormal   Collection Time: 02/08/19 11:09 AM  Result Value Ref Range   Iron 63 40 - 190 mcg/dL   TIBC 751 700 - 174 mcg/dL (calc)   %SAT 19 16 - 45 % (calc)   Ferritin 5 (L) 16 - 154 ng/mL      PHQ2/9: Depression screen [facility] 2/9 02/08/2019 12/03/2018 12/02/2017 02/11/2017 07/04/2016  Decreased Interest 0 0 0 0 0  Down, Depressed, Hopeless 0 0 0 0 0  PHQ - 2 Score 0 0 0 0 0  Altered sleeping 2  1 - - -  Tired, decreased energy 1 3 - - -  Change in appetite 0 1 - - -  Feeling bad or failure about yourself  0 0 - - -  Trouble concentrating 0 0 - - -  Moving slowly or fidgety/restless 0 0 - - -  Suicidal thoughts 0 0 - - -  PHQ-9 Score 3 5 - - -  Difficult doing work/chores Not difficult at all Not difficult at all - - -    Fall Risk: Fall Risk  02/08/2019 12/03/2018 12/02/2017 11/10/2017 07/04/2016  Falls in the past year? 0 0 No No No  Number falls in past yr: 0 0 - - -  Injury with Fall? 0 0 - - -    Assessment & Plan  1. Other fatigue  Explained it may secondary to coming off prednisone or secondary to allergies   2. Acute back pain with sciatica, left  Advised PT, however COVID-19 has all outpatient PT facilities closed and we will give her some home exercise   3. Perennial allergic rhinitis with seasonal variation  - montelukast (SINGULAIR) 10 MG tablet; Take 1 tablet (10 mg total) by mouth at bedtime.  Dispense: 30 tablet; Refill: 3  Take loratadine in am and zyrtec at night.    4. Episodic tension-type headache, not intractable  -  acetaminophen-codeine (TYLENOL #3) 300-30 MG tablet; Take 1 tablet by mouth every 4 (four) hours as needed for moderate pain.  Dispense: 15 tablet; Refill: 0

## 2019-02-15 NOTE — ED Provider Notes (Signed)
[facility] Emergency Department Provider Note  Time seen: 6:24 PM  I have reviewed the triage vital signs and the nursing notes.   HISTORY  Chief Complaint Chest pain   HPI Katie Hays is a 38 y.o. female with a past medical history of anemia, presents to the emergency department for chest pain.  According to the patient for the past 4 days she has had progressively worsening chest pain which became acutely worse today.  Describes as being in the left lower chest.  During triage while the patient was talking to the nurse she acutely became unresponsive, however when she was brought emergently into the room she is awake and alert, able to give me history but appears quite anxious.  Is not clear if she had a true syncopal episode at this time.  Describes her chest pain as moderate located in the left lower chest.  No worse with deep inspiration.  Denies any cough congestion or fever.  No leg pain or swelling.  No abdominal pain.  Largely negative review of systems otherwise.   Past Medical History:  Diagnosis Date  . Anemia   . History of meningitis   . Pericardial effusion     Patient Active Problem List   Diagnosis Date Noted  . Vitamin D deficiency 01/03/2019  . Family history of heart disease in female family member before age 8 07/04/2016  . MVP (mitral valve prolapse) 10/28/2015  . Migraine headache 10/25/2015  . H/O cesarean section 12/02/2012  . History of blood transfusion 12/02/2012    Past Surgical History:  Procedure Laterality Date  . CESAREAN SECTION  04/30/2016   04/16/2012  . TUBAL LIGATION Bilateral 04/30/2016    Prior to Admission medications   Medication Sig Start Date End Date Taking? Authorizing Provider  acetaminophen-codeine (TYLENOL #3) 300-30 MG tablet Take 1 tablet by mouth every 4 (four) hours as needed for moderate pain. 02/15/19   Alba Cory, MD  cetirizine (ZYRTEC) 10 MG tablet Take 10 mg by mouth daily.     [provider]  cyclobenzaprine (FLEXERIL) 5 MG tablet Take 1 tablet (5 mg total) by mouth 3 (three) times daily as needed for muscle spasms. Patient not taking: Reported on 02/15/2019 01/18/19   Felecia Shelling, DPM  ferrous sulfate 325 (65 FE) MG tablet Take 1 tablet (325 mg total) by mouth daily with breakfast. 02/08/19   Alba Cory, MD  montelukast (SINGULAIR) 10 MG tablet Take 1 tablet (10 mg total) by mouth at bedtime. 02/15/19   Alba Cory, MD  Multiple Vitamins-Minerals (MULTIVITAMIN GUMMIES WOMENS PO) Take 2 each by mouth daily. VitaFusion Organic    [provider]  polyethylene glycol powder (GLYCOLAX/MIRALAX) powder Take 17 g by mouth 2 (two) times daily as needed. 02/08/19   Alba Cory, MD  traMADol (ULTRAM) 50 MG tablet Take 1 tablet (50 mg total) by mouth every 8 (eight) hours as needed. 02/08/19   Alba Cory, MD  triamcinolone cream (KENALOG) 0.1 % Apply 1 application topically 2 (two) times daily. 12/03/18   Alba Cory, MD  Vitamin D, Ergocalciferol, (DRISDOL) 1.25 MG (50000 UT) CAPS capsule Take 1 capsule (50,000 Units total) by mouth every 7 (seven) days. 12/05/18   Alba Cory, MD    Allergies  Allergen Reactions  . Dilaudid [Hydromorphone Hcl] Swelling    Family History  Problem Relation Age of Onset  . Arthritis Mother   . Heart disease Father   . Diabetes Father   . Hypertension  Father   . Asthma Sister   . Hypertension Sister   . Diabetes Sister   . Asthma Brother   . Heart attack Brother   . Asthma Sister   . Hypertension Sister   . Heart attack Sister     Social History Social History   Tobacco Use  . Smoking status: Never Smoker  . Smokeless tobacco: Never Used  Substance Use Topics  . Alcohol use: Never    Frequency: Never    Comment: never  . Drug use: No    Review of Systems Constitutional: Negative for fever. ENT: Negative for recent illness/congestion Cardiovascular: Left lower chest  pain Respiratory: Negative for shortness of breath.  Negative for pleuritic pain. Gastrointestinal: Negative for abdominal pain, vomiting  Genitourinary: Negative for urinary compaints Musculoskeletal: Negative for leg pain or swelling. Neurological: Negative for headache All other ROS negative  ____________________________________________   PHYSICAL EXAM:  Constitutional: Alert and oriented.  Patient is quite anxious appearing, hyperventilating. Eyes: Normal exam ENT   Head: Normocephalic and atraumatic.   Mouth/Throat: Mucous membranes are moist. Cardiovascular: Normal rate, regular rhythm. No murmur Respiratory: Mild tachypnea, hyperventilating.  Clear lung sounds bilaterally. Gastrointestinal: Soft and nontender. No distention.  Musculoskeletal: Nontender with normal range of motion in all extremities. No lower extremity tenderness or edema. Neurologic:  Normal speech and language. No gross focal neurologic deficits  Skin:  Skin is warm, dry and intact.  Psychiatric: Anxious  ____________________________________________    EKG  EKG viewed and interpreted by myself shows a normal sinus rhythm at 73 bpm with a narrow QRS, normal axis, normal intervals, nonspecific ST changes.  Electrical interference due to hyperventilation.  ____________________________________________    RADIOLOGY  IMPRESSION: 1. No acute pulmonary consolidation. 2. No acute pulmonary embolus. 3. Stable cardiomegaly with interval increase in pericardial effusion measuring up to 2.3 cm in thickness posteriorly, previously 1.3 cm on limited views of the included heart from prior CT abdomen and pelvis dated 04/03/2016. 4. Dilatation of the pulmonary arterial trunk suggesting chronic pulmonary hypertension.  ____________________________________________   INITIAL IMPRESSION / ASSESSMENT AND PLAN / ED COURSE  Pertinent labs & imaging results that were available during my care of the patient  were reviewed by me and considered in my medical decision making (see chart for details).  Patient presents emergency department for 4 days of left lower chest pain, became acutely worse today.  While she was talking to the triage nurse she acutely appeared to be in pain when unresponsive per triage nurse was brought emergently back to room 2 for evaluation.  I saw the patient upon arrival, she is very anxious hyperventilating but able to give me a good history.  Differential at this time is quite broad but would include ACS, PE, less likely dissection, chest wall pain, anxiety.  We will check labs, EKG, CT scan of the chest and continue to closely monitor.  We will treat with pain medication and IV fluids.  Reviewed the patient's recent records, she was prescribed prednisone and Ultram on 02/08/2019 for lower back pain.  Patient did have a telephone evaluation performed the other day in which she did mention chest pain as well.  Patient work-up is largely reassuring.  Troponin is negative.  Patient states she feels much better at this time.  Chest pain is resolved.  CT angiography of the chest shows no PE or consolidation but it does show 2.3 cm pericardial effusion.  Appears to be increasing from 2017 however a gradual increase in  unlikely to be the cause of the patient's symptoms given the patient's normal heart rate and blood pressure.  I did discuss the patient with Dr. Gwen Pounds who recommends having the patient follow-up in the office for a repeat echocardiogram.  I discussed this with the patient she is agreeable to plan of care.  I also discussed a very short course of pain medication in case the patient has any mild discomfort however I discussed if the pain does return as significant as it was or if she become short of breath she needs to come to the emergency department immediately.  Patient agreeable to plan of care.  ____________________________________________   FINAL CLINICAL IMPRESSION(S)  / ED DIAGNOSES  Left-sided chest pain   Minna Antis, MD 02/15/19 2040

## 2019-02-18 ENCOUNTER — Inpatient Hospital Stay
Admission: EM | Admit: 2019-02-18 | Discharge: 2019-02-20 | DRG: 194 | Disposition: A | Discharge status: Home / Self Care | Payer: BLUE CROSS/BLUE SHIELD | Attending: Internal Medicine | Admitting: Internal Medicine

## 2019-02-18 ENCOUNTER — Other Ambulatory Visit: Payer: Self-pay

## 2019-02-18 ENCOUNTER — Ambulatory Visit: Payer: Self-pay

## 2019-02-18 ENCOUNTER — Encounter: Payer: Self-pay | Admitting: *Deleted

## 2019-02-18 ENCOUNTER — Emergency Department: Payer: BLUE CROSS/BLUE SHIELD

## 2019-02-18 DIAGNOSIS — J189 Pneumonia, unspecified organism: Principal | ICD-10-CM | POA: Diagnosis present

## 2019-02-18 DIAGNOSIS — J45909 Unspecified asthma, uncomplicated: Secondary | ICD-10-CM | POA: Diagnosis not present

## 2019-02-18 DIAGNOSIS — I959 Hypotension, unspecified: Secondary | ICD-10-CM | POA: Diagnosis not present

## 2019-02-18 DIAGNOSIS — D509 Iron deficiency anemia, unspecified: Secondary | ICD-10-CM | POA: Diagnosis not present

## 2019-02-18 DIAGNOSIS — Z79899 Other long term (current) drug therapy: Secondary | ICD-10-CM

## 2019-02-18 DIAGNOSIS — R079 Chest pain, unspecified: Secondary | ICD-10-CM

## 2019-02-18 DIAGNOSIS — Z20828 Contact with and (suspected) exposure to other viral communicable diseases: Secondary | ICD-10-CM | POA: Diagnosis not present

## 2019-02-18 DIAGNOSIS — I313 Pericardial effusion (noninflammatory): Secondary | ICD-10-CM

## 2019-02-18 DIAGNOSIS — R0602 Shortness of breath: Secondary | ICD-10-CM | POA: Diagnosis not present

## 2019-02-18 DIAGNOSIS — R0609 Other forms of dyspnea: Secondary | ICD-10-CM | POA: Diagnosis not present

## 2019-02-18 DIAGNOSIS — R51 Headache: Secondary | ICD-10-CM | POA: Diagnosis present

## 2019-02-18 DIAGNOSIS — Z8661 Personal history of infections of the central nervous system: Secondary | ICD-10-CM

## 2019-02-18 DIAGNOSIS — I3139 Other pericardial effusion (noninflammatory): Secondary | ICD-10-CM

## 2019-02-18 DIAGNOSIS — T7840XA Allergy, unspecified, initial encounter: Secondary | ICD-10-CM | POA: Diagnosis not present

## 2019-02-18 DIAGNOSIS — R05 Cough: Secondary | ICD-10-CM | POA: Diagnosis not present

## 2019-02-18 DIAGNOSIS — R0789 Other chest pain: Secondary | ICD-10-CM | POA: Diagnosis not present

## 2019-02-18 LAB — INFLUENZA PANEL BY PCR (TYPE A & B)
INFLBPCR: NEGATIVE
Influenza A By PCR: NEGATIVE

## 2019-02-18 LAB — COMPREHENSIVE METABOLIC PANEL
ALT: 17 U/L (ref 0–44)
AST: 15 U/L (ref 15–41)
Albumin: 3.7 g/dL (ref 3.5–5.0)
Alkaline Phosphatase: 43 U/L (ref 38–126)
Anion gap: 8 (ref 5–15)
BUN: 8 mg/dL (ref 6–20)
CO2: 24 mmol/L (ref 22–32)
Calcium: 8.8 mg/dL — ABNORMAL LOW (ref 8.9–10.3)
Chloride: 106 mmol/L (ref 98–111)
Creatinine, Ser: 0.73 mg/dL (ref 0.44–1.00)
GFR calc Af Amer: >60 mL/min (ref >60)
GFR calc non Af Amer: >60 mL/min (ref >60)
Glucose, Bld: 98 mg/dL (ref 70–99)
Potassium: 3.5 mmol/L (ref 3.5–5.1)
Sodium: 138 mmol/L (ref 135–145)
Total Bilirubin: 0.3 mg/dL (ref 0.3–1.2)
Total Protein: 6.8 g/dL (ref 6.5–8.1)

## 2019-02-18 LAB — CBC WITH DIFFERENTIAL/PLATELET
Abs Immature Granulocytes: 0.01 10*3/uL (ref 0.00–0.07)
Basophils Absolute: 0 10*3/uL (ref 0.0–0.1)
Basophils Relative: 1 %
EOS ABS: 0.1 10*3/uL (ref 0.0–0.5)
Eosinophils Relative: 3 %
HCT: 36.5 % (ref 36.0–46.0)
Hemoglobin: 11.5 g/dL — ABNORMAL LOW (ref 12.0–15.0)
Immature Granulocytes: 0 %
Lymphocytes Relative: 36 %
Lymphs Abs: 1.4 10*3/uL (ref 0.7–4.0)
MCH: 26.7 pg (ref 26.0–34.0)
MCHC: 31.5 g/dL (ref 30.0–36.0)
MCV: 84.9 fL (ref 80.0–100.0)
Monocytes Absolute: 0.5 10*3/uL (ref 0.1–1.0)
Monocytes Relative: 12 %
NEUTROS PCT: 48 %
Neutro Abs: 2 10*3/uL (ref 1.7–7.7)
Platelets: 254 10*3/uL (ref 150–400)
RBC: 4.3 MIL/uL (ref 3.87–5.11)
RDW: 14.6 % (ref 11.5–15.5)
WBC: 4 10*3/uL (ref 4.0–10.5)
nRBC: 0 % (ref 0.0–0.2)

## 2019-02-18 LAB — PROCALCITONIN: Procalcitonin: <0.1 ng/mL

## 2019-02-18 LAB — TROPONIN I: Troponin I: <0.03 ng/mL (ref <0.03)

## 2019-02-18 LAB — BRAIN NATRIURETIC PEPTIDE: B Natriuretic Peptide: 12 pg/mL (ref 0.0–100.0)

## 2019-02-18 LAB — LACTIC ACID, PLASMA: Lactic Acid, Venous: 0.7 mmol/L (ref 0.5–1.9)

## 2019-02-18 MED ORDER — ACETAMINOPHEN 325 MG PO TABS
650.0000 mg | ORAL_TABLET | Freq: Four times a day (QID) | ORAL | Status: DC | PRN
  Administered 2019-02-19: 650 mg via ORAL
  Filled 2019-02-18 (×3): qty 2

## 2019-02-18 MED ORDER — ONDANSETRON HCL 4 MG/2ML IJ SOLN: 4.0000 mg | Freq: Four times a day (QID) | INTRAMUSCULAR | Status: DC | PRN

## 2019-02-18 MED ORDER — SODIUM CHLORIDE 0.9 % IV SOLN
1.0000 g | Freq: Once | INTRAVENOUS | Status: AC
  Administered 2019-02-18: 1 g via INTRAVENOUS
  Filled 2019-02-18: qty 10

## 2019-02-18 MED ORDER — SODIUM CHLORIDE 0.9 % IV SOLN
500.0000 mg | Freq: Once | INTRAVENOUS | Status: AC
  Administered 2019-02-18: 500 mg via INTRAVENOUS
  Filled 2019-02-18: qty 500

## 2019-02-18 MED ORDER — SODIUM CHLORIDE 0.9 % IV SOLN
1.0000 g | INTRAVENOUS | Status: DC
  Administered 2019-02-19: 1 g via INTRAVENOUS
  Filled 2019-02-18: qty 10
  Filled 2019-02-18: qty 1

## 2019-02-18 MED ORDER — SODIUM CHLORIDE 0.9 % IV SOLN
500.0000 mg | INTRAVENOUS | Status: DC
  Filled 2019-02-18: qty 500

## 2019-02-18 MED ORDER — ENOXAPARIN SODIUM 40 MG/0.4ML ~~LOC~~ SOLN
40.0000 mg | SUBCUTANEOUS | Status: DC
  Filled 2019-02-18: qty 0.4

## 2019-02-18 MED ORDER — ONDANSETRON HCL 4 MG PO TABS: 4.0000 mg | ORAL_TABLET | Freq: Four times a day (QID) | ORAL | Status: DC | PRN

## 2019-02-18 NOTE — ED Notes (Signed)
ED TO INPATIENT HANDOFF REPORT  ED Nurse Name and Phone #: Idalia Needle [phone]/Leul Narramore 5731  S Name/Age/Gender Katie Hays 38 y.o. female Room/Bed: ED31A/ED31A  Code Status   Code Status: Full Code  Home/SNF/Other Home Patient oriented to: self, place, time and situation Is this baseline? Yes   Triage Complete: Triage complete  Chief Complaint PARK1/ Cough  Triage Note  Pt to ED reporting continued body aches, SOB, productive cough with green phlegm and chest pain since Tuesday. Pt was seen for same symptoms and reports they have become worse. Intermittent headaches, fatigue and chills also reported. No fever at home that pt is aware of. PT is afebrile in tent. No known exposure and no travel.   Pt was seen in ED on Tuesday and seen by PCP twice. Pt has taken prednisone without improvement. No testing for flu or COVID    Allergies Allergies  Allergen Reactions  . Dilaudid [Hydromorphone Hcl] Swelling    Level of Care/Admitting Diagnosis ED Disposition    ED Disposition Condition Comment   Admit  Hospital Area: [facility] [[id number]]  Level of Care: Med-Surg [16]  Diagnosis: Pneumonia [227785]  Admitting Physician: Enid Baas [[id number]]  Attending Physician: Enid Baas [[id number]]  Estimated length of stay: past midnight tomorrow  Certification:: I certify this patient will need inpatient services for at least 2 midnights  PT Class (Do Not Modify): Inpatient [101]  PT Acc Code (Do Not Modify): Private [1]       B Medical/Surgery History Past Medical History:  Diagnosis Date  . Anemia   . History of meningitis   . Pericardial effusion    Past Surgical History:  Procedure Laterality Date  . CESAREAN SECTION  04/30/2016   04/16/2012  . TUBAL LIGATION Bilateral 04/30/2016     A IV Location/Drains/Wounds Patient Lines/Drains/Airways Status   Active Line/Drains/Airways    Name:   Placement date:   Placement time:   Site:    Days:   Peripheral IV 02/18/19 Left Hand   02/18/19    2005    Hand   less than 1          Intake/Output Last 24 hours No intake or output data in the 24 hours ending 02/18/19 2140  Labs/Imaging Results for orders placed or performed during the hospital encounter of 02/18/19 (from the past 48 hour(s))  CBC with Differential/Platelet     Status: Abnormal   Collection Time: 02/18/19  5:59 PM  Result Value Ref Range   WBC 4.0 4.0 - 10.5 K/uL   RBC 4.30 3.87 - 5.11 MIL/uL   Hemoglobin 11.5 (L) 12.0 - 15.0 g/dL   HCT 97.9 89.2 - 11.9 %   MCV 84.9 80.0 - 100.0 fL   MCH 26.7 26.0 - 34.0 pg   MCHC 31.5 30.0 - 36.0 g/dL   RDW 41.7 40.8 - 14.4 %   Platelets 254 150 - 400 K/uL   nRBC 0.0 0.0 - 0.2 %   Neutrophils Relative % 48 %   Neutro Abs 2.0 1.7 - 7.7 K/uL   Lymphocytes Relative 36 %   Lymphs Abs 1.4 0.7 - 4.0 K/uL   Monocytes Relative 12 %   Monocytes Absolute 0.5 0.1 - 1.0 K/uL   Eosinophils Relative 3 %   Eosinophils Absolute 0.1 0.0 - 0.5 K/uL   Basophils Relative 1 %   Basophils Absolute 0.0 0.0 - 0.1 K/uL   Immature Granulocytes 0 %   Abs Immature Granulocytes 0.01 0.00 - 0.07  K/uL    Comment: Performed at [facility], [address] [place], [place] [address]  Lactic acid, plasma     Status: None   Collection Time: 02/18/19  5:59 PM  Result Value Ref Range   Lactic Acid, Venous 0.7 0.5 - 1.9 mmol/L    Comment: Performed at [facility], [address] [place], [place] [address]  Troponin I - ONCE - STAT     Status: None   Collection Time: 02/18/19  5:59 PM  Result Value Ref Range   Troponin I <0.03 <0.03 ng/mL    Comment: Performed at [facility], [address] [place], [place] [address]  Comprehensive metabolic panel     Status: Abnormal   Collection Time: 02/18/19  5:59 PM  Result Value Ref Range   Sodium 138 135 - 145 mmol/L   Potassium 3.5 3.5 - 5.1 mmol/L   Chloride 106 98 - 111 mmol/L   CO2 24 22 - 32 mmol/L   Glucose,  Bld 98 70 - 99 mg/dL   BUN 8 6 - 20 mg/dL   Creatinine, Ser 9.37 0.44 - 1.00 mg/dL   Calcium 8.8 (L) 8.9 - 10.3 mg/dL   Total Protein 6.8 6.5 - 8.1 g/dL   Albumin 3.7 3.5 - 5.0 g/dL   AST 15 15 - 41 U/L   ALT 17 0 - 44 U/L   Alkaline Phosphatase 43 38 - 126 U/L   Total Bilirubin 0.3 0.3 - 1.2 mg/dL   GFR calc non Af Amer >60 >60 mL/min   GFR calc Af Amer >60 >60 mL/min   Anion gap 8 5 - 15    Comment: Performed at [facility], [address] [place], [place] [address]  Procalcitonin     Status: None   Collection Time: 02/18/19  5:59 PM  Result Value Ref Range   Procalcitonin <0.10 ng/mL    Comment:        Interpretation: PCT (Procalcitonin) <= 0.5 ng/mL: Systemic infection (sepsis) is not likely. Local bacterial infection is possible. (NOTE)       Sepsis PCT Algorithm           Lower Respiratory Tract                                      Infection PCT Algorithm    ----------------------------     ----------------------------         PCT < 0.25 ng/mL                PCT < 0.10 ng/mL         Strongly encourage             Strongly discourage   discontinuation of antibiotics    initiation of antibiotics    ----------------------------     -----------------------------       PCT 0.25 - 0.50 ng/mL            PCT 0.10 - 0.25 ng/mL               OR       >80% decrease in PCT            Discourage initiation of  antibiotics      Encourage discontinuation           of antibiotics    ----------------------------     -----------------------------         PCT >= 0.50 ng/mL              PCT 0.26 - 0.50 ng/mL               AND        <80% decrease in PCT             Encourage initiation of                                             antibiotics       Encourage continuation           of antibiotics    ----------------------------     -----------------------------        PCT >= 0.50 ng/mL                  PCT > 0.50 ng/mL                AND         increase in PCT                  Strongly encourage                                      initiation of antibiotics    Strongly encourage escalation           of antibiotics                                     -----------------------------                                           PCT <= 0.25 ng/mL                                                 OR                                        > 80% decrease in PCT                                     Discontinue / Do not initiate                                             antibiotics Performed at [facility], [address] [place], [place] [address]   Brain natriuretic peptide     Status: None   Collection Time: 02/18/19  6:00 PM  Result Value Ref Range   B Natriuretic Peptide 12.0 0.0 - 100.0 pg/mL    Comment: Performed at [facility], [address] [place], [place] [address]   Dg Chest 1 View  Result Date: 02/18/2019 CLINICAL DATA:  Body aches, shortness of breath, productive cough, fever, and chills. EXAM: CHEST  1 VIEW COMPARISON:  Chest CT 02/15/2019 FINDINGS: The cardiomediastinal silhouette is within normal limits. Mild patchy airspace opacity is present in the left greater than right lower lungs with evidence of mild retrocardiac consolidation in the left lower lobe. No pleural effusion or pneumothorax is identified. No acute osseous abnormality is seen. IMPRESSION: Left greater than right lower lung airspace opacities compatible with pneumonia. Electronically Signed   By: Sebastian AcheAllen  Grady M.D.   On: 02/18/2019 18:05    Pending Labs Unresulted Labs (From admission, onward)    Start     Ordered   02/18/19 2137  Respiratory Panel by PCR  Add-on,   AD    Question:  Patient immune status  Answer:  Normal   02/18/19 2136   02/18/19 2137  Influenza panel by PCR (type A & B)  Add-on,   AD     02/18/19 2136   02/18/19 2133  Influenza panel by PCR (type A & B)  ONCE - STAT,   STAT     02/18/19 2132    02/18/19 2133  Respiratory Panel by PCR  (Respiratory virus panel with precautions)  Once,   STAT     02/18/19 2132   02/18/19 2132  Novel Coronavirus, NAA (hospital order; send-out to ref lab)  (Novel Coronavirus, NAA [facility](Hospital Order; send-out to ref lab) with precautions panel)  Once,   STAT    Question Answer Comment  Current symptoms Fever and Shortness of breath   Excluded other viral illnesses Yes   Exposure Risk None      02/18/19 2132   02/18/19 1924  Blood culture (routine x 2)  BLOOD CULTURE X 2,   STAT     02/18/19 1923   Signed and Held  Novel Coronavirus, NAA (hospital order; send-out to ref lab)  (CHL IP COVID ADMIT NOVEL CORONAVIRUS, NAA (HOSPITAL ORDER; SEND-OUT TO REF LAB))  Once,   R    Question Answer Comment  Current symptoms Fever and Cough   Excluded other viral illnesses No (testing not indicated)   Patient immune status Normal      Signed and Held          Vitals/Pain Today's Vitals   02/18/19 1629 02/18/19 1630 02/18/19 1915 02/18/19 2130  BP: (!) 107/44  97/82 (!) 105/48  Pulse: 81  64 70  Resp: 16  19 (!) 22  Temp: 98.7 F (37.1 C)  98.2 F (36.8 C) 98 F (36.7 C)  TempSrc: Oral  Oral Oral  SpO2: 100%  100% 100%  Weight:  74.7 kg    Height:  5\' 6"  (1.676 m)    PainSc:  9       Isolation Precautions Airborne and Contact precautions  Medications Medications  cefTRIAXone (ROCEPHIN) 1 g in sodium chloride 0.9 % 100 mL IVPB (1 g Intravenous Not Given 02/18/19 2131)  azithromycin (ZITHROMAX) 500 mg in sodium chloride 0.9 % 250 mL IVPB (500 mg Intravenous Not Given 02/18/19 2131)  enoxaparin (LOVENOX) injection 40 mg (has no administration in time range)  acetaminophen (TYLENOL) tablet 650 mg (has no administration in time range)  ondansetron (ZOFRAN) tablet 4 mg (has no  administration in time range)    Or  ondansetron (ZOFRAN) injection 4 mg (has no administration in time range)  cefTRIAXone (ROCEPHIN) 1 g in sodium chloride 0.9 % 100 mL IVPB (0  g Intravenous Stopped 02/18/19 2116)  azithromycin (ZITHROMAX) 500 mg in sodium chloride 0.9 % 250 mL IVPB (500 mg Intravenous New Bag/Given 02/18/19 2022)    Mobility walks Low fall risk   Focused Assessments Renal Assessment Handoff:  Hemodialysis Schedule: Last Hemodialysis date and time:    Restricted appendage:  , Pulmonary Assessment Handoff:  Lung sounds:   O2 Device: Room Air        R Recommendations: See Admitting Provider Note  Report given to:   Additional Notes:

## 2019-02-18 NOTE — ED Notes (Addendum)
Body aches, chills, cough, sob X 1 week. Seen in ER on Tuesday. EDP at bedside. Has been prescribed prednisone by PCP.  Pt appears to have exertional SOB with talking, stopping to take breaths in between sentences. Patient does not appear to be in any distress.  No known exposure to COVID 19. No travel since the beginning of February.

## 2019-02-18 NOTE — H&P (Signed)
[facility] [place] [facility]   PATIENT NAME: Katie Hays    MR#:  [id number]  DATE OF BIRTH:  [birth year]  DATE OF ADMISSION:  02/18/2019  PRIMARY CARE PHYSICIAN: Alba Cory, MD   REQUESTING/REFERRING PHYSICIAN: Dr. Loleta Rose  CHIEF COMPLAINT:   Chief Complaint  Patient presents with  . Generalized Body Aches  . Chest Pain  . Shortness of Breath    HISTORY OF PRESENT ILLNESS:  Katie Hays  is a 38 y.o. female with a known history of iron deficiency anemia presents to hospital secondary to worsening shortness of breath, cough and body aches for 4 days now. Symptoms started 4 to 5 days ago, patient denies any travel or sick contacts recently.  She has seen her PCP twice within the last week with the symptoms.  Was started on outpatient prednisone did not improve her symptoms.  Continues to complain of significant body aches, subjective fevers at home.  Cough was initially dry and occasional productive with greenish phlegm.  Continues to have shortness of breath. -  Not hypoxic in the emergency room.  Afebrile here.  WBC count is within normal limits.  Procalcitonin is negative.  Chest x-ray with bilateral lower lobe infiltrates consistent with pneumonia. -Given her symptoms, normal white count and negative procalcitonin-COVID-19 test has been ordered, and will need isolation.   PAST MEDICAL HISTORY:   Past Medical History:  Diagnosis Date  . Anemia   . History of meningitis   . Pericardial effusion     PAST SURGICAL HISTORY:   Past Surgical History:  Procedure Laterality Date  . CESAREAN SECTION  04/30/2016   04/16/2012  . TUBAL LIGATION Bilateral 04/30/2016    SOCIAL HISTORY:   Social History   Tobacco Use  . Smoking status: Never Smoker  . Smokeless tobacco: Never Used  Substance Use Topics  . Alcohol use: Never    Frequency: Never    Comment: never    FAMILY HISTORY:   Family History  Problem Relation Age of Onset   . Arthritis Mother   . Heart disease Father   . Diabetes Father   . Hypertension Father   . Asthma Sister   . Hypertension Sister   . Diabetes Sister   . Asthma Brother   . Heart attack Brother   . Asthma Sister   . Hypertension Sister   . Heart attack Sister     DRUG ALLERGIES:   Allergies  Allergen Reactions  . Dilaudid [Hydromorphone Hcl] Swelling    REVIEW OF SYSTEMS:   Review of Systems  Constitutional: Positive for chills and fever. Negative for malaise/fatigue and weight loss.  HENT: Negative for ear discharge, ear pain, hearing loss, nosebleeds and tinnitus.   Eyes: Negative for blurred vision, double vision and photophobia.  Respiratory: Positive for cough and shortness of breath. Negative for hemoptysis and wheezing.   Cardiovascular: Negative for chest pain, palpitations, orthopnea and leg swelling.  Gastrointestinal: Positive for nausea. Negative for abdominal pain, constipation, diarrhea, heartburn, melena and vomiting.  Genitourinary: Negative for dysuria, frequency, hematuria and urgency.  Musculoskeletal: Positive for myalgias. Negative for back pain and neck pain.  Skin: Negative for rash.  Neurological: Negative for dizziness, tingling, sensory change, speech change, focal weakness and headaches.  Endo/Heme/Allergies: Does not bruise/bleed easily.  Psychiatric/Behavioral: Negative for depression.    MEDICATIONS AT HOME:   Prior to Admission medications   Medication Sig Start Date End Date Taking? Authorizing Provider  acetaminophen-codeine (TYLENOL #3) 300-30 MG  tablet Take 1 tablet by mouth every 4 (four) hours as needed for moderate pain. 02/15/19  Yes Sowles, Danna Hefty, MD  cetirizine (ZYRTEC) 10 MG tablet Take 10 mg by mouth daily.   Yes [provider]  ferrous sulfate 325 (65 FE) MG tablet Take 1 tablet (325 mg total) by mouth daily with breakfast. 02/08/19  Yes Sowles, Danna Hefty, MD  meloxicam (MOBIC) 7.5 MG tablet Take 1 tablet (7.5 mg total)  by mouth 2 (two) times daily as needed for pain. 02/15/19 02/15/20 Yes Paduchowski, Caryn Bee, MD  montelukast (SINGULAIR) 10 MG tablet Take 1 tablet (10 mg total) by mouth at bedtime. 02/15/19  Yes Alba Cory, MD  Multiple Vitamins-Minerals (MULTIVITAMIN GUMMIES WOMENS PO) Take 2 each by mouth daily. [employer]   Yes [provider]      VITAL SIGNS:  Blood pressure (!) 105/48, pulse 70, temperature 98 F (36.7 C), temperature source Oral, resp. rate (!) 22, height 5\' 6"  (1.676 m), weight 74.7 kg, last menstrual period 01/12/2019, SpO2 100 %.  PHYSICAL EXAMINATION:   Physical Exam  GENERAL:  38 y.o.-year-old patient lying in the bed with no acute distress.  EYES: Pupils equal, round, reactive to light and accommodation. No scleral icterus. Extraocular muscles intact.  HEENT: Head atraumatic, normocephalic. Oropharynx and nasopharynx clear.  NECK:  Supple, no jugular venous distention. No thyroid enlargement, no tenderness.  LUNGS: Normal breath sounds bilaterally, no wheezing, rales,rhonchi or crepitation. No use of accessory muscles of respiration.  CARDIOVASCULAR: S1, S2 normal. No murmurs, rubs, or gallops.  ABDOMEN: Soft, nontender, nondistended. Bowel sounds present. No organomegaly or mass.  EXTREMITIES: No pedal edema, cyanosis, or clubbing.  NEUROLOGIC: Cranial nerves II through XII are intact. Muscle strength 5/5 in all extremities. Sensation intact. Gait not checked.  PSYCHIATRIC: The patient is alert and oriented x 3.  SKIN: No obvious rash, lesion, or ulcer.   LABORATORY PANEL:   CBC Recent Labs  Lab 02/18/19 1759  WBC 4.0  HGB 11.5*  HCT 36.5  PLT 254   ------------------------------------------------------------------------------------------------------------------  Chemistries  Recent Labs  Lab 02/18/19 1759  NA 138  K 3.5  CL 106  CO2 24  GLUCOSE 98  BUN 8  CREATININE 0.73  CALCIUM 8.8*  AST 15  ALT 17  ALKPHOS 43  BILITOT 0.3    ------------------------------------------------------------------------------------------------------------------  Cardiac Enzymes Recent Labs  Lab 02/18/19 1759  TROPONINI <0.03   ------------------------------------------------------------------------------------------------------------------  RADIOLOGY:  Dg Chest 1 View  Result Date: 02/18/2019 CLINICAL DATA:  Body aches, shortness of breath, productive cough, fever, and chills. EXAM: CHEST  1 VIEW COMPARISON:  Chest CT 02/15/2019 FINDINGS: The cardiomediastinal silhouette is within normal limits. Mild patchy airspace opacity is present in the left greater than right lower lungs with evidence of mild retrocardiac consolidation in the left lower lobe. No pleural effusion or pneumothorax is identified. No acute osseous abnormality is seen. IMPRESSION: Left greater than right lower lung airspace opacities compatible with pneumonia. Electronically Signed   By: Sebastian Ache M.D.   On: 02/18/2019 18:05    EKG:   Orders placed or performed during the hospital encounter of 02/18/19  . ED EKG  . ED EKG    IMPRESSION AND PLAN:   Katie Hays  is a 38 y.o. female with a known history of iron deficiency anemia presents to hospital secondary to worsening shortness of breath, cough and body aches for 4 days now.  1.  Community-acquired pneumonia-presenting with productive cough with greenish phlegm.  Subjective fevers only.  Chest x-ray with bilateral infiltrates, left greater than right. -Follow-up blood cultures.  Started on Rocephin and azithromycin -Given normal white count and negative procalcitonin-sent flu, respiratory virus panel and COVID-19 test. -Continue isolation-low risk  2.  Pericardial effusion-history of pericardial effusion.  However on the CT angiogram done 2 days ago in the emergency room showing slightly increasing in size.  Echocardiogram has been ordered. -Follow-up echocardiogram, if worsening effusion noted-can  consult cardiology -No evidence of any Tamponade  3.  Chronic iron  deficiency anemia-stable.  4.  DVT prophylaxis-Lovenox    All the records are reviewed and case discussed with ED provider. Management plans discussed with the patient, family and they are in agreement.  CODE STATUS: Full Code  TOTAL TIME TAKING CARE OF THIS PATIENT: 52 minutes.    Enid Baas M.D on 02/18/2019 at 10:02 PM  Between 7am to 6pm - Pager - [phone]  After 6pm go to [email or web address] - Social research officer, government  [facility]  Office  [phone]  CC: Primary care physician; Alba Cory, MD

## 2019-02-18 NOTE — Telephone Encounter (Signed)
Patient called and says she's was in the ED on 02/15/19 for CP/SOB, was worked up for heart and everything was negative, her SOB went away. She says last night she developed chills, body aches this morning, SOB last night at rest, a dry cough, and a headache that is not relieved by Tylenol w/Codeine or ES Tylenol. As she's on the phone talking, she's stopping every few words to breathe, obvious trouble breathing during triage. I asked about COVID-19 exposure, travel to high risk areas, she denies. I advised to go to the ED and I will send this to Dr. Carlynn Purl to make her aware, patient verbalized understanding and says her husband will take her to [facility].

## 2019-02-18 NOTE — ED Provider Notes (Signed)
[facility] Emergency Department Provider Note  ____________________________________________   First MD Initiated Contact with Patient 02/18/19 1738     (approximate)  I have reviewed the triage vital signs and the nursing notes.   HISTORY  Chief Complaint Generalized Body Aches; Chest Pain; and Shortness of Breath     HPI Katie Hays is a 38 y.o. female with medical history as listed below who presents for evaluation of worsening shortness of breath, chest pain, generalized body aches/myalgias, and productive cough.  The symptoms have been present for about a week.  She saw her primary care doctor twice last week.  She came to the emergency department 3 days ago for evaluation of similar symptoms but also a syncopal episode.  She had a reassuring work-up at that time which indicated a pericardial effusion which was greater than it had been in the past but appears to have been gradually worsening over extended period of time.  She had no evidence of pulmonary embolism.  Her work-up was generally reassuring and she was thought to have a viral illness so she was discharged home but her symptoms of continue to get worse.  She feels significantly short of breath and has a persistent cough as well as both sharp pain in her chest and heaviness.  She describes the symptoms as severe nothing particular is making them better, exertion makes them worse.  She called her primary care doctor's office today and was told to come to the emergency department.   Past Medical History:  Diagnosis Date   Anemia    History of meningitis    Pericardial effusion     Patient Active Problem List   Diagnosis Date Noted   Vitamin D deficiency 01/03/2019   Family history of heart disease in female family member before age 43 07/04/2016   MVP (mitral valve prolapse) 10/28/2015   Migraine headache 10/25/2015   H/O cesarean section 12/02/2012   History of blood transfusion  12/02/2012    Past Surgical History:  Procedure Laterality Date   CESAREAN SECTION  04/30/2016   04/16/2012   TUBAL LIGATION Bilateral 04/30/2016    Prior to Admission medications   Medication Sig Start Date End Date Taking? Authorizing Provider  acetaminophen-codeine (TYLENOL #3) 300-30 MG tablet Take 1 tablet by mouth every 4 (four) hours as needed for moderate pain. 02/15/19   Alba Takera Rayl, MD  cetirizine (ZYRTEC) 10 MG tablet Take 10 mg by mouth daily.    [provider]  cyclobenzaprine (FLEXERIL) 5 MG tablet Take 1 tablet (5 mg total) by mouth 3 (three) times daily as needed for muscle spasms. Patient not taking: Reported on 02/15/2019 01/18/19   Felecia Shelling, DPM  ferrous sulfate 325 (65 FE) MG tablet Take 1 tablet (325 mg total) by mouth daily with breakfast. 02/08/19   Alba Adaiah Morken, MD  meloxicam (MOBIC) 7.5 MG tablet Take 1 tablet (7.5 mg total) by mouth 2 (two) times daily as needed for pain. 02/15/19 02/15/20  Minna Antis, MD  montelukast (SINGULAIR) 10 MG tablet Take 1 tablet (10 mg total) by mouth at bedtime. 02/15/19   Alba Aariv Medlock, MD  Multiple Vitamins-Minerals (MULTIVITAMIN GUMMIES WOMENS PO) Take 2 each by mouth daily. [employer]    [provider]  polyethylene glycol powder (GLYCOLAX/MIRALAX) powder Take 17 g by mouth 2 (two) times daily as needed. 02/08/19   Alba Azaryah Oleksy, MD  traMADol (ULTRAM) 50 MG tablet Take 1 tablet (50 mg total) by mouth every 8 (  eight) hours as needed. 02/08/19   Alba Khylon Davies, MD  triamcinolone cream (KENALOG) 0.1 % Apply 1 application topically 2 (two) times daily. 12/03/18   Alba Yamel Bale, MD  Vitamin D, Ergocalciferol, (DRISDOL) 1.25 MG (50000 UT) CAPS capsule Take 1 capsule (50,000 Units total) by mouth every 7 (seven) days. 12/05/18   Alba Stefanee Mckell, MD    Allergies Dilaudid [hydromorphone hcl]  Family History  Problem Relation Age of Onset   Arthritis Mother    Heart disease Father     Diabetes Father    Hypertension Father    Asthma Sister    Hypertension Sister    Diabetes Sister    Asthma Brother    Heart attack Brother    Asthma Sister    Hypertension Sister    Heart attack Sister     Social History Social History   Tobacco Use   Smoking status: Never Smoker   Smokeless tobacco: Never Used  Substance Use Topics   Alcohol use: Never    Frequency: Never    Comment: never   Drug use: No    Review of Systems Constitutional:    -- General malaise:  Yes    -- Myalgias/bodyaches:  Yes    -- Fever/chills:  Yes   (subjective)   --  Eyes: No visual changes. ENT:    --  Nasal congestion/runny nose:  No    --  Sore throat:  No    --  Earaches:  No  Cardiovascular:    --  Chest pain:  Yes   Worse with deep inspiration or movement:  yes   --  Syncope / passing out:  Yes  Respiratory:   --  Shortness of breath:  Yes    --  Cough:  Yes  Productive?:  yes   --  Wheezing:  No  Gastrointestinal:    --  Abdominal pain:  No    --  Nausea and/or vomiting:  No    --  Diarrhea:  No    --   Genitourinary: Negative for dysuria and hematuria. Musculoskeletal: No complaints at this time. Integumentary: Negative for rash, abrasions, and lacerations. Neurological:    --  Headache: Yes.    --  Focal weakness or numbness:  No Psychiatric: No acute concerns at this time.  ____________________________________________   PHYSICAL EXAM:  VITAL SIGNS: ED Triage Vitals  Enc Vitals Group     BP 02/18/19 1629 (!) 107/44     Pulse Rate 02/18/19 1629 81     Resp 02/18/19 1629 16     Temp 02/18/19 1629 98.7 F (37.1 C)     Temp Source 02/18/19 1629 Oral     SpO2 02/18/19 1629 100 %     Weight 02/18/19 1630 74.7 kg (164 lb 12.2 oz)     Height 02/18/19 1630 1.676 m (5\' 6" )     Head Circumference --      Peak Flow --      Pain Score 02/18/19 1630 9     Pain Loc --      Pain Edu? --      Excl. in GC? --     Constitutional: Alert and oriented.   Generally well-appearing and not in acute distress. Eyes: Conjunctivae are normal.  Head: Atraumatic. Nose: No congestion/rhinnorhea. Mouth/Throat: Mucous membranes are moist. Neck: No stridor.  No meningeal signs.   Cardiovascular: Normal rate, regular rhythm. Good peripheral circulation. Respiratory: Increased work of breathing but without retractions.  She seems to  take very deep breaths every few seconds but does not seem to be in distress in between.  No tachypnea.  No wheezing. Gastrointestinal: Soft and nontender. No distention.  Musculoskeletal: No lower extremity tenderness nor edema. No gross deformities of extremities. Neurologic:  Normal speech and language. No gross focal neurologic deficits are appreciated.  Skin:  Skin is warm, dry and intact. No rash noted. Psychiatric: Mood and affect are normal under the circumstances.  No acute/emergent concerns nor abnormalities.  ____________________________________________   LABS (all labs ordered are listed, but only abnormal results are displayed)  Labs Reviewed  CBC WITH DIFFERENTIAL/PLATELET - Abnormal; Notable for the following components:      Result Value   Hemoglobin 11.5 (*)    All other components within normal limits  COMPREHENSIVE METABOLIC PANEL - Abnormal; Notable for the following components:   Calcium 8.8 (*)    All other components within normal limits  CULTURE, BLOOD (ROUTINE X 2)  CULTURE, BLOOD (ROUTINE X 2)  LACTIC ACID, PLASMA  TROPONIN I  PROCALCITONIN  BRAIN NATRIURETIC PEPTIDE   ____________________________________________  EKG  ED ECG REPORT I, Loleta Rose, the attending physician, personally viewed and interpreted this ECG.  Date: 02/18/2019 EKG Time: 18: 04 Rate: 74 Rhythm: normal sinus rhythm QRS Axis: normal Intervals: Incomplete right bundle branch block ST/T Wave abnormalities: normal Narrative Interpretation: no evidence of acute  ischemia  ____________________________________________  RADIOLOGY I, Loleta Rose, personally viewed and evaluated these images (plain radiographs) as part of my medical decision making, as well as reviewing the written report by the radiologist.  ED MD interpretation: Multifocal pneumonia  Official radiology report(s): Dg Chest 1 View  Result Date: 02/18/2019 CLINICAL DATA:  Body aches, shortness of breath, productive cough, fever, and chills. EXAM: CHEST  1 VIEW COMPARISON:  Chest CT 02/15/2019 FINDINGS: The cardiomediastinal silhouette is within normal limits. Mild patchy airspace opacity is present in the left greater than right lower lungs with evidence of mild retrocardiac consolidation in the left lower lobe. No pleural effusion or pneumothorax is identified. No acute osseous abnormality is seen. IMPRESSION: Left greater than right lower lung airspace opacities compatible with pneumonia. Electronically Signed   By: Sebastian Ache M.D.   On: 02/18/2019 18:05    ____________________________________________   PROCEDURES   Procedure(s) performed (including Critical Care):  Procedures   ____________________________________________   INITIAL IMPRESSION / MDM / ASSESSMENT AND PLAN / ED COURSE  As part of my medical decision making, I reviewed the following data within the electronic MEDICAL RECORD NUMBER Nursing notes reviewed and incorporated, Labs reviewed , EKG interpreted , Old chart reviewed, Radiograph reviewed , Discussed with admitting physician (Dr. Nemiah Commander)  and Notes from prior ED visits   MYSTY MORSEY was evaluated in Emergency Department on 02/18/2019 for the symptoms described in the history of present illness. She was evaluated in the context of the global COVID-19 pandemic, which necessitated consideration that the patient might be at risk for infection with the SARS-CoV-2 virus that causes COVID-19. Institutional protocols and algorithms that pertain to the evaluation  of patients at risk for COVID-19 are in a state of rapid change based on information released by regulatory bodies including the CDC and federal and state organizations. These policies and algorithms were followed during the patient's care in the ED.  ---------------------    Differential diagnosis includes, but is not limited to, viral illness, pneumonia, pericarditis/myocarditis, pericardial effusion that is worsening acutely, COVID-19.  The patient has no known COVID exposures and  is low risk but she has been around people that have traveled to other areas.  She has been to her primary care doctor at least twice and had a recent visit to the emergency department with a thorough evaluation and had no sign of pneumonia.  Now, 3 days later, she has multifocal pneumonia on her chest x-ray and worsening symptoms.  Additionally, she tried to follow-up with cardiology but because of the COVID-19 pandemic the hours are limited and she has been unable to get in for an echocardiogram.  Echocardiograms are not available in the emergency department tonight.  Given her acute pneumonia, persistent symptoms of shortness of breath, fatigue, subjective fever, productive cough, and chest pain with a known pericardial effusion, I will admit her for further evaluation including antibiotics and cardiology consult and echocardiogram.  I have ordered community-acquired pneumonia treatment including ceftriaxone 1 g IV and azithromycin 500 mg IV.  She does not require fluids at this time and is not septic.  I discussed the case in person with Dr. Nemiah Commander with the hospitalist service who agrees with the plan and the patient is also in agreement.     ____________________________________________  FINAL CLINICAL IMPRESSION(S) / ED DIAGNOSES  Final diagnoses:  Multifocal pneumonia  Chest pain, unspecified type  Shortness of breath  Pericardial effusion     MEDICATIONS GIVEN DURING THIS VISIT:  Medications  cefTRIAXone  (ROCEPHIN) 1 g in sodium chloride 0.9 % 100 mL IVPB (has no administration in time range)  azithromycin (ZITHROMAX) 500 mg in sodium chloride 0.9 % 250 mL IVPB (has no administration in time range)     ED Discharge Orders    None       Note:  This document was prepared using Dragon voice recognition software and may include unintentional dictation errors.   Loleta Rose, MD 02/18/19 2004

## 2019-02-18 NOTE — ED Triage Notes (Addendum)
  Pt to ED reporting continued body aches, SOB, productive cough with green phlegm and chest pain since Tuesday. Pt was seen for same symptoms and reports they have become worse. Intermittent headaches, fatigue and chills also reported. No fever at home that pt is aware of. PT is afebrile in tent. No known exposure and no travel.   Pt was seen in ED on Tuesday and seen by PCP twice. Pt has taken prednisone without improvement. No testing for flu or COVID

## 2019-02-19 LAB — RESPIRATORY PANEL BY PCR
Adenovirus: NOT DETECTED
Bordetella pertussis: NOT DETECTED
CORONAVIRUS OC43-RVPPCR: NOT DETECTED
Chlamydophila pneumoniae: NOT DETECTED
Coronavirus 229E: NOT DETECTED
Coronavirus HKU1: NOT DETECTED
Coronavirus NL63: NOT DETECTED
Influenza A: NOT DETECTED
Influenza B: NOT DETECTED
Metapneumovirus: NOT DETECTED
Mycoplasma pneumoniae: NOT DETECTED
Parainfluenza Virus 1: NOT DETECTED
Parainfluenza Virus 2: NOT DETECTED
Parainfluenza Virus 3: NOT DETECTED
Parainfluenza Virus 4: NOT DETECTED
Respiratory Syncytial Virus: NOT DETECTED
Rhinovirus / Enterovirus: NOT DETECTED

## 2019-02-19 MED ORDER — ACETAMINOPHEN 325 MG PO TABS: 650.0000 mg | ORAL_TABLET | Freq: Four times a day (QID) | ORAL | Status: DC | PRN

## 2019-02-19 MED ORDER — ONDANSETRON HCL 4 MG PO TABS: 4.0000 mg | ORAL_TABLET | Freq: Four times a day (QID) | ORAL | Status: DC | PRN

## 2019-02-19 MED ORDER — BUTALBITAL-APAP-CAFFEINE 50-325-40 MG PO TABS
1.0000 | ORAL_TABLET | Freq: Four times a day (QID) | ORAL | Status: DC | PRN
  Administered 2019-02-19 (×2): 1 via ORAL
  Filled 2019-02-19 (×2): qty 1

## 2019-02-19 MED ORDER — ONDANSETRON HCL 4 MG/2ML IJ SOLN: 4.0000 mg | Freq: Four times a day (QID) | INTRAMUSCULAR | Status: DC | PRN

## 2019-02-19 MED ORDER — SODIUM CHLORIDE 0.9 % IV SOLN
INTRAVENOUS | Status: DC | PRN
  Administered 2019-02-19: 250 mL via INTRAVENOUS

## 2019-02-19 MED ORDER — LORATADINE 10 MG PO TABS
10.0000 mg | ORAL_TABLET | Freq: Every day | ORAL | Status: DC
  Administered 2019-02-19 – 2019-02-20 (×2): 10 mg via ORAL
  Filled 2019-02-19 (×2): qty 1

## 2019-02-19 MED ORDER — ENOXAPARIN SODIUM 40 MG/0.4ML ~~LOC~~ SOLN
40.0000 mg | SUBCUTANEOUS | Status: DC
  Filled 2019-02-19: qty 0.4

## 2019-02-19 MED ORDER — FERROUS SULFATE 325 (65 FE) MG PO TABS
325.0000 mg | ORAL_TABLET | Freq: Every day | ORAL | Status: DC
  Administered 2019-02-19 – 2019-02-20 (×2): 325 mg via ORAL
  Filled 2019-02-19 (×2): qty 1

## 2019-02-19 MED ORDER — MONTELUKAST SODIUM 10 MG PO TABS
10.0000 mg | ORAL_TABLET | Freq: Every day | ORAL | Status: DC
  Administered 2019-02-19: 10 mg via ORAL
  Filled 2019-02-19: qty 1

## 2019-02-19 MED ORDER — AZITHROMYCIN 250 MG PO TABS
500.0000 mg | ORAL_TABLET | ORAL | Status: DC
  Administered 2019-02-19: 500 mg via ORAL
  Filled 2019-02-19: qty 2

## 2019-02-19 MED ORDER — ENOXAPARIN SODIUM 40 MG/0.4ML ~~LOC~~ SOLN: 40.0000 mg | SUBCUTANEOUS | Status: DC

## 2019-02-19 MED ORDER — ACETAMINOPHEN 500 MG PO TABS
1000.0000 mg | ORAL_TABLET | Freq: Once | ORAL | Status: AC
  Administered 2019-02-19: 1000 mg via ORAL
  Filled 2019-02-19: qty 2

## 2019-02-19 NOTE — Progress Notes (Signed)
[facility] - [place] at [facility]   PATIENT NAME: Katie Hays    MR#:  [id number]  DATE OF BIRTH:  09-25-1981  SUBJECTIVE: Admitted yesterday for some blood body aches, chest pain, shortness of breath, cough with productive phlegm, subjective fevers at home, admitted for possible rule out of   COVID 19.  CHIEF COMPLAINT:   Chief Complaint  Patient presents with  . Generalized Body Aches  . Chest Pain  . Shortness of Breath  No hypoxia, saturation 99% on room air, has some headache.  Afebrile.  Has hypotension.  REVIEW OF SYSTEMS:   ROS CONSTITUTIONAL: No fever, fatigue or weakness.  EYES: No blurred or double vision.  Complains of headache. EARS, NOSE, AND THROAT: No tinnitus or ear pain.  RESPIRATORY: No cough, shortness of breath, wheezing or hemoptysis.  CARDIOVASCULAR: No chest pain, orthopnea, edema.  GASTROINTESTINAL: No nausea, vomiting, diarrhea or abdominal pain.  GENITOURINARY: No dysuria, hematuria.  ENDOCRINE: No polyuria, nocturia,  HEMATOLOGY: No anemia, easy bruising or bleeding SKIN: No rash or lesion. MUSCULOSKELETAL: No joint pain or arthritis.   NEUROLOGIC: No tingling, numbness, weakness.  PSYCHIATRY: No anxiety or depression.   DRUG ALLERGIES:   Allergies  Allergen Reactions  . Dilaudid [Hydromorphone Hcl] Swelling    VITALS:  Blood pressure (!) 99/48, pulse (!) 59, temperature 98 F (36.7 C), temperature source Oral, resp. rate 16, height 5\' 6"  (1.676 m), weight 74.4 kg, last menstrual period 01/12/2019, SpO2 99 %.  PHYSICAL EXAMINATION:  GENERAL:  38 y.o.-year-old patient lying in the bed with no acute distress.  EYES: Pupils equal, round, reactive to light and accommodation. No scleral icterus. Extraocular muscles intact.  HEENT: Head atraumatic, normocephalic. Oropharynx and nasopharynx clear.  NECK:  Supple, no jugular venous distention. No thyroid enlargement, no tenderness.  LUNGS: Normal breath sounds  bilaterally, no wheezing, rales,rhonchi or crepitation. No use of accessory muscles of respiration.  CARDIOVASCULAR: S1, S2 normal. No murmurs, rubs, or gallops.  ABDOMEN: Soft, nontender, nondistended. Bowel sounds present. No organomegaly or mass.  EXTREMITIES: No pedal edema, cyanosis, or clubbing.  NEUROLOGIC: Cranial nerves II through XII are intact. Muscle strength 5/5 in all extremities. Sensation intact. Gait not checked.  PSYCHIATRIC: The patient is alert and oriented x 3.  SKIN: No obvious rash, lesion, or ulcer.    LABORATORY PANEL:   CBC Recent Labs  Lab 02/18/19 1759  WBC 4.0  HGB 11.5*  HCT 36.5  PLT 254   ------------------------------------------------------------------------------------------------------------------  Chemistries  Recent Labs  Lab 02/18/19 1759  NA 138  K 3.5  CL 106  CO2 24  GLUCOSE 98  BUN 8  CREATININE 0.73  CALCIUM 8.8*  AST 15  ALT 17  ALKPHOS 43  BILITOT 0.3   ------------------------------------------------------------------------------------------------------------------  Cardiac Enzymes Recent Labs  Lab 02/18/19 1759  TROPONINI <0.03   ------------------------------------------------------------------------------------------------------------------  RADIOLOGY:  Dg Chest 1 View  Result Date: 02/18/2019 CLINICAL DATA:  Body aches, shortness of breath, productive cough, fever, and chills. EXAM: CHEST  1 VIEW COMPARISON:  Chest CT 02/15/2019 FINDINGS: The cardiomediastinal silhouette is within normal limits. Mild patchy airspace opacity is present in the left greater than right lower lungs with evidence of mild retrocardiac consolidation in the left lower lobe. No pleural effusion or pneumothorax is identified. No acute osseous abnormality is seen. IMPRESSION: Left greater than right lower lung airspace opacities compatible with pneumonia. Electronically Signed   By: Sebastian Ache M.D.   On: 02/18/2019 18:05    EKG:  Orders placed or performed during the hospital encounter of 02/18/19  . ED EKG  . ED EKG    ASSESSMENT AND PLAN:   Multifocal pneumonia, on Rocephin, Zithromax, can change azithromycin from IV to p.o.  #2 pericardial effusion, no hypoxia, can have echocardiogram but due to COVD rule out no urgent indication now to do ECHO, patient is not hypoxic.  Regular chest x-ray portable for follow-up of effusion. #3. history of iron deficiency anemia, patient is on ferrous sulfate 4.  Headache, use Tylenol, Fioricet as needed.  Avoid NSAID use at this time. #5 pericardial effusion, patient had a CT of the chest done on March 24 which showed increase in the size of pericardial effusion compared to 2017 echocardiogram, patient is not hypoxic, there is no urgency to do echocardiogram while we are ruling out  COVID.19.  All the records are reviewed and case discussed with Care Management/Social Workerr. Management plans discussed with the patient, family and they are in agreement.  CODE STATUS: Full code  TOTAL TIME TAKING CARE OF THIS PATIENT: .    likely discharge home tomorrow.   Salvadore Valvano M.8 on 02/19/2019 at 10:02 AM  Between 7am to 6pm - Pager - [phone]  After 6pm go to [email or web address] - password [email or web address] [facility]  [facility]  Office  [phone]  CC: Primary care physician; Alba Cory, MD   Note: This dictation was prepared with Dragon dictation along with smaller phrase technology. Any transcriptional errors that result from this process are unintentional.

## 2019-02-19 NOTE — Progress Notes (Signed)
PHARMACIST - PHYSICIAN COMMUNICATION  CONCERNING: Antibiotic IV to Oral Route Change Policy  RECOMMENDATION: This patient is receiving azithromycin by the intravenous route.  Based on criteria approved by the Pharmacy and Therapeutics Committee, the antibiotic(s) is/are being converted to the equivalent oral dose form(s).   DESCRIPTION: These criteria include:  Patient being treated for a respiratory tract infection, urinary tract infection, cellulitis or clostridium difficile associated diarrhea if on metronidazole  The patient is not neutropenic and does not exhibit a GI malabsorption state  The patient is eating (either orally or via tube) and/or has been taking other orally administered medications for a least 24 hours  The patient is improving clinically and has a Tmax < 100.5  If you have questions about this conversion, please contact the Pharmacy Department   []   [phone] )  [facility]   Katie Hays 03.28.2022 [phone]

## 2019-02-19 NOTE — Progress Notes (Signed)
Ch spoke briefly w/ pt via telephone. Pt shared that this was her first time having pneumonia and she is being tested for other respiratory infections as well as CV-19. Pt shared that she has been able to communicate w/ spouse and other family in the area and feels like she is improving. Ch shared that she could hv a nurse page [place] during her time being hospitalized. Pt appreciated the call.    02/19/19 1800  Clinical Encounter Type  Visited With Patient  Visit Type Psychological support;Spiritual support;Social support  Spiritual Encounters  Spiritual Needs Emotional;Grief support  Stress Factors  Patient Stress Factors Health changes;Major life changes  Family Stress Factors None identified

## 2019-02-19 NOTE — ED Notes (Signed)
ED TO INPATIENT HANDOFF REPORT  ED Nurse Name and Phone #: Idalia Needle [phone]/ Lurena Joiner [phone]  S Name/Age/Gender Katie Hays 38 y.o. female Room/Bed: ED31A/ED31A  Code Status   Code Status: Full Code  Home/SNF/Other Home Patient oriented to: self, place, time and situation Is this baseline? Yes   Triage Complete: Triage complete  Chief Complaint PARK1/ Cough  Triage Note  Pt to ED reporting continued body aches, SOB, productive cough with green phlegm and chest pain since Tuesday. Pt was seen for same symptoms and reports they have become worse. Intermittent headaches, fatigue and chills also reported. No fever at home that pt is aware of. PT is afebrile in tent. No known exposure and no travel.   Pt was seen in ED on Tuesday and seen by PCP twice. Pt has taken prednisone without improvement. No testing for flu or COVID    Allergies Allergies  Allergen Reactions  . Dilaudid [Hydromorphone Hcl] Swelling    Level of Care/Admitting Diagnosis ED Disposition    ED Disposition Condition Comment   Admit  Hospital Area: [facility] [[id number]]  Level of Care: Med-Surg [16]  Diagnosis: Pneumonia [227785]  Admitting Physician: Enid Baas [[id number]]  Attending Physician: Enid Baas [[id number]]  Estimated length of stay: past midnight tomorrow  Certification:: I certify this patient will need inpatient services for at least 2 midnights  Bed request comments: COVID-19 - low risk  PT Class (Do Not Modify): Inpatient [101]  PT Acc Code (Do Not Modify): Private [1]       B Medical/Surgery History Past Medical History:  Diagnosis Date  . Anemia   . History of meningitis   . Pericardial effusion    Past Surgical History:  Procedure Laterality Date  . CESAREAN SECTION  04/30/2016   04/16/2012  . TUBAL LIGATION Bilateral 04/30/2016     A IV Location/Drains/Wounds Patient Lines/Drains/Airways Status   Active Line/Drains/Airways    Name:    Placement date:   Placement time:   Site:   Days:   Peripheral IV 02/18/19 Left Hand   02/18/19    2005    Hand   1          Intake/Output Last 24 hours No intake or output data in the 24 hours ending 02/19/19 0016  Labs/Imaging Results for orders placed or performed during the hospital encounter of 02/18/19 (from the past 48 hour(s))  CBC with Differential/Platelet     Status: Abnormal   Collection Time: 02/18/19  5:59 PM  Result Value Ref Range   WBC 4.0 4.0 - 10.5 K/uL   RBC 4.30 3.87 - 5.11 MIL/uL   Hemoglobin 11.5 (L) 12.0 - 15.0 g/dL   HCT 84.1 66.0 - 63.0 %   MCV 84.9 80.0 - 100.0 fL   MCH 26.7 26.0 - 34.0 pg   MCHC 31.5 30.0 - 36.0 g/dL   RDW 16.0 10.9 - 32.3 %   Platelets 254 150 - 400 K/uL   nRBC 0.0 0.0 - 0.2 %   Neutrophils Relative % 48 %   Neutro Abs 2.0 1.7 - 7.7 K/uL   Lymphocytes Relative 36 %   Lymphs Abs 1.4 0.7 - 4.0 K/uL   Monocytes Relative 12 %   Monocytes Absolute 0.5 0.1 - 1.0 K/uL   Eosinophils Relative 3 %   Eosinophils Absolute 0.1 0.0 - 0.5 K/uL   Basophils Relative 1 %   Basophils Absolute 0.0 0.0 - 0.1 K/uL   Immature Granulocytes 0 %  Abs Immature Granulocytes 0.01 0.00 - 0.07 K/uL    Comment: Performed at [facility], [address] [place], [place] [address]  Lactic acid, plasma     Status: None   Collection Time: 02/18/19  5:59 PM  Result Value Ref Range   Lactic Acid, Venous 0.7 0.5 - 1.9 mmol/L    Comment: Performed at [facility], [address] [place], [place] [address]  Troponin I - ONCE - STAT     Status: None   Collection Time: 02/18/19  5:59 PM  Result Value Ref Range   Troponin I <0.03 <0.03 ng/mL    Comment: Performed at [facility], [address] [place], [place] [address]  Comprehensive metabolic panel     Status: Abnormal   Collection Time: 02/18/19  5:59 PM  Result Value Ref Range   Sodium 138 135 - 145 mmol/L   Potassium 3.5 3.5 - 5.1 mmol/L   Chloride 106 98 - 111 mmol/L    CO2 24 22 - 32 mmol/L   Glucose, Bld 98 70 - 99 mg/dL   BUN 8 6 - 20 mg/dL   Creatinine, Ser 9.14 0.44 - 1.00 mg/dL   Calcium 8.8 (L) 8.9 - 10.3 mg/dL   Total Protein 6.8 6.5 - 8.1 g/dL   Albumin 3.7 3.5 - 5.0 g/dL   AST 15 15 - 41 U/L   ALT 17 0 - 44 U/L   Alkaline Phosphatase 43 38 - 126 U/L   Total Bilirubin 0.3 0.3 - 1.2 mg/dL   GFR calc non Af Amer >60 >60 mL/min   GFR calc Af Amer >60 >60 mL/min   Anion gap 8 5 - 15    Comment: Performed at [facility], [address] [place], [place] [address]  Procalcitonin     Status: None   Collection Time: 02/18/19  5:59 PM  Result Value Ref Range   Procalcitonin <0.10 ng/mL    Comment:        Interpretation: PCT (Procalcitonin) <= 0.5 ng/mL: Systemic infection (sepsis) is not likely. Local bacterial infection is possible. (NOTE)       Sepsis PCT Algorithm           Lower Respiratory Tract                                      Infection PCT Algorithm    ----------------------------     ----------------------------         PCT < 0.25 ng/mL                PCT < 0.10 ng/mL         Strongly encourage             Strongly discourage   discontinuation of antibiotics    initiation of antibiotics    ----------------------------     -----------------------------       PCT 0.25 - 0.50 ng/mL            PCT 0.10 - 0.25 ng/mL               OR       >80% decrease in PCT            Discourage initiation of  antibiotics      Encourage discontinuation           of antibiotics    ----------------------------     -----------------------------         PCT >= 0.50 ng/mL              PCT 0.26 - 0.50 ng/mL               AND        <80% decrease in PCT             Encourage initiation of                                             antibiotics       Encourage continuation           of antibiotics    ----------------------------     -----------------------------        PCT >= 0.50 ng/mL                   PCT > 0.50 ng/mL               AND         increase in PCT                  Strongly encourage                                      initiation of antibiotics    Strongly encourage escalation           of antibiotics                                     -----------------------------                                           PCT <= 0.25 ng/mL                                                 OR                                        > 80% decrease in PCT                                     Discontinue / Do not initiate                                             antibiotics Performed at [facility], [address] [place], [place] [address]   Brain natriuretic peptide     Status: None   Collection Time: 02/18/19  6:00 PM  Result Value Ref Range   B Natriuretic Peptide 12.0 0.0 - 100.0 pg/mL    Comment: Performed at [facility], [address] [place], [place] [address]  Influenza panel by PCR (type A & B)     Status: None   Collection Time: 02/18/19  9:51 PM  Result Value Ref Range   Influenza A By PCR NEGATIVE NEGATIVE   Influenza B By PCR NEGATIVE NEGATIVE    Comment: (NOTE) The Xpert Xpress Flu assay is intended as an aid in the diagnosis of  influenza and should not be used as a sole basis for treatment.  This  assay is FDA approved for nasopharyngeal swab specimens only. Nasal  washings and aspirates are unacceptable for Xpert Xpress Flu testing. Performed at [facility], [address] [place], [place] [address]    Dg Chest 1 View  Result Date: 02/18/2019 CLINICAL DATA:  Body aches, shortness of breath, productive cough, fever, and chills. EXAM: CHEST  1 VIEW COMPARISON:  Chest CT 02/15/2019 FINDINGS: The cardiomediastinal silhouette is within normal limits. Mild patchy airspace opacity is present in the left greater than right lower lungs with evidence of mild retrocardiac consolidation in the left lower lobe. No pleural effusion or  pneumothorax is identified. No acute osseous abnormality is seen. IMPRESSION: Left greater than right lower lung airspace opacities compatible with pneumonia. Electronically Signed   By: Sebastian Ache M.D.   On: 02/18/2019 18:05    Pending Labs Unresulted Labs (From admission, onward)    Start     Ordered   02/18/19 2133  Respiratory Panel by PCR  (Respiratory virus panel with precautions)  Once,   STAT     02/18/19 2132   02/18/19 2132  Novel Coronavirus, NAA (hospital order; send-out to ref lab)  (Novel Coronavirus, NAA [facility] Order; send-out to ref lab) with precautions panel)  Once,   STAT    Question Answer Comment  Current symptoms Fever and Shortness of breath   Excluded other viral illnesses Yes   Exposure Risk None      02/18/19 2132   02/18/19 1924  Blood culture (routine x 2)  BLOOD CULTURE X 2,   STAT     02/18/19 1923   Signed and Held  Novel Coronavirus, NAA (hospital order; send-out to ref lab)  ([facility] IP COVID ADMIT NOVEL CORONAVIRUS, NAA (HOSPITAL ORDER; SEND-OUT TO REF LAB))  Once,   R    Question Answer Comment  Current symptoms Fever and Cough   Excluded other viral illnesses No (testing not indicated)   Patient immune status Normal      Signed and Held          Vitals/Pain Today's Vitals   02/18/19 1915 02/18/19 2130 02/19/19 0011 02/19/19 0013  BP: 97/82 (!) 105/48  (!) 111/57  Pulse: 64 70  (!) 58  Resp: 19 (!) 22  17  Temp: 98.2 F (36.8 C) 98 F (36.7 C)    TempSrc: Oral Oral    SpO2: 100% 100%  96%  Weight:      Height:      PainSc:   9      Isolation Precautions Airborne and Contact precautions  Medications Medications  cefTRIAXone (ROCEPHIN) 1 g in sodium chloride 0.9 % 100 mL IVPB (1 g Intravenous Not Given 02/18/19 2131)  azithromycin (ZITHROMAX) 500 mg in sodium chloride 0.9 % 250 mL IVPB (500 mg Intravenous Not Given 02/18/19 2131)  enoxaparin (LOVENOX) injection 40  mg (has no administration in time range)  acetaminophen (TYLENOL)  tablet 650 mg (650 mg Oral Given 02/19/19 0011)  ondansetron (ZOFRAN) tablet 4 mg (has no administration in time range)    Or  ondansetron (ZOFRAN) injection 4 mg (has no administration in time range)  cefTRIAXone (ROCEPHIN) 1 g in sodium chloride 0.9 % 100 mL IVPB (0 g Intravenous Stopped 02/18/19 2116)  azithromycin (ZITHROMAX) 500 mg in sodium chloride 0.9 % 250 mL IVPB (0 mg Intravenous Stopped 02/18/19 2150)    Mobility walks Low fall risk   Focused Assessments Pulmonary Assessment Handoff:  Lung sounds:   O2 Device: Room Air        R Recommendations: See Admitting Provider Note  Report given to:   Additional Notes: PT given tylenol at 1211am for body aches

## 2019-02-19 NOTE — Plan of Care (Signed)
  Problem: Education: Goal: Knowledge of General Education information will improve Description Including pain rating scale, medication(s)/side effects and non-pharmacologic comfort measures Outcome: Progressing   Problem: Health Behavior/Discharge Planning: Goal: Ability to manage health-related needs will improve Outcome: Progressing   Problem: Clinical Measurements: Goal: Ability to maintain clinical measurements within normal limits will improve Outcome: Progressing Goal: Will remain free from infection Outcome: Progressing Goal: Diagnostic test results will improve Outcome: Progressing Goal: Respiratory complications will improve Outcome: Progressing Goal: Cardiovascular complication will be avoided Outcome: Progressing   Problem: Coping: Goal: Level of anxiety will decrease Outcome: Progressing   Problem: Pain Managment: Goal: General experience of comfort will improve Outcome: Progressing   Problem: Activity: Goal: Ability to tolerate increased activity will improve Outcome: Progressing   Problem: Clinical Measurements: Goal: Ability to maintain a body temperature in the normal range will improve Outcome: Progressing   Problem: Respiratory: Goal: Ability to maintain adequate ventilation will improve Outcome: Progressing Goal: Ability to maintain a clear airway will improve Outcome: Progressing

## 2019-02-20 MED ORDER — AZITHROMYCIN 250 MG PO TABS: ORAL_TABLET | ORAL | 0 refills | Status: DC

## 2019-02-20 MED ORDER — AMOXICILLIN-POT CLAVULANATE 875-125 MG PO TABS: 1.0000 | ORAL_TABLET | Freq: Two times a day (BID) | ORAL | 0 refills | Status: AC

## 2019-02-20 NOTE — Plan of Care (Signed)
Up in room without distress.

## 2019-02-20 NOTE — Progress Notes (Signed)
Patient feeling well. Independent in the room, took a shower.  No SOB nor dyspnea.  Afebrile. Discharged to home with husband and children.  Covid 19 instructions explained to her and given to her in written form.  Prescriptions e-filed to [facility] in [place].  Instructed her to call her PCP with any symptom changes and when she feels she is well.

## 2019-02-20 NOTE — Discharge Summary (Signed)
Katie Hays, is a 38 y.o. female  DOB [birth year]  MRN [id number].  Admission date:  02/18/2019  Admitting Physician  Enid Baas, MD  Discharge Date:  02/20/2019   Primary MD  Alba Cory, MD  Recommendations for primary care physician for things to follow:   Follow with PCP in 1 week  Admission Diagnosis  Shortness of breath [R06.02] Pericardial effusion [I31.3] Multifocal pneumonia [J18.9] Chest pain, unspecified type [R07.9] Pneumonia [J18.9]   Discharge Diagnosis  Shortness of breath [R06.02] Pericardial effusion [I31.3] Multifocal pneumonia [J18.9] Chest pain, unspecified type [R07.9] Pneumonia [J18.9]    Active Problems:   Pneumonia      Past Medical History:  Diagnosis Date  . Anemia   . History of meningitis   . Pericardial effusion     Past Surgical History:  Procedure Laterality Date  . CESAREAN SECTION  04/30/2016   04/16/2012  . TUBAL LIGATION Bilateral 04/30/2016       History of present illness and  Hospital Course:     Kindly see H&P for history of present illness and admission details, please review complete Labs, Consult reports and Test reports for all details in brief  HPI  from the history and physical done on the day of admission 38 year old female patient came in because of generalized body aches, shortness of breath, chest pain, seen by PCP and started on outpatient prednisone but she did not improve so she came to hospital.  Patient had subjective fevers with cough and phlegm at home did not have any sick contacts and no recent travel history.  Patient admitted for possible COVID-19 rule out.   Hospital Course   #1. community-acquired pneumonia, chest x-ray on admission showed bilateral infiltrates left greater than right, patient is admitted to isolation room  to rule out COVID-19, started on IV Rocephin, Zithromax to cover for pneumonia.  Patient flu test has been negative, RSV panel also has been negative, COVID-19 test has been sent but results are pending, patient has been afebrile, feeling much better, hemodynamically stable, hypoxia,, afebrile, stable for discharge today, patient can take Augmentin for 7 days, azithromycin 500 mg for 3 days, patient should be home quarantine as person under investigation, follow hospital protocol for home isolation guide that need to be signed by the patient and scanned to the epic. #2 headache, improved with Fioricet in the hospital.  Patient should avoid NSAID's till she gets the results of COVID-19.  Can take only Tylenol if needed for headache. 3.  Pericardial effusion, p slightly increased from CT chest that was done 2 years ago, patient is not hypoxic and did not get echocardiogram for possible rule out of COVID-19 and also history is not unstable.  Patient is not hypoxic also.  Can follow with PCP.Marland Kitchen  Discharge Condition:   Follow UP  Follow-up Information    Alba Cory, MD. Schedule an appointment as soon as possible for a visit in 1 week(s).   Specialty:  Family Medicine Contact information: [address] [place] [address] [phone]             Discharge Instructions  and  Discharge Medications   Patient is to maintain in home isolation until she gets COVID-19 results.  Allergies as of 02/20/2019      Reactions   Dilaudid [hydromorphone Hcl] Swelling      Medication List    STOP taking these medications   meloxicam 7.5 MG tablet Commonly known as:  Mobic  TAKE these medications   acetaminophen-codeine 300-30 MG tablet Commonly known as:  TYLENOL #3 Take 1 tablet by mouth every 4 (four) hours as needed for moderate pain.   amoxicillin-clavulanate 875-125 MG tablet Commonly known as:  Augmentin Take 1 tablet by mouth 2 (two) times daily for 14 days.    azithromycin 250 MG tablet Commonly known as:  ZITHROMAX Daily for 4 days   cetirizine 10 MG tablet Commonly known as:  ZYRTEC Take 10 mg by mouth daily.   ferrous sulfate 325 (65 FE) MG tablet Take 1 tablet (325 mg total) by mouth daily with breakfast.   montelukast 10 MG tablet Commonly known as:  SINGULAIR Take 1 tablet (10 mg total) by mouth at bedtime.   MULTIVITAMIN GUMMIES WOMENS PO Take 2 each by mouth daily. [employer]         Diet and Activity recommendation: See Discharge Instructions above   Consults obtained -none   Major procedures and Radiology Reports - PLEASE review detailed and final reports for all details, in brief -      Dg Chest 1 View  Result Date: 02/18/2019 CLINICAL DATA:  Body aches, shortness of breath, productive cough, fever, and chills. EXAM: CHEST  1 VIEW COMPARISON:  Chest CT 02/15/2019 FINDINGS: The cardiomediastinal silhouette is within normal limits. Mild patchy airspace opacity is present in the left greater than right lower lungs with evidence of mild retrocardiac consolidation in the left lower lobe. No pleural effusion or pneumothorax is identified. No acute osseous abnormality is seen. IMPRESSION: Left greater than right lower lung airspace opacities compatible with pneumonia. Electronically Signed   By: Sebastian Ache M.D.   On: 02/18/2019 18:05   Ct Angio Chest Pe W And/or Wo Contrast  Result Date: 02/15/2019 CLINICAL DATA:  Chest pain for 2 weeks. Dyspnea. History of mitral valve prolapse. EXAM: CT ANGIOGRAPHY CHEST WITH CONTRAST TECHNIQUE: Multidetector CT imaging of the chest was performed using the standard protocol during bolus administration of intravenous contrast. Multiplanar CT image reconstructions and MIPs were obtained to evaluate the vascular anatomy. CONTRAST:  75mL OMNIPAQUE IOHEXOL 350 MG/ML SOLN COMPARISON:  CT AP 04/03/2016 FINDINGS: Cardiovascular: There is cardiomegaly with pericardial effusion measuring up  to 2.3 cm in thickness posteriorly and 0.9 cm in thickness anteriorly and along the right lateral aspect. No pulmonary embolus, aortic aneurysm or dissection. Dilatation of the pulmonary arterial trunk to 3.1 cm likely reflecting a component of chronic pulmonary hypertension. Mediastinum/Nodes: No enlarged mediastinal, hilar, or axillary lymph nodes. Thyroid gland, trachea, and esophagus demonstrate no significant findings. Lungs/Pleura: Trace right pleural effusion. Mild peribronchial thickening to the left lower lobe with minimal subsegmental atelectasis. No pulmonary consolidation or mass. Upper Abdomen: No acute abnormality. Musculoskeletal: No chest wall abnormality. No acute or significant osseous findings. Review of the MIP images confirms the above findings. IMPRESSION: 1. No acute pulmonary consolidation. 2. No acute pulmonary embolus. 3. Stable cardiomegaly with interval increase in pericardial effusion measuring up to 2.3 cm in thickness posteriorly, previously 1.3 cm on limited views of the included heart from prior CT abdomen and pelvis dated 04/03/2016. 4. Dilatation of the pulmonary arterial trunk suggesting chronic pulmonary hypertension. Electronically Signed   By: Tollie Eth M.D.   On: 02/15/2019 19:56    Micro Results    Recent Results (from the past 240 hour(s))  Blood culture (routine x 2)     Status: None (Preliminary result)   Collection Time: 02/18/19  7:49 PM  Result Value Ref Range Status  Specimen Description BLOOD BLOOD LEFT HAND  Final   Special Requests   Final    BOTTLES DRAWN AEROBIC AND ANAEROBIC Blood Culture adequate volume   Culture   Final    NO GROWTH 2 DAYS Performed at [facility], [address] [place], [place] [address]    Report Status PENDING  Incomplete  Blood culture (routine x 2)     Status: None (Preliminary result)   Collection Time: 02/18/19  7:49 PM  Result Value Ref Range Status   Specimen Description BLOOD LEFT ANTECUBITAL   Final   Special Requests   Final    BOTTLES DRAWN AEROBIC AND ANAEROBIC Blood Culture adequate volume   Culture   Final    NO GROWTH 2 DAYS Performed at [facility], [address], [place], [place] [address]    Report Status PENDING  Incomplete  Respiratory Panel by PCR     Status: None   Collection Time: 02/18/19  9:51 PM  Result Value Ref Range Status   Adenovirus NOT DETECTED NOT DETECTED Final   Coronavirus 229E NOT DETECTED NOT DETECTED Final    Comment: (NOTE) The Coronavirus on the Respiratory Panel, DOES NOT test for the novel  Coronavirus (2019 nCoV)    Coronavirus HKU1 NOT DETECTED NOT DETECTED Final   Coronavirus NL63 NOT DETECTED NOT DETECTED Final   Coronavirus OC43 NOT DETECTED NOT DETECTED Final   Metapneumovirus NOT DETECTED NOT DETECTED Final   Rhinovirus / Enterovirus NOT DETECTED NOT DETECTED Final   Influenza A NOT DETECTED NOT DETECTED Final   Influenza B NOT DETECTED NOT DETECTED Final   Parainfluenza Virus 1 NOT DETECTED NOT DETECTED Final   Parainfluenza Virus 2 NOT DETECTED NOT DETECTED Final   Parainfluenza Virus 3 NOT DETECTED NOT DETECTED Final   Parainfluenza Virus 4 NOT DETECTED NOT DETECTED Final   Respiratory Syncytial Virus NOT DETECTED NOT DETECTED Final   Bordetella pertussis NOT DETECTED NOT DETECTED Final   Chlamydophila pneumoniae NOT DETECTED NOT DETECTED Final   Mycoplasma pneumoniae NOT DETECTED NOT DETECTED Final    Comment: Performed at [facility], [address] [place], [place] [address]       Today   Subjective:   Katie Hays today has no headache,no chest abdominal pain,no new weakness tingling or numbness, feels much better wants to go home today.  Objective:   Blood pressure (!) 95/49, pulse 63, temperature 97.8 F (36.6 C), temperature source Oral, resp. rate 16, height 5\' 6"  (1.676 m), weight 74.4 kg, last menstrual period 01/12/2019, SpO2 100 %.   Intake/Output Summary (Last 24 hours) at  02/20/2019 1033 Last data filed at 02/20/2019 0748 Gross per 24 hour  Intake 108 ml  Output 600 ml  Net -492 ml    Exam Awake Alert, Oriented x 3, No new F.N deficits, Normal affect [place].AT,PERRAL Supple Neck,No JVD, No cervical lymphadenopathy appriciated.  Symmetrical Chest wall movement, Good air movement bilaterally, CTAB RRR,No Gallops,Rubs or new Murmurs, No Parasternal Heave +ve B.Sounds, Abd Soft, Non tender, No organomegaly appriciated, No rebound -guarding or rigidity. No Cyanosis, Clubbing or edema, No new Rash or bruise  Data Review   CBC w Diff:  Lab Results  Component Value Date   WBC 4.0 02/18/2019   HGB 11.5 (L) 02/18/2019   HGB 11.3 (L) 02/17/2013   HCT 36.5 02/18/2019   HCT 34.9 (L) 02/17/2013   PLT 254 02/18/2019   PLT 342 02/17/2013   LYMPHOPCT 36 02/18/2019   LYMPHOPCT  42.9 05/13/2012   MONOPCT 12 02/18/2019   MONOPCT 9.0 05/13/2012   EOSPCT 3 02/18/2019   EOSPCT 14.7 05/13/2012   BASOPCT 1 02/18/2019   BASOPCT 0.9 05/13/2012    CMP:  Lab Results  Component Value Date   NA 138 02/18/2019   NA 140 02/17/2013   K 3.5 02/18/2019   K 4.1 02/17/2013   CL 106 02/18/2019   CL 110 (H) 02/17/2013   CO2 24 02/18/2019   CO2 26 02/17/2013   BUN 8 02/18/2019   BUN 7 02/17/2013   CREATININE 0.73 02/18/2019   CREATININE 0.87 12/03/2018   PROT 6.8 02/18/2019   PROT 7.4 02/17/2013   ALBUMIN 3.7 02/18/2019   ALBUMIN 3.6 02/17/2013   BILITOT 0.3 02/18/2019   BILITOT 0.3 02/17/2013   ALKPHOS 43 02/18/2019   ALKPHOS 94 02/17/2013   AST 15 02/18/2019   AST 20 02/17/2013   ALT 17 02/18/2019   ALT 14 02/17/2013  .   Total Time in preparing paper work, data evaluation and todays exam - 35 minutes  Katha Hamming M.D on 02/20/2019 at 10:33 AM    Note: This dictation was prepared with Dragon dictation along with smaller phrase technology. Any transcriptional errors that result from this process are unintentional.

## 2019-02-20 NOTE — Consult Note (Signed)
PHARMACY - PHYSICIAN COMMUNICATION CRITICAL VALUE ALERT - BLOOD CULTURE IDENTIFICATION (BCID)  Katie Hays is an 38 y.o. female who presented to [facility] on 02/18/2019 with a chief complaint of SOB  Assessment:  Aerobic Gram + Bacilli in 1/4 Bottles (include suspected source if known)  Name of physician (or Provider) Contacted: Dr Renae Gloss  Current antibiotics: Discharged on Augmentin and Azithromycin  Changes to prescribed antibiotics recommended:  None at this time  No results found for this or any previous visit.  Albina Billet, PharmD, BCPS Clinical Pharmacist 02/20/2019 8:10 PM

## 2019-02-20 NOTE — Discharge Instructions (Signed)
Covid 19  test results are pending, patient should quarantine at home with droplet mask on until she is contacted by our hospital/health department regarding the results.  Patient will get discharge paperwork by telemetry staff.

## 2019-02-21 ENCOUNTER — Telehealth: Payer: Self-pay

## 2019-02-21 NOTE — Telephone Encounter (Signed)
Transition Care Management Follow-up Telephone Call  Date of discharge and from where: 02/20/19 [facility]  How have you been since you were released from the hospital? Pt states she is feeling a little better but still fatigued  Any questions or concerns? Yes  please advise if you would like to see patient in the office for hospital follow up. She has been advised to self quarantine for 14 days and awaiting test results for COVID-19.  Items Reviewed:  Did the pt receive and understand the discharge instructions provided? Yes   Medications obtained and verified? Yes   Any new allergies since your discharge? No   Dietary orders reviewed? Yes  Do you have support at home? Yes   Functional Questionnaire: (I = Independent and D = Dependent) ADLs: I  Bathing/Dressing- I  Meal Prep- I  Eating- I  Maintaining continence- I  Transferring/Ambulation- I  Managing Meds- I  Follow up appointments reviewed:   PCP Hospital f/u appt confirmed? No  awaiting instructions from PCP.  Are transportation arrangements needed? No   If their condition worsens, is the pt aware to call PCP or go to the Emergency Dept.? Yes  Was the patient provided with contact information for the PCP's office or ED? Yes  Was to pt encouraged to call back with questions or concerns? Yes

## 2019-02-22 LAB — NOVEL CORONAVIRUS, NAA (HOSP ORDER, SEND-OUT TO REF LAB; TAT 18-24 HRS): SARS-CoV-2, NAA: NOT DETECTED

## 2019-02-22 LAB — CULTURE, BLOOD (ROUTINE X 2): Special Requests: ADEQUATE

## 2019-02-22 LAB — NOVEL CORONAVIRUS, NAA (HOSPITAL ORDER, SEND-OUT TO REF LAB)

## 2019-02-23 LAB — CULTURE, BLOOD (ROUTINE X 2)
Culture: NO GROWTH
Special Requests: ADEQUATE

## 2019-02-24 ENCOUNTER — Telehealth: Payer: Self-pay | Admitting: Emergency Medicine

## 2019-02-24 NOTE — Telephone Encounter (Signed)
Called to inform patient of covid 19 result.  Left message with my number.

## 2019-02-28 NOTE — Telephone Encounter (Signed)
Patient called me back and I gave her covid results.

## 2019-04-05 ENCOUNTER — Ambulatory Visit: Payer: BLUE CROSS/BLUE SHIELD | Admitting: Podiatry

## 2019-05-17 ENCOUNTER — Ambulatory Visit: Payer: BLUE CROSS/BLUE SHIELD | Admitting: Podiatry

## 2019-12-06 ENCOUNTER — Other Ambulatory Visit: Payer: Self-pay

## 2019-12-06 ENCOUNTER — Other Ambulatory Visit (HOSPITAL_COMMUNITY): Admission: RE | Admit: 2019-12-06 | Payer: BLUE CROSS/BLUE SHIELD | Source: Ambulatory Visit

## 2019-12-06 ENCOUNTER — Ambulatory Visit (INDEPENDENT_AMBULATORY_CARE_PROVIDER_SITE_OTHER): Payer: BC Managed Care – PPO | Admitting: Family Medicine

## 2019-12-06 ENCOUNTER — Encounter: Payer: Self-pay | Admitting: Family Medicine

## 2019-12-06 VITALS — BP 98/64 | HR 94 | Temp 97.1°F | Resp 16 | Ht 66.0 in | Wt 171.6 lb

## 2019-12-06 DIAGNOSIS — N841 Polyp of cervix uteri: Secondary | ICD-10-CM | POA: Diagnosis not present

## 2019-12-06 DIAGNOSIS — R739 Hyperglycemia, unspecified: Secondary | ICD-10-CM

## 2019-12-06 DIAGNOSIS — Z124 Encounter for screening for malignant neoplasm of cervix: Secondary | ICD-10-CM | POA: Diagnosis not present

## 2019-12-06 DIAGNOSIS — Z0000 Encounter for general adult medical examination without abnormal findings: Secondary | ICD-10-CM | POA: Diagnosis not present

## 2019-12-06 DIAGNOSIS — Z1159 Encounter for screening for other viral diseases: Secondary | ICD-10-CM

## 2019-12-06 DIAGNOSIS — Z1231 Encounter for screening mammogram for malignant neoplasm of breast: Secondary | ICD-10-CM

## 2019-12-06 DIAGNOSIS — R7989 Other specified abnormal findings of blood chemistry: Secondary | ICD-10-CM | POA: Diagnosis not present

## 2019-12-06 DIAGNOSIS — D500 Iron deficiency anemia secondary to blood loss (chronic): Secondary | ICD-10-CM | POA: Diagnosis not present

## 2019-12-06 DIAGNOSIS — Z23 Encounter for immunization: Secondary | ICD-10-CM

## 2019-12-06 NOTE — Progress Notes (Addendum)
Name: Katie Hays   MRN: [id number]    DOB: September 05, 1981   Date:12/06/2019       Progress Note  Subjective  Chief Complaint  Chief Complaint  Patient presents with  . Follow-up  . Acute low back pain with radiculitis    thinks she pulled her muscle again yesterday  . Allergic Rhinitis      nasal congestion, headache, rhinorrhea, clearing her throat,headaches. Taking zyrtec not helping  . 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 She states she does not want any medications at this time.   Iron deficiency anemia: diagnosed last year, she took iron supplementation but stopped for months now, still has heavy cycles. No pica  Hyperglycemia: denies polyphagia, polydipsia or polyuria, we will recheck labs   Vitamin D deficiency: advised to resume vitamin D supplementation and stay on it  AR: she is has nasal congestion, rhinorrhea, post-nasal drainage and mild headache , taking zyrtec without help. Discussed covid testing. She states that is her typical symptoms. She has seen ENT. She tried nasal spray, she is also on singulair. She also saw allergist but all allergy testing was negative. She was supposed to have another procedure but cancelled because of COVID-19. Advised to check back with ENT  Episodic tension type headache: intermittent, takes otc medication prn   Low back pain: she states she has left upper back pain it happened this past weekend and is feeling better now. She states is having left hip pain, she states triggered by sitting down and when she stands up it causes left anterior hip pain, she will contact her chiropractor   Diet: she states not doing as well since COVID-19, she states she was eating out more often, but she is getting back to her  routine Exercise: she will try to increase to 150 minutes per week   USPSTF grade A and B recommendations    Office Visit from 02/08/2019 in [facility]  AUDIT-C Score  0     Depression: Phq 9 is  positive Depression screen [facility] 2/9 12/06/2019 02/08/2019 12/03/2018 12/02/2017 02/11/2017  Decreased Interest 1 0 0 0 0  Down, Depressed, Hopeless 0 0 0 0 0  PHQ - 2 Score 1 0 0 0 0  Altered sleeping '1 2 1 '$ - -  Tired, decreased energy '1 1 3 '$ - -  Change in appetite 1 0 1 - -  Feeling bad or failure about yourself  0 0 0 - -  Trouble concentrating 0 0 0 - -  Moving slowly or fidgety/restless 0 0 0 - -  Suicidal thoughts 0 0 0 - -  PHQ-9 Score '4 3 5 '$ - -  Difficult doing work/chores Not difficult at all Not difficult at all Not difficult at all - -   Hypertension: BP Readings from Last 3 Encounters:  12/06/19 98/64  02/20/19 (!) 95/49  02/15/19 (!) 120/56   Obesity: Wt Readings from Last 3 Encounters:  12/06/19 171 lb 9.6 oz (77.8 kg)  02/19/19 164 lb 0.4 oz (74.4 kg)  02/15/19 164 lb 12.2 oz (74.7 kg)   BMI Readings from Last 3 Encounters:  12/06/19 27.70 kg/m  02/19/19 26.47 kg/m  02/15/19 26.59 kg/m     Hep C Screening: today  STD testing and prevention (HIV/chl/gon/syphilis): not interested  Intimate partner violence: negative screen  Sexual History (Partners/Practices/Protection from [employer] hx STI/Pregnancy Plans):married, had tubal ligation, no history of STI Pain during Intercourse:no pain  Menstrual History/LMP/Abnormal Bleeding: cycles have been regular but heavy over the past year , history of anemia prior to having children  Incontinence Symptoms: no symptoms   Breast cancer:  - Last Mammogram: she will call to schedule it  - BRCA gene screening: N/A  Osteoporosis: Discussed high calcium and vitamin D supplementation, weight bearing exercises  Cervical cancer screening: today   Skin cancer: Discussed monitoring for atypical lesions   Colorectal cancer: start at 41 yo Lung cancer:   Low Dose CT Chest recommended if Age 51-80 years, 30 pack-year currently smoking OR have quit w/in 15years. Patient does not qualify.   ECG: 2020   Advanced Care Planning: A voluntary discussion about advance care planning including the explanation and discussion of advance directives.  Discussed health care proxy and Living will, and the patient was able to identify a health care proxy as husband .  Patient does have a living will at present time.  Lipids: Lab Results  Component Value Date   CHOL 162 12/03/2018   CHOL 172 12/02/2017   Lab Results  Component Value Date   HDL 59 12/03/2018   HDL 57 12/02/2017   Lab Results  Component Value Date   LDLCALC 90 12/03/2018   LDLCALC 102 (H) 12/02/2017   Lab Results  Component Value Date   TRIG 43 12/03/2018   TRIG 45 12/02/2017   Lab Results  Component Value Date   CHOLHDL 2.7 12/03/2018   CHOLHDL 3.0 12/02/2017   No results found for: LDLDIRECT  Glucose: Glucose  Date Value Ref Range Status  02/17/2013 88 65 - 99 mg/dL Final  05/13/2012 82 65 - 99 mg/dL Final   Glucose, Bld  Date Value Ref Range Status  02/18/2019 98 70 - 99 mg/dL Final  02/15/2019 67 (L) 70 - 99 mg/dL Final  12/03/2018 89 65 - 99 mg/dL Final    Comment:    .            Fasting reference interval .     Patient Active Problem List   Diagnosis Date Noted  . Pneumonia 02/18/2019  . Vitamin D deficiency 01/03/2019  . Family history of heart disease in female family member before age 95 07/04/2016  . MVP (mitral valve prolapse) 10/28/2015  . Migraine headache 10/25/2015  . H/O cesarean section 12/02/2012  . History of blood transfusion 12/02/2012    Past Surgical History:  Procedure Laterality Date  . CESAREAN SECTION  04/30/2016   04/16/2012  . TUBAL LIGATION Bilateral 04/30/2016    Family History  Problem Relation Age of Onset  . Arthritis Mother   . Heart disease Father   . Diabetes  Father   . Hypertension Father   . Asthma Sister   . Hypertension Sister   . Diabetes Sister   . Asthma Brother   . Heart attack Brother   . Asthma Sister   . Hypertension Sister   . Heart attack Sister     Social History   Socioeconomic History  . Marital status: Married    Spouse name: Moses  . Number of children: 2  . Years of education: Not on file  . Highest education level: Bachelor's degree (e.g., BA, AB, BS)  Occupational History  . Occupation: real state appraisal  Tobacco Use  . Smoking  status: Never Smoker  . Smokeless tobacco: Never Used  Substance and Sexual Activity  . Alcohol use: Never    Comment: never  . Drug use: No  . Sexual activity: Yes    Partners: Male    Birth control/protection: Other-see comments    Comment: Tubal Ligation  Other Topics Concern  . 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 Difficulty of Paying Living Expenses: Not hard at all  Food Insecurity: No Food Insecurity  . Worried About Charity fundraiser in the Last Year: Never true  . Ran Out of Food in the Last Year: Never true  Transportation Needs: No Transportation Needs  . Lack of Transportation (Medical): No  . Lack of Transportation (Non-Medical): No  Physical Activity: Insufficiently Active  . Days of Exercise per Week: 2 days  . Minutes of Exercise per Session: 50 min  Stress: No Stress Concern Present  . Feeling of Stress : Not at all  Social Connections: Not Isolated  . Frequency of Communication with Friends and Family: More than three times a week  . Frequency of Social Gatherings with Friends and Family: More than three times a week  . Attends Religious Services: More than 4 times per year  . Active Member of Clubs or Organizations: Yes  . Attends Archivist Meetings: More than 4  times per year  . Marital Status: Married  Human resources officer Violence: Not At Risk  . Fear of Current or Ex-Partner: No  . Emotionally Abused: No  . Physically Abused: No  . Sexually Abused: No     Current Outpatient Medications:  .  cetirizine (ZYRTEC) 10 MG tablet, Take 10 mg by mouth daily., Disp: , Rfl:  .  Multiple Vitamins-Minerals (MULTIVITAMIN GUMMIES WOMENS PO), Take 2 each by mouth daily. VitaFusion Organic, Disp: , Rfl:  .  acetaminophen-codeine (TYLENOL #3) 300-30 MG tablet, Take 1 tablet by mouth every 4 (four) hours as needed for moderate pain. (Patient not taking: Reported on 02/21/2019), Disp: 15 tablet, Rfl: 0 .  azithromycin (ZITHROMAX) 250 MG tablet, Daily for 4 days (Patient not taking: Reported on 12/06/2019), Disp: 4 each, Rfl: 0 .  ferrous sulfate 325 (65 FE) MG tablet, Take 1 tablet (325 mg total) by mouth daily with breakfast. (Patient not taking: Reported on 12/06/2019), Disp: 90 tablet, Rfl: 0 .  montelukast (SINGULAIR) 10 MG tablet, Take 1 tablet (10 mg total) by mouth at bedtime. (Patient not taking: Reported on 12/06/2019), Disp: 30 tablet, Rfl: 3  Allergies  Allergen Reactions  . Dilaudid [Hydromorphone Hcl] Swelling     ROS  Constitutional: Negative for fever or weight change.  Respiratory: Negative for cough and shortness of breath.   Cardiovascular: Negative for chest pain or palpitations.  Gastrointestinal: Negative for abdominal pain, no bowel changes.  Musculoskeletal: Negative for gait problem or joint swelling.  Skin: Negative for rash.  Neurological: Negative for dizziness or headache.  No other specific complaints in a complete review of systems (except as listed in HPI above).  Objective  Vitals:   12/06/19 0856  BP: 98/64  Pulse: 94  Resp: 16  Temp: (!) 97.1 F (36.2 C)  TempSrc: Temporal  SpO2: 98%  Weight: 171 lb 9.6 oz (77.8 kg)  Height: '5\' 6"'$  (1.676 m)  Body mass index is 27.7 kg/m.  Physical Exam  Constitutional:  Patient appears well-developed and well-nourished. No distress.  HENT: Head: Normocephalic and atraumatic. Ears: B TMs ok, no erythema or effusion; Nose: Nose normal. Mouth/Throat: not done  Eyes: Conjunctivae and EOM are normal. Pupils are equal, round, and reactive to light. No scleral icterus.  Neck: Normal range of motion. Neck supple. No JVD present. No thyromegaly present.  Cardiovascular: Normal rate, regular rhythm and normal heart sounds.  No murmur heard. No BLE edema. Pulmonary/Chest: Effort normal and breath sounds normal. No respiratory distress. Abdominal: Soft. Bowel sounds are normal, no distension. There is no tenderness. no masses Breast: no lumps or masses, no nipple discharge or rashes FEMALE GENITALIA:  External genitalia normal External urethra normal Vaginal vault normal without discharge or lesions Cervix : large polyps coming from cervix, patient gave verbal consent, pap collected and also cervical poly removed  Bimanual exam normal without masses RECTAL: not done Musculoskeletal: Normal range of motion, no joint effusions. No gross deformities Neurological: he is alert and oriented to person, place, and time. No cranial nerve deficit. Coordination, balance, strength, speech and gait are normal.  Skin: Skin is warm and dry. No rash noted. No erythema.  Psychiatric: Patient has a normal mood and affect. behavior is normal. Judgment and thought content normal.  Fall Risk: Fall Risk  12/06/2019 02/08/2019 12/03/2018 12/02/2017 11/10/2017  Falls in the past year? 0 0 0 No No  Number falls in past yr: 0 0 0 - -  Injury with Fall? 0 0 0 - -    Functional Status Survey: Is the patient deaf or have difficulty hearing?: No Does the patient have difficulty seeing, even when wearing glasses/contacts?: No Does the patient have difficulty concentrating, remembering, or making decisions?: No Does the patient have difficulty walking or climbing stairs?: No Does the patient have  difficulty dressing or bathing?: No Does the patient have difficulty doing errands alone such as visiting a doctor's office or shopping?: No   Assessment & Plan  1. Well adult exam  - CBC - Lipid panel - Hemoglobin A1c - COMPLETE METABOLIC PANEL WITH GFR  2. Breast cancer screening by mammogram  - MM 3D SCREEN BREAST BILATERAL; Future  3. Cervical cancer screening  - Cytology - PAP  4. Needs flu shot  - Flu Vaccine QUAD 6+ mos PF IM (Fluarix Quad PF)  5. Iron deficiency anemia due to chronic blood loss  - Iron, TIBC and Ferritin Panel  6. Hyperglycemia  A1C  7. Low vitamin D level  - VITAMIN D 25 Hydroxy (Vit-D Deficiency, Fractures)  8. Need for hepatitis C screening test  - Hepatitis C antibody  9. Cervical polyp  Verbal consent given Used a ring forceps to remove cervical polyp Used betadine to clean the area.  Patient tolerated procedure well Discussed possibility of bleeding and spotting and to contact us if severe May take ibuprofen prn for pain /cramping   Sent to pathology   -USPSTF grade A and B recommendations reviewed with patient; age-appropriate recommendations, preventive care, screening tests, etc discussed and encouraged; healthy living encouraged; see AVS for patient education given to patient -Discussed importance of 150 minutes of physical activity weekly, eat two servings of fish weekly, eat one serving of tree nuts ( cashews, pistachios, pecans, almonds.Marland Kitchen) every other day, eat 6 servings of fruit/vegetables daily and drink plenty of water and avoid sweet beverages.

## 2019-12-06 NOTE — Patient Instructions (Signed)
Preventive Care 21-41 Years Old, Female Preventive care refers to visits with your health care provider and lifestyle choices that can promote health and wellness. This includes:  A yearly physical exam. This may also be called an annual well check.  Regular dental visits and eye exams.  Immunizations.  Screening for certain conditions.  Healthy lifestyle choices, such as eating a healthy diet, getting regular exercise, not using drugs or products that contain nicotine and tobacco, and limiting alcohol use. What can I expect for my preventive care visit? Physical exam Your health care provider will check your:  Height and weight. This may be used to calculate body mass index (BMI), which tells if you are at a healthy weight.  Heart rate and blood pressure.  Skin for abnormal spots. Counseling Your health care provider may ask you questions about your:  Alcohol, tobacco, and drug use.  Emotional well-being.  Home and relationship well-being.  Sexual activity.  Eating habits.  Work and work environment.  Method of birth control.  Menstrual cycle.  Pregnancy history. What immunizations do I need?  Influenza (flu) vaccine  This is recommended every year. Tetanus, diphtheria, and pertussis (Tdap) vaccine  You may need a Td booster every 10 years. Varicella (chickenpox) vaccine  You may need this if you have not been vaccinated. Human papillomavirus (HPV) vaccine  If recommended by your health care provider, you may need three doses over 6 months. Measles, mumps, and rubella (MMR) vaccine  You may need at least one dose of MMR. You may also need a second dose. Meningococcal conjugate (MenACWY) vaccine  One dose is recommended if you are age 19-21 years and a first-year college student living in a residence hall, or if you have one of several medical conditions. You may also need additional booster doses. Pneumococcal conjugate (PCV13) vaccine  You may need  this if you have certain conditions and were not previously vaccinated. Pneumococcal polysaccharide (PPSV23) vaccine  You may need one or two doses if you smoke cigarettes or if you have certain conditions. Hepatitis A vaccine  You may need this if you have certain conditions or if you travel or work in places where you may be exposed to hepatitis A. Hepatitis B vaccine  You may need this if you have certain conditions or if you travel or work in places where you may be exposed to hepatitis B. Haemophilus influenzae type b (Hib) vaccine  You may need this if you have certain conditions. You may receive vaccines as individual doses or as more than one vaccine together in one shot (combination vaccines). Talk with your health care provider about the risks and benefits of combination vaccines. What tests do I need?  Blood tests  Lipid and cholesterol levels. These may be checked every 5 years starting at age 20.  Hepatitis C test.  Hepatitis B test. Screening  Diabetes screening. This is done by checking your blood sugar (glucose) after you have not eaten for a while (fasting).  Sexually transmitted disease (STD) testing.  BRCA-related cancer screening. This may be done if you have a family history of breast, ovarian, tubal, or peritoneal cancers.  Pelvic exam and Pap test. This may be done every 3 years starting at age 21. Starting at age 30, this may be done every 5 years if you have a Pap test in combination with an HPV test. Talk with your health care provider about your test results, treatment options, and if necessary, the need for more tests.   Follow these instructions at home: Eating and drinking   Eat a diet that includes fresh fruits and vegetables, whole grains, lean protein, and low-fat dairy.  Take vitamin and mineral supplements as recommended by your health care provider.  Do not drink alcohol if: ? Your health care provider tells you not to drink. ? You are  pregnant, may be pregnant, or are planning to become pregnant.  If you drink alcohol: ? Limit how much you have to 0-1 drink a day. ? Be aware of how much alcohol is in your drink. In the [place] one drink equals one 12 oz bottle of beer (355 mL), one 5 oz glass of wine (148 mL), or one 1 oz glass of hard liquor (44 mL). Lifestyle  Take daily care of your teeth and gums.  Stay active. Exercise for at least 30 minutes on 5 or more days each week.  Do not use any products that contain nicotine or tobacco, such as cigarettes, e-cigarettes, and chewing tobacco. If you need help quitting, ask your health care provider.  If you are sexually active, practice safe sex. Use a condom or other form of birth control (contraception) in order to prevent pregnancy and STIs (sexually transmitted infections). If you plan to become pregnant, see your health care provider for a preconception visit. What's next?  Visit your health care provider once a year for a well check visit.  Ask your health care provider how often you should have your eyes and teeth checked.  Stay up to date on all vaccines. This information is not intended to replace advice given to you by your health care provider. Make sure you discuss any questions you have with your health care provider. Document Revised: 07/22/2018 Document Reviewed: 07/22/2018 Elsevier Patient Education  2020 [employer].

## 2019-12-07 DIAGNOSIS — Z1159 Encounter for screening for other viral diseases: Secondary | ICD-10-CM | POA: Diagnosis not present

## 2019-12-07 DIAGNOSIS — Z0000 Encounter for general adult medical examination without abnormal findings: Secondary | ICD-10-CM | POA: Diagnosis not present

## 2019-12-07 DIAGNOSIS — R7989 Other specified abnormal findings of blood chemistry: Secondary | ICD-10-CM | POA: Diagnosis not present

## 2019-12-07 DIAGNOSIS — D500 Iron deficiency anemia secondary to blood loss (chronic): Secondary | ICD-10-CM | POA: Diagnosis not present

## 2019-12-07 LAB — CYTOLOGY - PAP
Comment: NEGATIVE
Diagnosis: NEGATIVE
High risk HPV: NEGATIVE

## 2019-12-07 NOTE — Addendum Note (Signed)
Addended by: Alba Cory F on: 12/07/2019 10:09 AM   Modules accepted: Orders

## 2019-12-08 DIAGNOSIS — N841 Polyp of cervix uteri: Secondary | ICD-10-CM | POA: Diagnosis not present

## 2019-12-08 LAB — HEMOGLOBIN A1C
Hgb A1c MFr Bld: 5.6 % of total Hgb (ref <5.7)
Mean Plasma Glucose: 114 (calc)
eAG (mmol/L): 6.3 (calc)

## 2019-12-08 LAB — HEPATITIS C ANTIBODY
Hepatitis C Ab: NONREACTIVE
SIGNAL TO CUT-OFF: 0.14 (ref <1.00)

## 2019-12-08 LAB — COMPLETE METABOLIC PANEL WITH GFR
AG Ratio: 1.4 (calc) (ref 1.0–2.5)
ALT: 18 U/L (ref 6–29)
AST: 16 U/L (ref 10–30)
Albumin: 4.1 g/dL (ref 3.6–5.1)
Alkaline phosphatase (APISO): 43 U/L (ref 31–125)
BUN: 10 mg/dL (ref 7–25)
CO2: 25 mmol/L (ref 20–32)
Calcium: 9.4 mg/dL (ref 8.6–10.2)
Chloride: 107 mmol/L (ref 98–110)
Creat: 0.81 mg/dL (ref 0.50–1.10)
GFR, Est African American: 105 mL/min/{1.73_m2} (ref >=60)
GFR, Est Non African American: 91 mL/min/{1.73_m2} (ref >=60)
Globulin: 3 g/dL (calc) (ref 1.9–3.7)
Glucose, Bld: 96 mg/dL (ref 65–99)
Potassium: 4.3 mmol/L (ref 3.5–5.3)
Sodium: 138 mmol/L (ref 135–146)
Total Bilirubin: 0.4 mg/dL (ref 0.2–1.2)
Total Protein: 7.1 g/dL (ref 6.1–8.1)

## 2019-12-08 LAB — IRON,TIBC AND FERRITIN PANEL
%SAT: 15 % (calc) — ABNORMAL LOW (ref 16–45)
Ferritin: 5 ng/mL — ABNORMAL LOW (ref 16–154)
Iron: 53 ug/dL (ref 40–190)
TIBC: 361 mcg/dL (calc) (ref 250–450)

## 2019-12-08 LAB — LIPID PANEL
Cholesterol: 164 mg/dL (ref <200)
HDL: 55 mg/dL (ref >=50)
LDL Cholesterol (Calc): 96 mg/dL (calc)
Non-HDL Cholesterol (Calc): 109 mg/dL (calc) (ref <130)
Total CHOL/HDL Ratio: 3 (calc) (ref <5.0)
Triglycerides: 45 mg/dL (ref <150)

## 2019-12-08 LAB — CBC
HCT: 32.8 % — ABNORMAL LOW (ref 35.0–45.0)
Hemoglobin: 10.1 g/dL — ABNORMAL LOW (ref 11.7–15.5)
MCH: 25.4 pg — ABNORMAL LOW (ref 27.0–33.0)
MCHC: 30.8 g/dL — ABNORMAL LOW (ref 32.0–36.0)
MCV: 82.4 fL (ref 80.0–100.0)
MPV: 8.9 fL (ref 7.5–12.5)
Platelets: 358 10*3/uL (ref 140–400)
RBC: 3.98 10*6/uL (ref 3.80–5.10)
RDW: 14.8 % (ref 11.0–15.0)
WBC: 3.1 10*3/uL — ABNORMAL LOW (ref 3.8–10.8)

## 2019-12-08 LAB — VITAMIN D 25 HYDROXY (VIT D DEFICIENCY, FRACTURES): Vit D, 25-Hydroxy: 20 ng/mL — ABNORMAL LOW (ref 30–100)

## 2019-12-12 ENCOUNTER — Other Ambulatory Visit: Payer: Self-pay | Admitting: Family Medicine

## 2019-12-12 DIAGNOSIS — M9902 Segmental and somatic dysfunction of thoracic region: Secondary | ICD-10-CM | POA: Diagnosis not present

## 2019-12-12 DIAGNOSIS — D500 Iron deficiency anemia secondary to blood loss (chronic): Secondary | ICD-10-CM

## 2019-12-12 DIAGNOSIS — M542 Cervicalgia: Secondary | ICD-10-CM | POA: Diagnosis not present

## 2019-12-12 DIAGNOSIS — M9901 Segmental and somatic dysfunction of cervical region: Secondary | ICD-10-CM | POA: Diagnosis not present

## 2019-12-12 DIAGNOSIS — M546 Pain in thoracic spine: Secondary | ICD-10-CM | POA: Diagnosis not present

## 2019-12-12 LAB — PATHOLOGY REPORT

## 2019-12-12 LAB — TISSUE SPECIMEN

## 2019-12-12 MED ORDER — FERROUS SULFATE 325 (65 FE) MG PO TABS: 325.0000 mg | ORAL_TABLET | Freq: Three times a day (TID) | ORAL | 1 refills | Status: DC

## 2019-12-13 DIAGNOSIS — M9901 Segmental and somatic dysfunction of cervical region: Secondary | ICD-10-CM | POA: Diagnosis not present

## 2019-12-13 DIAGNOSIS — M542 Cervicalgia: Secondary | ICD-10-CM | POA: Diagnosis not present

## 2019-12-13 DIAGNOSIS — M546 Pain in thoracic spine: Secondary | ICD-10-CM | POA: Diagnosis not present

## 2019-12-13 DIAGNOSIS — M9902 Segmental and somatic dysfunction of thoracic region: Secondary | ICD-10-CM | POA: Diagnosis not present

## 2019-12-14 DIAGNOSIS — M546 Pain in thoracic spine: Secondary | ICD-10-CM | POA: Diagnosis not present

## 2019-12-14 DIAGNOSIS — M9902 Segmental and somatic dysfunction of thoracic region: Secondary | ICD-10-CM | POA: Diagnosis not present

## 2019-12-14 DIAGNOSIS — M542 Cervicalgia: Secondary | ICD-10-CM | POA: Diagnosis not present

## 2019-12-14 DIAGNOSIS — M9901 Segmental and somatic dysfunction of cervical region: Secondary | ICD-10-CM | POA: Diagnosis not present

## 2019-12-16 DIAGNOSIS — M9901 Segmental and somatic dysfunction of cervical region: Secondary | ICD-10-CM | POA: Diagnosis not present

## 2019-12-16 DIAGNOSIS — M546 Pain in thoracic spine: Secondary | ICD-10-CM | POA: Diagnosis not present

## 2019-12-16 DIAGNOSIS — M9902 Segmental and somatic dysfunction of thoracic region: Secondary | ICD-10-CM | POA: Diagnosis not present

## 2019-12-16 DIAGNOSIS — M542 Cervicalgia: Secondary | ICD-10-CM | POA: Diagnosis not present

## 2019-12-20 DIAGNOSIS — M9901 Segmental and somatic dysfunction of cervical region: Secondary | ICD-10-CM | POA: Diagnosis not present

## 2019-12-20 DIAGNOSIS — M546 Pain in thoracic spine: Secondary | ICD-10-CM | POA: Diagnosis not present

## 2019-12-20 DIAGNOSIS — M9902 Segmental and somatic dysfunction of thoracic region: Secondary | ICD-10-CM | POA: Diagnosis not present

## 2019-12-20 DIAGNOSIS — M542 Cervicalgia: Secondary | ICD-10-CM | POA: Diagnosis not present

## 2019-12-21 DIAGNOSIS — M546 Pain in thoracic spine: Secondary | ICD-10-CM | POA: Diagnosis not present

## 2019-12-21 DIAGNOSIS — M542 Cervicalgia: Secondary | ICD-10-CM | POA: Diagnosis not present

## 2019-12-21 DIAGNOSIS — M9901 Segmental and somatic dysfunction of cervical region: Secondary | ICD-10-CM | POA: Diagnosis not present

## 2019-12-21 DIAGNOSIS — M9902 Segmental and somatic dysfunction of thoracic region: Secondary | ICD-10-CM | POA: Diagnosis not present

## 2019-12-23 DIAGNOSIS — M9901 Segmental and somatic dysfunction of cervical region: Secondary | ICD-10-CM | POA: Diagnosis not present

## 2019-12-23 DIAGNOSIS — M9902 Segmental and somatic dysfunction of thoracic region: Secondary | ICD-10-CM | POA: Diagnosis not present

## 2019-12-23 DIAGNOSIS — M542 Cervicalgia: Secondary | ICD-10-CM | POA: Diagnosis not present

## 2019-12-23 DIAGNOSIS — M546 Pain in thoracic spine: Secondary | ICD-10-CM | POA: Diagnosis not present

## 2019-12-27 DIAGNOSIS — M9902 Segmental and somatic dysfunction of thoracic region: Secondary | ICD-10-CM | POA: Diagnosis not present

## 2019-12-27 DIAGNOSIS — M542 Cervicalgia: Secondary | ICD-10-CM | POA: Diagnosis not present

## 2019-12-27 DIAGNOSIS — M546 Pain in thoracic spine: Secondary | ICD-10-CM | POA: Diagnosis not present

## 2019-12-27 DIAGNOSIS — M9901 Segmental and somatic dysfunction of cervical region: Secondary | ICD-10-CM | POA: Diagnosis not present

## 2019-12-28 DIAGNOSIS — M546 Pain in thoracic spine: Secondary | ICD-10-CM | POA: Diagnosis not present

## 2019-12-28 DIAGNOSIS — M9902 Segmental and somatic dysfunction of thoracic region: Secondary | ICD-10-CM | POA: Diagnosis not present

## 2019-12-28 DIAGNOSIS — M9901 Segmental and somatic dysfunction of cervical region: Secondary | ICD-10-CM | POA: Diagnosis not present

## 2019-12-28 DIAGNOSIS — M542 Cervicalgia: Secondary | ICD-10-CM | POA: Diagnosis not present

## 2019-12-30 ENCOUNTER — Observation Stay
Admission: EM | Admit: 2019-12-30 | Discharge: 2020-01-01 | Disposition: A | Discharge status: Home / Self Care | Payer: BC Managed Care – PPO | Attending: Internal Medicine | Admitting: Internal Medicine

## 2019-12-30 ENCOUNTER — Emergency Department: Payer: BC Managed Care – PPO

## 2019-12-30 ENCOUNTER — Other Ambulatory Visit: Payer: Self-pay

## 2019-12-30 ENCOUNTER — Encounter: Payer: Self-pay | Admitting: Emergency Medicine

## 2019-12-30 DIAGNOSIS — M9902 Segmental and somatic dysfunction of thoracic region: Secondary | ICD-10-CM | POA: Diagnosis not present

## 2019-12-30 DIAGNOSIS — M9901 Segmental and somatic dysfunction of cervical region: Secondary | ICD-10-CM | POA: Diagnosis not present

## 2019-12-30 DIAGNOSIS — Z885 Allergy status to narcotic agent status: Secondary | ICD-10-CM | POA: Insufficient documentation

## 2019-12-30 DIAGNOSIS — Z20822 Contact with and (suspected) exposure to covid-19: Secondary | ICD-10-CM | POA: Diagnosis not present

## 2019-12-30 DIAGNOSIS — I313 Pericardial effusion (noninflammatory): Secondary | ICD-10-CM | POA: Insufficient documentation

## 2019-12-30 DIAGNOSIS — Z8249 Family history of ischemic heart disease and other diseases of the circulatory system: Secondary | ICD-10-CM | POA: Diagnosis not present

## 2019-12-30 DIAGNOSIS — I341 Nonrheumatic mitral (valve) prolapse: Secondary | ICD-10-CM | POA: Diagnosis not present

## 2019-12-30 DIAGNOSIS — M542 Cervicalgia: Secondary | ICD-10-CM | POA: Diagnosis not present

## 2019-12-30 DIAGNOSIS — J90 Pleural effusion, not elsewhere classified: Secondary | ICD-10-CM | POA: Insufficient documentation

## 2019-12-30 DIAGNOSIS — R0602 Shortness of breath: Secondary | ICD-10-CM | POA: Diagnosis not present

## 2019-12-30 DIAGNOSIS — M546 Pain in thoracic spine: Secondary | ICD-10-CM | POA: Diagnosis not present

## 2019-12-30 DIAGNOSIS — R0600 Dyspnea, unspecified: Secondary | ICD-10-CM | POA: Diagnosis not present

## 2019-12-30 DIAGNOSIS — I3139 Other pericardial effusion (noninflammatory): Secondary | ICD-10-CM

## 2019-12-30 LAB — CBC WITH DIFFERENTIAL/PLATELET
Abs Immature Granulocytes: 0.02 10*3/uL (ref 0.00–0.07)
Basophils Absolute: 0 10*3/uL (ref 0.0–0.1)
Basophils Relative: 1 %
Eosinophils Absolute: 0.2 10*3/uL (ref 0.0–0.5)
Eosinophils Relative: 3 %
HCT: 35.8 % — ABNORMAL LOW (ref 36.0–46.0)
Hemoglobin: 11.6 g/dL — ABNORMAL LOW (ref 12.0–15.0)
Immature Granulocytes: 0 %
Lymphocytes Relative: 40 %
Lymphs Abs: 2.6 10*3/uL (ref 0.7–4.0)
MCH: 27 pg (ref 26.0–34.0)
MCHC: 32.4 g/dL (ref 30.0–36.0)
MCV: 83.3 fL (ref 80.0–100.0)
Monocytes Absolute: 0.6 10*3/uL (ref 0.1–1.0)
Monocytes Relative: 9 %
Neutro Abs: 3.1 10*3/uL (ref 1.7–7.7)
Neutrophils Relative %: 47 %
Platelets: 295 10*3/uL (ref 150–400)
RBC: 4.3 MIL/uL (ref 3.87–5.11)
RDW: 19.3 % — ABNORMAL HIGH (ref 11.5–15.5)
WBC: 6.5 10*3/uL (ref 4.0–10.5)
nRBC: 0 % (ref 0.0–0.2)

## 2019-12-30 LAB — COMPREHENSIVE METABOLIC PANEL
ALT: 19 U/L (ref 0–44)
AST: 22 U/L (ref 15–41)
Albumin: 4 g/dL (ref 3.5–5.0)
Alkaline Phosphatase: 55 U/L (ref 38–126)
Anion gap: 10 (ref 5–15)
BUN: 9 mg/dL (ref 6–20)
CO2: 20 mmol/L — ABNORMAL LOW (ref 22–32)
Calcium: 9.5 mg/dL (ref 8.9–10.3)
Chloride: 108 mmol/L (ref 98–111)
Creatinine, Ser: 0.82 mg/dL (ref 0.44–1.00)
GFR calc Af Amer: >60 mL/min (ref >60)
GFR calc non Af Amer: >60 mL/min (ref >60)
Glucose, Bld: 94 mg/dL (ref 70–99)
Potassium: 3.6 mmol/L (ref 3.5–5.1)
Sodium: 138 mmol/L (ref 135–145)
Total Bilirubin: 0.5 mg/dL (ref 0.3–1.2)
Total Protein: 7.9 g/dL (ref 6.5–8.1)

## 2019-12-30 LAB — FIBRIN DERIVATIVES D-DIMER (ARMC ONLY): Fibrin derivatives D-dimer (ARMC): 432.55 ng/mL (FEU) (ref 0.00–499.00)

## 2019-12-30 LAB — POC SARS CORONAVIRUS 2 AG: SARS Coronavirus 2 Ag: NEGATIVE

## 2019-12-30 MED ORDER — LORAZEPAM 2 MG/ML IJ SOLN
1.0000 mg | Freq: Once | INTRAMUSCULAR | Status: AC
  Administered 2019-12-30: 1 mg via INTRAVENOUS

## 2019-12-30 MED ORDER — LORAZEPAM 2 MG/ML IJ SOLN
INTRAMUSCULAR | Status: AC
  Filled 2019-12-30: qty 1

## 2019-12-30 NOTE — ED Triage Notes (Signed)
First Nurse: patient with complaint of shortness of breath. Patient oxygen saturation 100% on room air and heart rate 87.

## 2019-12-30 NOTE — ED Triage Notes (Signed)
Pt arrives POV to triage with SOB. Pt is experiencing a hard work of breathing at this time. Pt has hx of pericardial effusion.

## 2019-12-30 NOTE — ED Provider Notes (Signed)
[facility] Emergency Department Provider Note  ____________________________________________   First MD Initiated Contact with Patient 12/30/19 2307     (approximate)  I have reviewed the triage vital signs and the nursing notes.   HISTORY  Chief Complaint Shortness of Breath    HPI Katie Hays is a 39 y.o. female with below list of previous medical conditions including bacterial meningitis x2 and pericardial effusion presents to the emergency department secondary to acute onset of respiratory difficulty at 9:30 PM tonight.  Patient states that she has been "sick all week with loss of voice and fever.  Patient denies any known sick contact.  Patient's respiratory rate 40 at present.  Patient denies any chest pain.  Patient does admit to multiple family members brothers and sisters with myocardial infarction in their 66s and 13s.       Past Medical History:  Diagnosis Date  . Anemia   . History of meningitis   . Pericardial effusion     Patient Active Problem List   Diagnosis Date Noted  . Acute dyspnea 12/31/2019  . Pericardial effusion 12/31/2019  . Vitamin D deficiency 01/03/2019  . Family history of heart disease in female family member before age 41 07/04/2016  . MVP (mitral valve prolapse) 10/28/2015  . Migraine headache 10/25/2015  . H/O cesarean section 12/02/2012  . History of blood transfusion 12/02/2012    Past Surgical History:  Procedure Laterality Date  . CESAREAN SECTION  04/30/2016   04/16/2012  . TUBAL LIGATION Bilateral 04/30/2016    Prior to Admission medications   Medication Sig Start Date End Date Taking? Authorizing Provider  cetirizine (ZYRTEC) 10 MG tablet Take 10 mg by mouth daily.   Yes [provider]  ferrous sulfate 325 (65 FE) MG tablet Take 1 tablet (325 mg total) by mouth 3 (three) times daily with meals. Take colace if needed 12/12/19  Yes Sowles, Drue Stager, MD  Multiple Vitamins-Minerals  (MULTIVITAMIN GUMMIES WOMENS PO) Take 2 each by mouth daily. VitaFusion Organic   Yes [provider]    Allergies Dilaudid [hydromorphone hcl]  Family History  Problem Relation Age of Onset  . Arthritis Mother   . Heart disease Father   . Diabetes Father   . Hypertension Father   . Asthma Sister   . Hypertension Sister   . Diabetes Sister   . Asthma Brother   . Heart attack Brother   . Asthma Sister   . Hypertension Sister   . Heart attack Sister     Social History Social History   Tobacco Use  . Smoking status: Never Smoker  . Smokeless tobacco: Never Used  Substance Use Topics  . Alcohol use: Never    Comment: never  . Drug use: No    Review of Systems Constitutional: No fever/chills Eyes: No visual changes. ENT: No sore throat. Cardiovascular: Denies chest pain. Respiratory: Positive for shortness of breath. Gastrointestinal: No abdominal pain.  No nausea, no vomiting.  No diarrhea.  No constipation. Genitourinary: Negative for dysuria. Musculoskeletal: Negative for neck pain.  Negative for back pain. Integumentary: Negative for rash. Neurological: Negative for headaches, focal weakness or numbness.   ____________________________________________   PHYSICAL EXAM:  VITAL SIGNS: ED Triage Vitals  Enc Vitals Group     BP 12/30/19 2303 119/84     Pulse Rate 12/30/19 2303 84     Resp 12/30/19 2303 17     Temp 12/30/19 2303 98.3 F (36.8 C)  Temp Source 12/30/19 2303 Oral     SpO2 --      Weight 12/30/19 2300 77.1 kg (170 lb)     Height 12/30/19 2300 1.702 m (5\' 7" )     Head Circumference --      Peak Flow --      Pain Score 12/30/19 2259 0     Pain Loc --      Pain Edu? --      Excl. in GC? --     Constitutional: Alert and oriented.  Apparent respiratory difficulty Eyes: Conjunctivae are normal.  Head: Atraumatic. Mouth/Throat: Patient is wearing a mask. Neck: No stridor.  No meningeal signs.   Cardiovascular: Normal rate, regular  rhythm. Good peripheral circulation. Grossly normal heart sounds. Respiratory: Normal respiratory effort.  No retractions. Gastrointestinal: Soft and nontender. No distention.  Musculoskeletal: No lower extremity tenderness nor edema. No gross deformities of extremities. Neurologic:  Normal speech and language. No gross focal neurologic deficits are appreciated.  Skin:  Skin is warm, dry and intact. Psychiatric: Mood and affect are normal. Speech and behavior are normal.  ____________________________________________   LABS (all labs ordered are listed, but only abnormal results are displayed)  Labs Reviewed  CBC WITH DIFFERENTIAL/PLATELET - Abnormal; Notable for the following components:      Result Value   Hemoglobin 11.6 (*)    HCT 35.8 (*)    RDW 19.3 (*)    All other components within normal limits  COMPREHENSIVE METABOLIC PANEL - Abnormal; Notable for the following components:   CO2 20 (*)    All other components within normal limits  BLOOD GAS, VENOUS - Abnormal; Notable for the following components:   pH, Ven 7.62 (*)    pCO2, Ven <19.0 (*)    pO2, Ven 102.0 (*)    Bicarbonate 18.5 (*)    All other components within normal limits  SEDIMENTATION RATE - Abnormal; Notable for the following components:   Sed Rate 35 (*)    All other components within normal limits  CBC - Abnormal; Notable for the following components:   Hemoglobin 10.7 (*)    HCT 33.6 (*)    RDW 19.1 (*)    All other components within normal limits  RESPIRATORY PANEL BY RT PCR (FLU A&B, COVID)  BRAIN NATRIURETIC PEPTIDE  FIBRIN DERIVATIVES D-DIMER ([facility] ONLY)  TSH  HIV ANTIBODY (ROUTINE TESTING W REFLEX)  POC SARS CORONAVIRUS 2 AG -  ED  POC SARS CORONAVIRUS 2 AG  TYPE AND SCREEN  TROPONIN I (HIGH SENSITIVITY)  TROPONIN I (HIGH SENSITIVITY)   ____________________________________________  EKG  ED ECG REPORT I, [place] N Daoud Lobue, the attending physician, personally viewed and interpreted this  ECG.   Date: 12/30/2019  EKG Time: 11:02 PM  Rate: 84  Rhythm: Normal sinus rhythm  Axis: Normal  Intervals:normal  ST&T Change: None  ____________________________________________  RADIOLOGY I, [place] N Zebulin Siegel, personally viewed and evaluated these images (plain radiographs) as part of my medical decision making, as well as reviewing the written report by the radiologist.  ED MD interpretation: CT angiogram revealed no evidence of acute pulmonary emboli small to moderate sized pericardial effusion slightly increased in size with trace bilateral pleural effusions right greater than left on CT chest per radiologist  Official radiology report(s): CT Angio Chest PE W and/or Wo Contrast  Result Date: 12/31/2019 CLINICAL DATA:  Shortness of breath. History of pericardial effusion. EXAM: CT ANGIOGRAPHY CHEST WITH CONTRAST TECHNIQUE: Multidetector CT imaging of the chest was performed  using the standard protocol during bolus administration of intravenous contrast. Multiplanar CT image reconstructions and MIPs were obtained to evaluate the vascular anatomy. CONTRAST:  56mL OMNIPAQUE IOHEXOL 350 MG/ML SOLN COMPARISON:  CT dated February 15, 2019. FINDINGS: Cardiovascular: Contrast injection is sufficient to demonstrate satisfactory opacification of the pulmonary arteries to the segmental level. There is no pulmonary embolus. The main pulmonary artery is within normal limits for size. There is no CT evidence of acute right heart strain. The visualized aorta is normal. Heart size is mildly enlarged. Again noted is a small to moderate-sized pericardial effusion with some redistribution in fluid but likely overall slight interval increase in size. Mediastinum/Nodes: --No mediastinal or hilar lymphadenopathy. --No axillary lymphadenopathy. --No supraclavicular lymphadenopathy. --Normal thyroid gland. --The esophagus is unremarkable Lungs/Pleura: There are trace bilateral pleural effusions, right greater than left.  There is no pneumothorax. No large pleural effusion. There is no focal infiltrate. Upper Abdomen: No acute abnormality. Musculoskeletal: No chest wall abnormality. No acute or significant osseous findings. Review of the MIP images confirms the above findings. IMPRESSION: 1. No evidence for acute pulmonary embolism. 2. Small to moderate-sized pericardial effusion with some redistribution in fluid but likely overall slight interval increase in size. 3. Trace bilateral pleural effusions, right greater than left. Electronically Signed   By: Katherine Mantle M.D.   On: 12/31/2019 00:54   DG Chest Port 1 View  Result Date: 12/30/2019 CLINICAL DATA:  Shortness of breath. History of pericardial effusion. EXAM: PORTABLE CHEST 1 VIEW COMPARISON:  02/18/2019 FINDINGS: Allowing for the portable AP technique, heart size is the same as was seen 1 year ago. Mediastinal shadows are otherwise normal. The lungs are clear. IMPRESSION: No active disease. Heart size unchanged when compared to last year allowing for the portable AP technique. At that time, the patient did have a moderate pericardial effusion. Electronically Signed   By: Paulina Fusi M.D.   On: 12/30/2019 23:39     Procedures   ____________________________________________   INITIAL IMPRESSION / MDM / ASSESSMENT AND PLAN / ED COURSE  As part of my medical decision making, I reviewed the following data within the electronic MEDICAL RECORD NUMBER  39 year old female presented with above-stated history and physical exam secondary to dyspnea with hyperventilation differential diagnosis including but not limited to pulmonary emboli pleural effusion pericardial effusion ACS hyperventilation secondary to anxiety..  Nonrebreather applied to the patient and 1 mg of IV Ativan administered upon reevaluation patient tachypnea resolved however patient continues to have dyspnea.  Chest x-ray revealed no acute abnormality.  CT chest revealed slightly increased pericardial  effusion in comparison to previous scan.  Laboratory data revealed CO2 of less than 19 on blood gas, normal troponin and BNP.  However given ongoing dyspnea with increasing pericardial effusion and bilateral pleural effusion will admit the patient to the hospital for further evaluation and management.  Patient discussed with Dr. Para March      ____________________________________________  FINAL CLINICAL IMPRESSION(S) / ED DIAGNOSES  Final diagnoses:  Dyspnea, unspecified type  Pericardial effusion without cardiac tamponade     MEDICATIONS GIVEN DURING THIS VISIT:  Medications  enoxaparin (LOVENOX) injection 40 mg (has no administration in time range)  ondansetron (ZOFRAN) tablet 4 mg (has no administration in time range)    Or  ondansetron (ZOFRAN) injection 4 mg (has no administration in time range)  acetaminophen (TYLENOL) tablet 650 mg (has no administration in time range)    Or  acetaminophen (TYLENOL) suppository 650 mg (has no administration in time  range)  LORazepam (ATIVAN) tablet 0.5 mg (has no administration in time range)  LORazepam (ATIVAN) injection 1 mg (1 mg Intravenous Given 12/30/19 2349)  iohexol (OMNIPAQUE) 350 MG/ML injection 75 mL (75 mLs Intravenous Contrast Given 12/31/19 0035)     ED Discharge Orders    None      *Please note:  BEYONKA PITNEY was evaluated in Emergency Department on 12/31/2019 for the symptoms described in the history of present illness. She was evaluated in the context of the global COVID-19 pandemic, which necessitated consideration that the patient might be at risk for infection with the SARS-CoV-2 virus that causes COVID-19. Institutional protocols and algorithms that pertain to the evaluation of patients at risk for COVID-19 are in a state of rapid change based on information released by regulatory bodies including the CDC and federal and state organizations. These policies and algorithms were followed during the patient's care in the ED.   Some ED evaluations and interventions may be delayed as a result of limited staffing during the pandemic.*  Note:  This document was prepared using Dragon voice recognition software and may include unintentional dictation errors.   Darci Current, MD 12/31/19 [phone]

## 2019-12-31 ENCOUNTER — Observation Stay
Admit: 2019-12-31 | Discharge: 2019-12-31 | Disposition: A | Discharge status: Home / Self Care | Payer: BC Managed Care – PPO | Attending: Internal Medicine | Admitting: Internal Medicine

## 2019-12-31 ENCOUNTER — Emergency Department: Payer: BC Managed Care – PPO

## 2019-12-31 ENCOUNTER — Encounter: Payer: Self-pay | Admitting: Radiology

## 2019-12-31 DIAGNOSIS — I313 Pericardial effusion (noninflammatory): Secondary | ICD-10-CM

## 2019-12-31 DIAGNOSIS — I3139 Other pericardial effusion (noninflammatory): Secondary | ICD-10-CM

## 2019-12-31 DIAGNOSIS — R0600 Dyspnea, unspecified: Secondary | ICD-10-CM | POA: Diagnosis not present

## 2019-12-31 DIAGNOSIS — R0602 Shortness of breath: Secondary | ICD-10-CM | POA: Diagnosis not present

## 2019-12-31 LAB — BLOOD GAS, VENOUS
Acid-base deficit: 0.3 mmol/L (ref 0.0–2.0)
Bicarbonate: 18.5 mmol/L — ABNORMAL LOW (ref 20.0–28.0)
O2 Saturation: 98.8 %
Patient temperature: 37
pCO2, Ven: <19 mmHg — CL (ref 44.0–60.0)
pH, Ven: 7.62 (ref 7.250–7.430)
pO2, Ven: 102 mmHg — ABNORMAL HIGH (ref 32.0–45.0)

## 2019-12-31 LAB — RESPIRATORY PANEL BY RT PCR (FLU A&B, COVID)
Influenza A by PCR: NEGATIVE
Influenza B by PCR: NEGATIVE
SARS Coronavirus 2 by RT PCR: NEGATIVE

## 2019-12-31 LAB — ECHOCARDIOGRAM COMPLETE
Height: 67 in
Weight: 2720 oz

## 2019-12-31 LAB — TYPE AND SCREEN
ABO/RH(D): O POS
Antibody Screen: NEGATIVE

## 2019-12-31 LAB — HIV ANTIBODY (ROUTINE TESTING W REFLEX): HIV Screen 4th Generation wRfx: NONREACTIVE

## 2019-12-31 LAB — SEDIMENTATION RATE: Sed Rate: 35 mm/hr — ABNORMAL HIGH (ref 0–20)

## 2019-12-31 LAB — TROPONIN I (HIGH SENSITIVITY)
Troponin I (High Sensitivity): <2 ng/L (ref <18)
Troponin I (High Sensitivity): 3 ng/L (ref <18)

## 2019-12-31 LAB — CBC
HCT: 33.6 % — ABNORMAL LOW (ref 36.0–46.0)
Hemoglobin: 10.7 g/dL — ABNORMAL LOW (ref 12.0–15.0)
MCH: 26.6 pg (ref 26.0–34.0)
MCHC: 31.8 g/dL (ref 30.0–36.0)
MCV: 83.4 fL (ref 80.0–100.0)
Platelets: 271 10*3/uL (ref 150–400)
RBC: 4.03 MIL/uL (ref 3.87–5.11)
RDW: 19.1 % — ABNORMAL HIGH (ref 11.5–15.5)
WBC: 5.6 10*3/uL (ref 4.0–10.5)
nRBC: 0 % (ref 0.0–0.2)

## 2019-12-31 LAB — BRAIN NATRIURETIC PEPTIDE: B Natriuretic Peptide: 21 pg/mL (ref 0.0–100.0)

## 2019-12-31 LAB — TSH: TSH: 1.864 u[IU]/mL (ref 0.350–4.500)

## 2019-12-31 MED ORDER — LORAZEPAM 0.5 MG PO TABS: 0.5000 mg | ORAL_TABLET | Freq: Four times a day (QID) | ORAL | Status: DC | PRN

## 2019-12-31 MED ORDER — ENOXAPARIN SODIUM 40 MG/0.4ML ~~LOC~~ SOLN
40.0000 mg | SUBCUTANEOUS | Status: DC
  Administered 2019-12-31 – 2020-01-01 (×2): 40 mg via SUBCUTANEOUS
  Filled 2019-12-31 (×2): qty 0.4

## 2019-12-31 MED ORDER — ACETAMINOPHEN 650 MG RE SUPP: 650.0000 mg | Freq: Four times a day (QID) | RECTAL | Status: DC | PRN

## 2019-12-31 MED ORDER — ONDANSETRON HCL 4 MG/2ML IJ SOLN: 4.0000 mg | Freq: Four times a day (QID) | INTRAMUSCULAR | Status: DC | PRN

## 2019-12-31 MED ORDER — ACETAMINOPHEN 325 MG PO TABS
650.0000 mg | ORAL_TABLET | Freq: Four times a day (QID) | ORAL | Status: DC | PRN
  Administered 2019-12-31 – 2020-01-01 (×2): 650 mg via ORAL
  Filled 2019-12-31 (×2): qty 2

## 2019-12-31 MED ORDER — ONDANSETRON HCL 4 MG PO TABS: 4.0000 mg | ORAL_TABLET | Freq: Four times a day (QID) | ORAL | Status: DC | PRN

## 2019-12-31 MED ORDER — IOHEXOL 350 MG/ML SOLN
75.0000 mL | Freq: Once | INTRAVENOUS | Status: AC | PRN
  Administered 2019-12-31: 75 mL via INTRAVENOUS

## 2019-12-31 NOTE — H&P (Signed)
History and Physical    KEIYANA Hays INO:[id number] DOB: 1981-07-24 DOA: 12/30/2019  PCP: Alba Cory, MD   Patient coming from: home I have personally briefly reviewed patient's old medical records in [facility]  Chief Complaint: Shortness of breath  HPI: Katie Hays is a 39 y.o. female with medical history significant for history of mitral valve prolapse and history of trace pericardial effusion on echocardiogram from 2018, history of a hospitalization in March 2020 for pneumonia, who presents to the emergency room in respiratory distress and tachypneic with respirations up to 40 but maintaining sats at 100% on room air.  She denied chest pain, fever or chills.  Denied palpitations or lightheadedness or headache  ED Course: On arrival in the emergency room she was afebrile with blood pressure 119/84, heart rate 84 respirations 35 with O2 sat 100% on room air.  Venous blood gas showed pH of 7.62, venous PCO2 less than 19 and venous PO2 102.  Her blood work was mostly unremarkable.  Hemoglobin 11.6.  D-dimer was 432.  Flu and Covid test negative.  TSH normal at 1.86.  BNP was 21.   She had a CT chest that showed no evidence of acute PE but did show small to moderate-sized pericardial effusion with some redistribution and fluid but likely overall slight interval increase in size.  Trace bilateral effusions right greater than left.  She received Ativan in the emergency room with significant improvement in her symptoms.  Hospitalist consulted for evaluation of cardiac etiology.  Sed rate and troponin ordered at time of admission. Review of Systems: Patient drowsy from effect of Ativan given in the ER and unable to contribute meaningfully  Past Medical History:  Diagnosis Date  . Anemia   . History of meningitis   . Pericardial effusion     Past Surgical History:  Procedure Laterality Date  . CESAREAN SECTION  04/30/2016   04/16/2012  . TUBAL LIGATION Bilateral 04/30/2016      reports that she has never smoked. She has never used smokeless tobacco. She reports that she does not drink alcohol or use drugs.  Allergies  Allergen Reactions  . Dilaudid [Hydromorphone Hcl] Swelling    Family History  Problem Relation Age of Onset  . Arthritis Mother   . Heart disease Father   . Diabetes Father   . Hypertension Father   . Asthma Sister   . Hypertension Sister   . Diabetes Sister   . Asthma Brother   . Heart attack Brother   . Asthma Sister   . Hypertension Sister   . Heart attack Sister      Prior to Admission medications   Medication Sig Start Date End Date Taking? Authorizing Provider  cetirizine (ZYRTEC) 10 MG tablet Take 10 mg by mouth daily.   Yes [provider]  ferrous sulfate 325 (65 FE) MG tablet Take 1 tablet (325 mg total) by mouth 3 (three) times daily with meals. Take colace if needed 12/12/19  Yes Sowles, Danna Hefty, MD  Multiple Vitamins-Minerals (MULTIVITAMIN GUMMIES WOMENS PO) Take 2 each by mouth daily. VitaFusion Organic   Yes [provider]    Physical Exam: Vitals:   12/30/19 2303 12/30/19 2345 12/31/19 0000 12/31/19 0100  BP: 119/84 130/65 (!) 115/51 (!) 94/50  Pulse: 84 95 75 69  Resp: (!) 35 (!) 22 (!) 28 12  Temp: 98.3 F (36.8 C)     TempSrc: Oral     SpO2:  100% 100% 99%  Weight:      Height:         Vitals:   12/30/19 2303 12/30/19 2345 12/31/19 0000 12/31/19 0100  BP: 119/84 130/65 (!) 115/51 (!) 94/50  Pulse: 84 95 75 69  Resp: (!) 35 (!) 22 (!) 28 12  Temp: 98.3 F (36.8 C)     TempSrc: Oral     SpO2:  100% 100% 99%  Weight:      Height:        Constitutional: drowsy but NAD, arousable but falls back asleep Eyes: PERRL, lids and conjunctivae normal ENMT: Mucous membranes are moist.  Neck: normal, supple, no masses, no thyromegaly Respiratory: clear to auscultation bilaterally, no wheezing, no crackles. Normal respiratory effort. No accessory muscle use.  Cardiovascular: Regular  rate and rhythm, no murmurs / rubs / gallops. No extremity edema. 2+ pedal pulses. No carotid bruits.  Abdomen: no tenderness, no masses palpated. No hepatosplenomegaly. Bowel sounds positive.  Musculoskeletal: no clubbing / cyanosis. No joint deformity upper and lower extremities.  Skin: no rashes, lesions, ulcers.  Neurologic: No gross focal neurologic deficit.    Labs on Admission: I have personally reviewed following labs and imaging studies  CBC: Recent Labs  Lab 12/30/19 2314  WBC 6.5  NEUTROABS 3.1  HGB 11.6*  HCT 35.8*  MCV 83.3  PLT 295   Basic Metabolic Panel: Recent Labs  Lab 12/30/19 2314  NA 138  K 3.6  CL 108  CO2 20*  GLUCOSE 94  BUN 9  CREATININE 0.82  CALCIUM 9.5   GFR: Estimated Creatinine Clearance: 97.6 mL/min (by C-G formula based on SCr of 0.82 mg/dL). Liver Function Tests: Recent Labs  Lab 12/30/19 2314  AST 22  ALT 19  ALKPHOS 55  BILITOT 0.5  PROT 7.9  ALBUMIN 4.0   No results for input(s): LIPASE, AMYLASE in the last 168 hours. No results for input(s): AMMONIA in the last 168 hours. Coagulation Profile: No results for input(s): INR, PROTIME in the last 168 hours. Cardiac Enzymes: No results for input(s): CKTOTAL, CKMB, CKMBINDEX, TROPONINI in the last 168 hours. BNP (last 3 results) No results for input(s): PROBNP in the last 8760 hours. HbA1C: No results for input(s): HGBA1C in the last 72 hours. CBG: No results for input(s): GLUCAP in the last 168 hours. Lipid Profile: No results for input(s): CHOL, HDL, LDLCALC, TRIG, CHOLHDL, LDLDIRECT in the last 72 hours. Thyroid Function Tests: Recent Labs    12/30/19 2314  TSH 1.864   Anemia Panel: No results for input(s): VITAMINB12, FOLATE, FERRITIN, TIBC, IRON, RETICCTPCT in the last 72 hours. Urine analysis:    Component Value Date/Time   COLORURINE STRAW (A) 05/04/2016 0633   APPEARANCEUR CLEAR (A) 05/04/2016 0633   APPEARANCEUR Hazy 02/17/2013 1129   LABSPEC 1.009  05/04/2016 0633   LABSPEC 1.026 02/17/2013 1129   PHURINE 8.0 05/04/2016 0633   GLUCOSEU NEGATIVE 05/04/2016 0633   GLUCOSEU Negative 02/17/2013 1129   HGBUR 3+ (A) 05/04/2016 0633   BILIRUBINUR NEGATIVE 05/04/2016 0633   BILIRUBINUR Negative 02/17/2013 1129   KETONESUR NEGATIVE 05/04/2016 0633   PROTEINUR NEGATIVE 05/04/2016 0633   NITRITE NEGATIVE 05/04/2016 0633   LEUKOCYTESUR NEGATIVE 05/04/2016 0633   LEUKOCYTESUR Negative 02/17/2013 1129    Radiological Exams on Admission: CT Angio Chest PE W and/or Wo Contrast  Result Date: 12/31/2019 CLINICAL DATA:  Shortness of breath. History of pericardial effusion. EXAM: CT ANGIOGRAPHY CHEST WITH CONTRAST TECHNIQUE: Multidetector CT imaging of the chest was performed using the standard  protocol during bolus administration of intravenous contrast. Multiplanar CT image reconstructions and MIPs were obtained to evaluate the vascular anatomy. CONTRAST:  40mL OMNIPAQUE IOHEXOL 350 MG/ML SOLN COMPARISON:  CT dated February 15, 2019. FINDINGS: Cardiovascular: Contrast injection is sufficient to demonstrate satisfactory opacification of the pulmonary arteries to the segmental level. There is no pulmonary embolus. The main pulmonary artery is within normal limits for size. There is no CT evidence of acute right heart strain. The visualized aorta is normal. Heart size is mildly enlarged. Again noted is a small to moderate-sized pericardial effusion with some redistribution in fluid but likely overall slight interval increase in size. Mediastinum/Nodes: --No mediastinal or hilar lymphadenopathy. --No axillary lymphadenopathy. --No supraclavicular lymphadenopathy. --Normal thyroid gland. --The esophagus is unremarkable Lungs/Pleura: There are trace bilateral pleural effusions, right greater than left. There is no pneumothorax. No large pleural effusion. There is no focal infiltrate. Upper Abdomen: No acute abnormality. Musculoskeletal: No chest wall abnormality. No  acute or significant osseous findings. Review of the MIP images confirms the above findings. IMPRESSION: 1. No evidence for acute pulmonary embolism. 2. Small to moderate-sized pericardial effusion with some redistribution in fluid but likely overall slight interval increase in size. 3. Trace bilateral pleural effusions, right greater than left. Electronically Signed   By: Constance Holster M.D.   On: 12/31/2019 00:54   DG Chest Port 1 View  Result Date: 12/30/2019 CLINICAL DATA:  Shortness of breath. History of pericardial effusion. EXAM: PORTABLE CHEST 1 VIEW COMPARISON:  02/18/2019 FINDINGS: Allowing for the portable AP technique, heart size is the same as was seen 1 year ago. Mediastinal shadows are otherwise normal. The lungs are clear. IMPRESSION: No active disease. Heart size unchanged when compared to last year allowing for the portable AP technique. At that time, the patient did have a moderate pericardial effusion. Electronically Signed   By: Nelson Chimes M.D.   On: 12/30/2019 23:39    EKG: Independently reviewed.   Assessment/Plan  Acute dyspnea Pericardial effusion, chronic since 2016, with slight increase Mitral valve prolapse Trace pleural effusion on CT chest   Etiology of dyspnea uncertain  ABG was consistent with hyperventilation(venous pH 7.62, PCO2 less than 19, PO2 102) Differential includes cardiac etiology, though suspect  anxiety given rapid improvement with Ativan.  PE ruled out with CTA chest Etiology of pericardial and pleural effusions uncertain, possibly rheumatologic Echocardiogram to evaluate LVEF and better estimate degree of pericardial effusion Follow sed rate  Troponins pending Ativan 0.5 mg p.o. every 6 as needed for anxiety      DVT prophylaxis: lovenox  Code Status: full code  Family Communication: none  Disposition Plan: Back to previous home environment Consults called: none     Athena Masse MD [facility]     12/31/2019, 2:38  AM

## 2019-12-31 NOTE — Progress Notes (Signed)
*  PRELIMINARY RESULTS* Echocardiogram 2D Echocardiogram has been performed.  Garrel Ridgel Airrion Otting 12/31/2019, 10:59 AM

## 2019-12-31 NOTE — ED Notes (Addendum)
ED TO INPATIENT HANDOFF REPORT  ED Nurse Name and Phone #:  Elijah Birk RN  [phone]  S Name/Age/Gender Katie Hays 39 y.o. female Room/Bed: ED17A/ED17A  Code Status   Code Status: Full Code  Home/SNF/Other Home Patient oriented to: self, place, time and situation Is this baseline? Yes   Triage Complete: Triage complete  Chief Complaint Acute dyspnea [R06.00]  Triage Note First Nurse: patient with complaint of shortness of breath. Patient oxygen saturation 100% on room air and heart rate 87.   Pt arrives POV to triage with SOB. Pt is experiencing a hard work of breathing at this time. Pt has hx of pericardial effusion.     Allergies Allergies  Allergen Reactions  . Dilaudid [Hydromorphone Hcl] Swelling    Level of Care/Admitting Diagnosis ED Disposition    ED Disposition Condition Comment   Admit  Hospital Area: [facility] [[id number]]  Level of Care: Med-Surg [16]  Covid Evaluation: Confirmed COVID Negative  Diagnosis: Acute dyspnea [[id number]]  Admitting Physician: Andris Baumann [[id number]]  Attending Physician: Andris Baumann [[id number]]       B Medical/Surgery History Past Medical History:  Diagnosis Date  . Anemia   . History of meningitis   . Pericardial effusion    Past Surgical History:  Procedure Laterality Date  . CESAREAN SECTION  04/30/2016   04/16/2012  . TUBAL LIGATION Bilateral 04/30/2016     A IV Location/Drains/Wounds Patient Lines/Drains/Airways Status   Active Line/Drains/Airways    Name:   Placement date:   Placement time:   Site:   Days:   Peripheral IV 12/30/19 Left Forearm   12/30/19    2315    Forearm   1          Intake/Output Last 24 hours No intake or output data in the 24 hours ending 12/31/19 0320  Labs/Imaging Results for orders placed or performed during the hospital encounter of 12/30/19 (from the past 48 hour(s))  CBC with Differential     Status: Abnormal   Collection Time: 12/30/19 11:14 PM   Result Value Ref Range   WBC 6.5 4.0 - 10.5 K/uL   RBC 4.30 3.87 - 5.11 MIL/uL   Hemoglobin 11.6 (L) 12.0 - 15.0 g/dL   HCT 76.2 (L) 83.1 - 51.7 %   MCV 83.3 80.0 - 100.0 fL   MCH 27.0 26.0 - 34.0 pg   MCHC 32.4 30.0 - 36.0 g/dL   RDW 61.6 (H) 07.3 - 71.0 %   Platelets 295 150 - 400 K/uL   nRBC 0.0 0.0 - 0.2 %   Neutrophils Relative % 47 %   Neutro Abs 3.1 1.7 - 7.7 K/uL   Lymphocytes Relative 40 %   Lymphs Abs 2.6 0.7 - 4.0 K/uL   Monocytes Relative 9 %   Monocytes Absolute 0.6 0.1 - 1.0 K/uL   Eosinophils Relative 3 %   Eosinophils Absolute 0.2 0.0 - 0.5 K/uL   Basophils Relative 1 %   Basophils Absolute 0.0 0.0 - 0.1 K/uL   Immature Granulocytes 0 %   Abs Immature Granulocytes 0.02 0.00 - 0.07 K/uL    Comment: Performed at [facility], [address] [place], [place] [address]  Brain natriuretic peptide     Status: None   Collection Time: 12/30/19 11:14 PM  Result Value Ref Range   B Natriuretic Peptide 21.0 0.0 - 100.0 pg/mL    Comment: Performed at [facility], [address] [place], [place] [address]  Fibrin derivatives D-Dimer     Status: None   Collection Time: 12/30/19 11:14 PM  Result Value Ref Range   Fibrin derivatives D-dimer ([facility]) 432.55 0.00 - 499.00 ng/mL (FEU)    Comment: (NOTE) <> Exclusion of Venous Thromboembolism (VTE) - OUTPATIENT ONLY   (Emergency Department or [place])   0-499 ng/ml (FEU): With a low to intermediate pretest probability                      for VTE this test result excludes the diagnosis                      of VTE.   >499 ng/ml (FEU) : VTE not excluded; additional work up for VTE is                      required. <> Testing on Inpatients and Evaluation of Disseminated Intravascular   Coagulation (DIC) Reference Range:   0-499 ng/ml (FEU) Performed at [facility], [address] [place], [place] [address]   Comprehensive metabolic panel     Status: Abnormal   Collection Time: 12/30/19 11:14  PM  Result Value Ref Range   Sodium 138 135 - 145 mmol/L   Potassium 3.6 3.5 - 5.1 mmol/L   Chloride 108 98 - 111 mmol/L   CO2 20 (L) 22 - 32 mmol/L   Glucose, Bld 94 70 - 99 mg/dL   BUN 9 6 - 20 mg/dL   Creatinine, Ser 5.68 0.44 - 1.00 mg/dL   Calcium 9.5 8.9 - 12.7 mg/dL   Total Protein 7.9 6.5 - 8.1 g/dL   Albumin 4.0 3.5 - 5.0 g/dL   AST 22 15 - 41 U/L   ALT 19 0 - 44 U/L   Alkaline Phosphatase 55 38 - 126 U/L   Total Bilirubin 0.5 0.3 - 1.2 mg/dL   GFR calc non Af Amer >60 >60 mL/min   GFR calc Af Amer >60 >60 mL/min   Anion gap 10 5 - 15    Comment: Performed at [facility], [address] [place], [place] [address]  TSH     Status: None   Collection Time: 12/30/19 11:14 PM  Result Value Ref Range   TSH 1.864 0.350 - 4.500 uIU/mL    Comment: Performed by a 3rd Generation assay with a functional sensitivity of <=0.01 uIU/mL. Performed at [facility], [address] [place], [place] [address]   Troponin I (High Sensitivity)     Status: None   Collection Time: 12/30/19 11:14 PM  Result Value Ref Range   Troponin I (High Sensitivity) <2 <18 ng/L    Comment: (NOTE) Elevated high sensitivity troponin I (hsTnI) values and significant  changes across serial measurements may suggest ACS but many other  chronic and acute conditions are known to elevate hsTnI results.  Refer to the "Links" section for chest pain algorithms and additional  guidance. Performed at [facility], [address] [place], [place] [address]   Type and screen [facility]     Status: None   Collection Time: 12/30/19 11:24 PM  Result Value Ref Range   ABO/RH(D) O POS    Antibody Screen NEG    Sample Expiration      01/02/2020,2359 Performed at [facility], [address] [place], [place] [address]   Respiratory Panel by RT PCR (Flu A&B, Covid) - Nasopharyngeal Swab  Status: None   Collection Time: 12/30/19 11:24 PM   Specimen:  Nasopharyngeal Swab  Result Value Ref Range   SARS Coronavirus 2 by RT PCR NEGATIVE NEGATIVE    Comment: (NOTE) SARS-CoV-2 target nucleic acids are NOT DETECTED. The SARS-CoV-2 RNA is generally detectable in upper respiratoy specimens during the acute phase of infection. The lowest concentration of SARS-CoV-2 viral copies this assay can detect is 131 copies/mL. A negative result does not preclude SARS-Cov-2 infection and should not be used as the sole basis for treatment or other patient management decisions. A negative result may occur with  improper specimen collection/handling, submission of specimen other than nasopharyngeal swab, presence of viral mutation(s) within the areas targeted by this assay, and inadequate number of viral copies (<131 copies/mL). A negative result must be combined with clinical observations, patient history, and epidemiological information. The expected result is Negative. Fact Sheet for Patients:  PinkCheek.be Fact Sheet for Healthcare Providers:  GravelBags.it This test is not yet ap proved or cleared by the [place] FDA and  has been authorized for detection and/or diagnosis of SARS-CoV-2 by FDA under an Emergency Use Authorization (EUA). This EUA will remain  in effect (meaning this test can be used) for the duration of the COVID-19 declaration under Section 564(b)(1) of the Act, 21 U.S.C. section 360bbb-3(b)(1), unless the authorization is terminated or revoked sooner.    Influenza A by PCR NEGATIVE NEGATIVE   Influenza B by PCR NEGATIVE NEGATIVE    Comment: (NOTE) The Xpert Xpress SARS-CoV-2/FLU/RSV assay is intended as an aid in  the diagnosis of influenza from Nasopharyngeal swab specimens and  should not be used as a sole basis for treatment. Nasal washings and  aspirates are unacceptable for Xpert Xpress SARS-CoV-2/FLU/RSV  testing. Fact Sheet for  Patients: PinkCheek.be Fact Sheet for Healthcare Providers: GravelBags.it This test is not yet approved or cleared by the [place] FDA and  has been authorized for detection and/or diagnosis of SARS-CoV-2 by  FDA under an Emergency Use Authorization (EUA). This EUA will remain  in effect (meaning this test can be used) for the duration of the  Covid-19 declaration under Section 564(b)(1) of the Act, 21  U.S.C. section 360bbb-3(b)(1), unless the authorization is  terminated or revoked. Performed at [facility], [address] [place], [place] [address]   POC SARS Coronavirus 2 Ag     Status: None   Collection Time: 12/30/19 11:54 PM  Result Value Ref Range   SARS Coronavirus 2 Ag NEGATIVE NEGATIVE    Comment: (NOTE) SARS-CoV-2 antigen NOT DETECTED.  Negative results are presumptive.  Negative results do not preclude SARS-CoV-2 infection and should not be used as the sole basis for treatment or other patient management decisions, including infection  control decisions, particularly in the presence of clinical signs and  symptoms consistent with COVID-19, or in those who have been in contact with the virus.  Negative results must be combined with clinical observations, patient history, and epidemiological information. The expected result is Negative. Fact Sheet for Patients: PodPark.tn Fact Sheet for Healthcare Providers: GiftContent.is This test is not yet approved or cleared by the [place] FDA and  has been authorized for detection and/or diagnosis of SARS-CoV-2 by FDA under an Emergency Use Authorization (EUA).  This EUA will remain in effect (meaning this test can be used) for the duration of  the COVID-19 de claration under Section 564(b)(1) of the Act, 21 U.S.C. section 360bbb-3(b)(1), unless the authorization is terminated or revoked  sooner.   Blood gas, venous     Status: Abnormal   Collection Time: 12/31/19 12:00 AM  Result Value Ref Range   pH, Ven 7.62 (HH) 7.250 - 7.430    Comment: CRITICAL RESULT CALLED TO, READ BACK BY AND VERIFIED WITH: DR Manson Passey @ 0010  12/31/2019  JCG    pCO2, Ven <19.0 (LL) 44.0 - 60.0 mmHg   pO2, Ven 102.0 (H) 32.0 - 45.0 mmHg   Bicarbonate 18.5 (L) 20.0 - 28.0 mmol/L   Acid-base deficit 0.3 0.0 - 2.0 mmol/L   O2 Saturation 98.8 %   Patient temperature 37.0    Collection site VENOUS    Sample type VENOUS     Comment: Performed at [facility], [address] [place], [place] [address]   CT Angio Chest PE W and/or Wo Contrast  Result Date: 12/31/2019 CLINICAL DATA:  Shortness of breath. History of pericardial effusion. EXAM: CT ANGIOGRAPHY CHEST WITH CONTRAST TECHNIQUE: Multidetector CT imaging of the chest was performed using the standard protocol during bolus administration of intravenous contrast. Multiplanar CT image reconstructions and MIPs were obtained to evaluate the vascular anatomy. CONTRAST:  64mL OMNIPAQUE IOHEXOL 350 MG/ML SOLN COMPARISON:  CT dated February 15, 2019. FINDINGS: Cardiovascular: Contrast injection is sufficient to demonstrate satisfactory opacification of the pulmonary arteries to the segmental level. There is no pulmonary embolus. The main pulmonary artery is within normal limits for size. There is no CT evidence of acute right heart strain. The visualized aorta is normal. Heart size is mildly enlarged. Again noted is a small to moderate-sized pericardial effusion with some redistribution in fluid but likely overall slight interval increase in size. Mediastinum/Nodes: --No mediastinal or hilar lymphadenopathy. --No axillary lymphadenopathy. --No supraclavicular lymphadenopathy. --Normal thyroid gland. --The esophagus is unremarkable Lungs/Pleura: There are trace bilateral pleural effusions, right greater than left. There is no pneumothorax. No large pleural  effusion. There is no focal infiltrate. Upper Abdomen: No acute abnormality. Musculoskeletal: No chest wall abnormality. No acute or significant osseous findings. Review of the MIP images confirms the above findings. IMPRESSION: 1. No evidence for acute pulmonary embolism. 2. Small to moderate-sized pericardial effusion with some redistribution in fluid but likely overall slight interval increase in size. 3. Trace bilateral pleural effusions, right greater than left. Electronically Signed   By: Katherine Mantle M.D.   On: 12/31/2019 00:54   DG Chest Port 1 View  Result Date: 12/30/2019 CLINICAL DATA:  Shortness of breath. History of pericardial effusion. EXAM: PORTABLE CHEST 1 VIEW COMPARISON:  02/18/2019 FINDINGS: Allowing for the portable AP technique, heart size is the same as was seen 1 year ago. Mediastinal shadows are otherwise normal. The lungs are clear. IMPRESSION: No active disease. Heart size unchanged when compared to last year allowing for the portable AP technique. At that time, the patient did have a moderate pericardial effusion. Electronically Signed   By: Paulina Fusi M.D.   On: 12/30/2019 23:39    Pending Labs Unresulted Labs (From admission, onward)    Start     Ordered   01/07/20 0500  Creatinine, serum  (enoxaparin (LOVENOX)    CrCl >/= 30 ml/min)  Weekly,   STAT    Comments: while on enoxaparin therapy    12/31/19 0237   12/31/19 0235  HIV Antibody (routine testing w rflx)  (HIV Antibody (Routine testing w reflex) panel)  Once,   STAT     12/31/19 0237   12/31/19 0235  CBC  (enoxaparin (LOVENOX)  CrCl >/= 30 ml/min)  Once,   STAT    Comments: Baseline for enoxaparin therapy IF NOT ALREADY DRAWN.  Notify MD if PLT < 100 K.    12/31/19 0237   12/31/19 0230  Sedimentation rate  ONCE - STAT,   STAT     12/31/19 0229          Vitals/Pain Today's Vitals   12/31/19 0200 12/31/19 0215 12/31/19 0230 12/31/19 0245  BP: (!) 110/45 (!) 106/41 (!) 104/47 (!) 105/53  Pulse:  68 68 69 69  Resp: (!) 23 16 17 16   Temp:      TempSrc:      SpO2: 100% 100% 100% 100%  Weight:      Height:      PainSc:        Isolation Precautions No active isolations  Medications Medications  enoxaparin (LOVENOX) injection 40 mg (has no administration in time range)  ondansetron (ZOFRAN) tablet 4 mg (has no administration in time range)    Or  ondansetron (ZOFRAN) injection 4 mg (has no administration in time range)  acetaminophen (TYLENOL) tablet 650 mg (has no administration in time range)    Or  acetaminophen (TYLENOL) suppository 650 mg (has no administration in time range)  LORazepam (ATIVAN) tablet 0.5 mg (has no administration in time range)  LORazepam (ATIVAN) injection 1 mg (1 mg Intravenous Given 12/30/19 2349)  iohexol (OMNIPAQUE) 350 MG/ML injection 75 mL (75 mLs Intravenous Contrast Given 12/31/19 0035)    Mobility walks Low fall risk   Focused Assessments Cardiac Assessment Handoff:    Lab Results  Component Value Date   TROPONINI <0.03 02/18/2019   No results found for: DDIMER Does the Patient currently have chest pain? No     R Recommendations: See Admitting Provider Note  Report given to:  Dawn RN on 2C  Additional Notes:

## 2019-12-31 NOTE — Progress Notes (Signed)
PROGRESS NOTE    Katie Hays  XJD:[id number] DOB: [birth year] DOA: 12/30/2019 PCP: Alba Cory, MD    Brief Narrative:  SHELLEY PULS is a 39 y.o. female with medical history significant for history of mitral valve prolapse and history of trace pericardial effusion on echocardiogram from 2018, history of a hospitalization in March 2020 for pneumonia, who presents to the emergency room in respiratory distress and tachypneic with respirations up to 40 but maintaining sats at 100% on room air.  On arrival in the emergency room she was afebrile with blood pressure 119/84, heart rate 84 respirations 35 with O2 sat 100% on room air.  Venous blood gas showed pH of 7.62, venous PCO2 less than 19 and venous PO2 102.  Her blood work was mostly unremarkable.  Hemoglobin 11.6.  D-dimer was 432.  Flu and Covid test negative.  TSH normal at 1.86.  BNP was 21.   She had a CT chest that showed no evidence of acute PE but did show small to moderate-sized pericardial effusion with some redistribution and fluid but likely overall slight interval increase in size.  Trace bilateral effusions right greater than left.  She received Ativan in the emergency room with significant improvement in her symptoms.  Hospitalist consulted for evaluation of cardiac etiology.     Consultants:   none  Procedures: echo  Antimicrobials:   none   Subjective: Pt feels better.  Denies shortness of breath, dizziness, chest pain, or any other symptoms  Objective: Vitals:   12/31/19 0345 12/31/19 0413 12/31/19 1230 12/31/19 1433  BP: (!) 110/51 (!) 106/58 (!) 100/54 (!) 102/58  Pulse: 66 73 73 77  Resp: 16 (!) 22 19 20   Temp:  98.3 F (36.8 C) 98.5 F (36.9 C) 98.7 F (37.1 C)  TempSrc:  Oral Oral Oral  SpO2: 100% 100% 99% 100%  Weight:      Height:        Intake/Output Summary (Last 24 hours) at 12/31/2019 1711 Last data filed at 12/31/2019 1435 Gross per 24 hour  Intake 480 ml  Output 700 ml  Net -220 ml    Filed Weights   12/30/19 2300  Weight: 77.1 kg    Examination:  General exam: Appears calm and comfortable, NAD Respiratory system: Clear to auscultation. Respiratory effort normal. Cardiovascular system: S1 & S2 heard, RRR. 2/6 soft SM.No  rubs, gallops or clicks. Gastrointestinal system: Abdomen is nondistended, soft and nontender. Normal bowel sounds heard. Central nervous system: Alert and oriented. No focal neurological deficits. Extremities: no edema Skin: Warm dry Psychiatry: Judgement and insight appear normal. Mood & affect appropriate.     Data Reviewed: I have personally reviewed following labs and imaging studies  CBC: Recent Labs  Lab 12/30/19 2314 12/31/19 0346  WBC 6.5 5.6  NEUTROABS 3.1  --   HGB 11.6* 10.7*  HCT 35.8* 33.6*  MCV 83.3 83.4  PLT 295 271   Basic Metabolic Panel: Recent Labs  Lab 12/30/19 2314  NA 138  K 3.6  CL 108  CO2 20*  GLUCOSE 94  BUN 9  CREATININE 0.82  CALCIUM 9.5   GFR: Estimated Creatinine Clearance: 97.6 mL/min (by C-G formula based on SCr of 0.82 mg/dL). Liver Function Tests: Recent Labs  Lab 12/30/19 2314  AST 22  ALT 19  ALKPHOS 55  BILITOT 0.5  PROT 7.9  ALBUMIN 4.0   No results for input(s): LIPASE, AMYLASE in the last 168 hours. No results for input(s): AMMONIA in the  last 168 hours. Coagulation Profile: No results for input(s): INR, PROTIME in the last 168 hours. Cardiac Enzymes: No results for input(s): CKTOTAL, CKMB, CKMBINDEX, TROPONINI in the last 168 hours. BNP (last 3 results) No results for input(s): PROBNP in the last 8760 hours. HbA1C: No results for input(s): HGBA1C in the last 72 hours. CBG: No results for input(s): GLUCAP in the last 168 hours. Lipid Profile: No results for input(s): CHOL, HDL, LDLCALC, TRIG, CHOLHDL, LDLDIRECT in the last 72 hours. Thyroid Function Tests: Recent Labs    12/30/19 2314  TSH 1.864   Anemia Panel: No results for input(s): VITAMINB12, FOLATE,  FERRITIN, TIBC, IRON, RETICCTPCT in the last 72 hours. Sepsis Labs: No results for input(s): PROCALCITON, LATICACIDVEN in the last 168 hours.  Recent Results (from the past 240 hour(s))  Respiratory Panel by RT PCR (Flu A&B, Covid) - Nasopharyngeal Swab     Status: None   Collection Time: 12/30/19 11:24 PM   Specimen: Nasopharyngeal Swab  Result Value Ref Range Status   SARS Coronavirus 2 by RT PCR NEGATIVE NEGATIVE Final    Comment: (NOTE) SARS-CoV-2 target nucleic acids are NOT DETECTED. The SARS-CoV-2 RNA is generally detectable in upper respiratoy specimens during the acute phase of infection. The lowest concentration of SARS-CoV-2 viral copies this assay can detect is 131 copies/mL. A negative result does not preclude SARS-Cov-2 infection and should not be used as the sole basis for treatment or other patient management decisions. A negative result may occur with  improper specimen collection/handling, submission of specimen other than nasopharyngeal swab, presence of viral mutation(s) within the areas targeted by this assay, and inadequate number of viral copies (<131 copies/mL). A negative result must be combined with clinical observations, patient history, and epidemiological information. The expected result is Negative. Fact Sheet for Patients:  https://www.moore.com/ Fact Sheet for Healthcare Providers:  https://www.young.biz/ This test is not yet ap proved or cleared by the [place] FDA and  has been authorized for detection and/or diagnosis of SARS-CoV-2 by FDA under an Emergency Use Authorization (EUA). This EUA will remain  in effect (meaning this test can be used) for the duration of the COVID-19 declaration under Section 564(b)(1) of the Act, 21 U.S.C. section 360bbb-3(b)(1), unless the authorization is terminated or revoked sooner.    Influenza A by PCR NEGATIVE NEGATIVE Final   Influenza B by PCR NEGATIVE NEGATIVE Final     Comment: (NOTE) The Xpert Xpress SARS-CoV-2/FLU/RSV assay is intended as an aid in  the diagnosis of influenza from Nasopharyngeal swab specimens and  should not be used as a sole basis for treatment. Nasal washings and  aspirates are unacceptable for Xpert Xpress SARS-CoV-2/FLU/RSV  testing. Fact Sheet for Patients: https://www.moore.com/ Fact Sheet for Healthcare Providers: https://www.young.biz/ This test is not yet approved or cleared by the [place] FDA and  has been authorized for detection and/or diagnosis of SARS-CoV-2 by  FDA under an Emergency Use Authorization (EUA). This EUA will remain  in effect (meaning this test can be used) for the duration of the  Covid-19 declaration under Section 564(b)(1) of the Act, 21  U.S.C. section 360bbb-3(b)(1), unless the authorization is  terminated or revoked. Performed at [facility], [address] [place], [place] [address]          Radiology Studies: CT Angio Chest PE W and/or Wo Contrast  Result Date: 12/31/2019 CLINICAL DATA:  Shortness of breath. History of pericardial effusion. EXAM: CT ANGIOGRAPHY CHEST WITH CONTRAST TECHNIQUE: Multidetector CT imaging of  the chest was performed using the standard protocol during bolus administration of intravenous contrast. Multiplanar CT image reconstructions and MIPs were obtained to evaluate the vascular anatomy. CONTRAST:  9mL OMNIPAQUE IOHEXOL 350 MG/ML SOLN COMPARISON:  CT dated February 15, 2019. FINDINGS: Cardiovascular: Contrast injection is sufficient to demonstrate satisfactory opacification of the pulmonary arteries to the segmental level. There is no pulmonary embolus. The main pulmonary artery is within normal limits for size. There is no CT evidence of acute right heart strain. The visualized aorta is normal. Heart size is mildly enlarged. Again noted is a small to moderate-sized pericardial effusion with some redistribution in  fluid but likely overall slight interval increase in size. Mediastinum/Nodes: --No mediastinal or hilar lymphadenopathy. --No axillary lymphadenopathy. --No supraclavicular lymphadenopathy. --Normal thyroid gland. --The esophagus is unremarkable Lungs/Pleura: There are trace bilateral pleural effusions, right greater than left. There is no pneumothorax. No large pleural effusion. There is no focal infiltrate. Upper Abdomen: No acute abnormality. Musculoskeletal: No chest wall abnormality. No acute or significant osseous findings. Review of the MIP images confirms the above findings. IMPRESSION: 1. No evidence for acute pulmonary embolism. 2. Small to moderate-sized pericardial effusion with some redistribution in fluid but likely overall slight interval increase in size. 3. Trace bilateral pleural effusions, right greater than left. Electronically Signed   By: Katherine Mantle M.D.   On: 12/31/2019 00:54   DG Chest Port 1 View  Result Date: 12/30/2019 CLINICAL DATA:  Shortness of breath. History of pericardial effusion. EXAM: PORTABLE CHEST 1 VIEW COMPARISON:  02/18/2019 FINDINGS: Allowing for the portable AP technique, heart size is the same as was seen 1 year ago. Mediastinal shadows are otherwise normal. The lungs are clear. IMPRESSION: No active disease. Heart size unchanged when compared to last year allowing for the portable AP technique. At that time, the patient did have a moderate pericardial effusion. Electronically Signed   By: Paulina Fusi M.D.   On: 12/30/2019 23:39   ECHOCARDIOGRAM COMPLETE  Result Date: 12/31/2019   ECHOCARDIOGRAM REPORT   Patient Name:   CLOA BUSHONG Aday Date of Exam: 12/31/2019 Medical Rec #:  [id number]        Height:       67.0 in Accession #:    [id number]       Weight:       170.0 lb Date of Birth:  26-Sep-1981        BSA:          1.89 m Patient Age:    38 years         BP:           106/58 mmHg Patient Gender: F                HR:           72 bpm. Exam Location:  [facility]  Procedure: 2D Echo Indications:     Pericardial Effusion 423.9/ I31.3  History:         Patient has no prior history of Echocardiogram examinations.  Sonographer:     Wonda Cerise Referring Phys:  [id number] Andris Baumann Diagnosing Phys: Harold Hedge MD IMPRESSIONS  1. Left ventricular ejection fraction, by visual estimation, is 60 to 65%. The left ventricle has normal function. Left ventricular septal wall thickness was normal. Normal left ventricular posterior wall thickness. There is no left ventricular hypertrophy.  2. The left ventricle has no regional wall motion abnormalities.  3. Global right ventricle has normal systolic function.The right  ventricular size is normal. No increase in right ventricular wall thickness.  4. Left atrial size was normal.  5. Right atrial size was normal.  6. Small pericardial effusion.  7. The mitral valve is grossly normal. Trivial mitral valve regurgitation.  8. The tricuspid valve is normal in structure.  9. The tricuspid valve is normal in structure. Tricuspid valve regurgitation is trivial. 10. The aortic valve is tricuspid. Aortic valve regurgitation is not visualized. 11. The pulmonic valve was not well visualized. Pulmonic valve regurgitation is trivial. FINDINGS  Left Ventricle: Left ventricular ejection fraction, by visual estimation, is 60 to 65%. The left ventricle has normal function. The left ventricle has no regional wall motion abnormalities. Normal left ventricular posterior wall thickness. There is no left ventricular hypertrophy. Left ventricular diastolic parameters were normal. Right Ventricle: The right ventricular size is normal. No increase in right ventricular wall thickness. Global RV systolic function is has normal systolic function. Left Atrium: Left atrial size was normal in size. Right Atrium: Right atrial size was normal in size Pericardium: A small pericardial effusion is present. There is no evidence of cardiac tamponade. Mitral Valve: The mitral  valve is grossly normal. Trivial mitral valve regurgitation. Tricuspid Valve: The tricuspid valve is normal in structure. Tricuspid valve regurgitation is trivial. Aortic Valve: The aortic valve is tricuspid. Aortic valve regurgitation is not visualized. Aortic valve mean gradient measures 2.0 mmHg. Aortic valve peak gradient measures 4.5 mmHg. Aortic valve area, by VTI measures 2.82 cm. Pulmonic Valve: The pulmonic valve was not well visualized. Pulmonic valve regurgitation is trivial. Pulmonic regurgitation is trivial. Aorta: The aortic root is normal in size and structure. IAS/Shunts: No atrial level shunt detected by color flow Doppler.  LEFT VENTRICLE PLAX 2D LVIDd:         4.63 cm  Diastology LVIDs:         3.10 cm  LV e' lateral:   13.10 cm/s LV PW:         0.91 cm  LV E/e' lateral: 5.3 LV IVS:        0.89 cm  LV e' medial:    10.20 cm/s LVOT diam:     2.10 cm  LV E/e' medial:  6.8 LV SV:         61 ml LV SV Index:   31.66 LVOT Area:     3.46 cm  RIGHT VENTRICLE RV Basal diam:  3.11 cm RV S prime:     19.00 cm/s TAPSE (M-mode): 2.6 cm LEFT ATRIUM             Index       RIGHT ATRIUM           Index LA diam:        2.60 cm 1.38 cm/m  RA Area:     15.10 cm LA Vol (A2C):   14.8 ml 7.84 ml/m  RA Volume:   38.20 ml  20.24 ml/m LA Vol (A4C):   30.6 ml 16.21 ml/m LA Biplane Vol: 22.7 ml 12.03 ml/m  AORTIC VALVE                   PULMONIC VALVE AV Area (Vmax):    2.92 cm    PV Vmax:       0.84 m/s AV Area (Vmean):   3.02 cm    PV Peak grad:  2.8 mmHg AV Area (VTI):     2.82 cm AV Vmax:  106.00 cm/s AV Vmean:          61.800 cm/s AV VTI:            0.215 m AV Peak Grad:      4.5 mmHg AV Mean Grad:      2.0 mmHg LVOT Vmax:         89.40 cm/s LVOT Vmean:        53.800 cm/s LVOT VTI:          0.175 m LVOT/AV VTI ratio: 0.81  AORTA Ao Root diam: 3.20 cm Ao Asc diam:  3.00 cm MITRAL VALVE                        TRICUSPID VALVE MV Area (PHT): 3.77 cm             TV Peak grad:   19.0 mmHg MV PHT:         58.29 msec           TV Vmax:        2.18 m/s MV Decel Time: 201 msec MV E velocity: 68.90 cm/s 103 cm/s  SHUNTS MV A velocity: 61.60 cm/s 70.3 cm/s Systemic VTI:  0.18 m MV E/A ratio:  1.12       1.5       Systemic Diam: 2.10 cm  Bartholome Bill MD Electronically signed by Bartholome Bill MD Signature Date/Time: 12/31/2019/12:18:12 PM    Final         Scheduled Meds: . enoxaparin (LOVENOX) injection  40 mg Subcutaneous Q24H   Continuous Infusions:  Assessment & Plan:   Principal Problem:   Acute dyspnea Active Problems:   MVP (mitral valve prolapse)   Pericardial effusion   1.Acute dyspnea-likely anxiety attack since he improved with Ativan. Ativan prn for now May need to f/u with pcp for further management as outpt   2.Pericardial effusion-she reports having this since 2016.  She follows [facility] cardiology. Echo pending Cardiology Dr. Ubaldo Glassing consulted, will f/u  3.hx/o MVP- echo pending.   DVT prophylaxis: lovenox Code Status: Full Family Communication: None at bedside Disposition Plan: Possible DC in a.m. if echocardiogram stable and cardiology consult is still pending.       LOS: 0 days   Time spent: 45 minutes with more than 50% COC    Nolberto Hanlon, MD [facility] Pager [phone]  If 7PM-7AM, please contact night-coverage [email or web address] Password [email or web address] 12/31/2019, 5:11 PM

## 2020-01-01 DIAGNOSIS — I313 Pericardial effusion (noninflammatory): Secondary | ICD-10-CM | POA: Diagnosis not present

## 2020-01-01 DIAGNOSIS — R0600 Dyspnea, unspecified: Secondary | ICD-10-CM | POA: Diagnosis not present

## 2020-01-01 LAB — BASIC METABOLIC PANEL
Anion gap: 9 (ref 5–15)
BUN: 10 mg/dL (ref 6–20)
CO2: 21 mmol/L — ABNORMAL LOW (ref 22–32)
Calcium: 9 mg/dL (ref 8.9–10.3)
Chloride: 107 mmol/L (ref 98–111)
Creatinine, Ser: 0.59 mg/dL (ref 0.44–1.00)
GFR calc Af Amer: >60 mL/min (ref >60)
GFR calc non Af Amer: >60 mL/min (ref >60)
Glucose, Bld: 92 mg/dL (ref 70–99)
Potassium: 3.6 mmol/L (ref 3.5–5.1)
Sodium: 137 mmol/L (ref 135–145)

## 2020-01-01 NOTE — Consult Note (Signed)
Cardiology Consultation Note    Patient ID: Katie Hays, MRN: [id number], DOB/AGE: 1981-07-05 39 y.o. Admit date: 12/30/2019   Date of Consult: 01/01/2020 Primary Physician: Alba Cory, MD Primary Cardiologist: Dr. Juliann Pares  Chief Complaint: sob Reason for Consultation: pericardial effusion Requesting MD: Dr. Marylu Lund  HPI: Katie Hays is a 39 y.o. female with history of a chronic small pericardial effusion, anemia and a history of upper respiratory symptoms recently B.  She is had "cold symptoms" for a little over a week which has worsened to where she has a raspy voice.  During that time she became a little bit more short of breath and presented to the emergency room.  BNP was normal at 21.  Renal function was normal.  Hemoglobin was 11.6 up from 10.13 weeks ago.  Her white blood cell count was 6.5.  She was Covid negative.  Serum troponins were drawn per protocol.  She had no real chest pain chest chest tightness.  They were normal.  EKG showed normal sinus rhythm.  Chest x-ray showed no active disease with some cardiomegaly.  Chest CT revealed no pulmonary embolus but was felt to show a moderate pericardial effusion.  This was present previously on a chest CT approximately 1 year ago.  Echocardiogram revealed LV function was normal with no significant valvular abnormalities.  There was a small pericardial effusion with no evidence of tamponade physiology.  She feels somewhat better this morning.  She still complains of upper respiratory symptoms as well as difficulty speaking clearly due to hoarse voice.  Past Medical History:  Diagnosis Date  . Anemia   . History of meningitis   . Pericardial effusion       Surgical History:  Past Surgical History:  Procedure Laterality Date  . CESAREAN SECTION  04/30/2016   04/16/2012  . TUBAL LIGATION Bilateral 04/30/2016     Home Meds: Prior to Admission medications   Medication Sig Start Date End Date Taking? Authorizing Provider   cetirizine (ZYRTEC) 10 MG tablet Take 10 mg by mouth daily.   Yes [provider]  ferrous sulfate 325 (65 FE) MG tablet Take 1 tablet (325 mg total) by mouth 3 (three) times daily with meals. Take colace if needed 12/12/19  Yes Sowles, Danna Hefty, MD  Multiple Vitamins-Minerals (MULTIVITAMIN GUMMIES WOMENS PO) Take 2 each by mouth daily. VitaFusion Organic   Yes [provider]    Inpatient Medications:  . enoxaparin (LOVENOX) injection  40 mg Subcutaneous Q24H     Allergies:  Allergies  Allergen Reactions  . Dilaudid [Hydromorphone Hcl] Swelling    Social History   Socioeconomic History  . Marital status: Married    Spouse name: Katie Hays  . Number of children: 2  . Years of education: Not on file  . Highest education level: Bachelor's degree (e.g., BA, AB, BS)  Occupational History  . Occupation: real state appraisal  Tobacco Use  . Smoking status: Never Smoker  . Smokeless tobacco: Never Used  Substance and Sexual Activity  . Alcohol use: Never    Comment: never  . Drug use: No  . Sexual activity: Yes    Partners: Male    Birth control/protection: Other-see comments    Comment: Tubal Ligation  Other Topics Concern  . 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 Difficulty of Paying Living Expenses: Not hard at all  Food Insecurity: No Food Insecurity  . Worried About Programme researcher, broadcasting/film/video in the Last Year: Never true  . Ran Out of Food in the Last Year: Never true  Transportation Needs: No Transportation Needs  . Lack of Transportation (Medical): No  . Lack of Transportation (Non-Medical): No  Physical Activity: Insufficiently Active  . Days of Exercise per Week: 2 days  . Minutes of Exercise per Session: 50 min  Stress: No Stress Concern Present  . Feeling of Stress : Not at all   Social Connections: Not Isolated  . Frequency of Communication with Friends and Family: More than three times a week  . Frequency of Social Gatherings with Friends and Family: More than three times a week  . Attends Religious Services: More than 4 times per year  . Active Member of Clubs or Organizations: Yes  . Attends Banker Meetings: More than 4 times per year  . Marital Status: Married  Catering manager Violence: Not At Risk  . Fear of Current or Ex-Partner: No  . Emotionally Abused: No  . Physically Abused: No  . Sexually Abused: No     Family History  Problem Relation Age of Onset  . Arthritis Mother   . Heart disease Father   . Diabetes Father   . Hypertension Father   . Asthma Sister   . Hypertension Sister   . Diabetes Sister   . Asthma Brother   . Heart attack Brother   . Asthma Sister   . Hypertension Sister   . Heart attack Sister      Review of Systems: A 12-system review of systems was performed and is negative except as noted in the HPI.  Labs: No results for input(s): CKTOTAL, CKMB, TROPONINI in the last 72 hours. Lab Results  Component Value Date   WBC 5.6 12/31/2019   HGB 10.7 (L) 12/31/2019   HCT 33.6 (L) 12/31/2019   MCV 83.4 12/31/2019   PLT 271 12/31/2019    Recent Labs  Lab 12/30/19 2314 12/30/19 2314 01/01/20 0633  NA 138   < > 137  K 3.6   < > 3.6  CL 108   < > 107  CO2 20*   < > 21*  BUN 9   < > 10  CREATININE 0.82   < > 0.59  CALCIUM 9.5   < > 9.0  PROT 7.9  --   --   BILITOT 0.5  --   --   ALKPHOS 55  --   --   ALT 19  --   --   AST 22  --   --   GLUCOSE 94   < > 92   < > = values in this interval not displayed.   Lab Results  Component Value Date   CHOL 164 12/07/2019   HDL 55 12/07/2019   LDLCALC 96 12/07/2019   TRIG 45 12/07/2019   No results found for: DDIMER  Radiology/Studies:  CT Angio Chest PE W and/or Wo Contrast  Result Date: 12/31/2019 CLINICAL DATA:  Shortness of breath. History of  pericardial effusion. EXAM: CT ANGIOGRAPHY CHEST WITH CONTRAST TECHNIQUE: Multidetector CT imaging of the chest was performed using the standard protocol during bolus administration of intravenous contrast. Multiplanar CT image reconstructions and MIPs were obtained to evaluate the vascular anatomy. CONTRAST:  76mL OMNIPAQUE IOHEXOL 350 MG/ML SOLN COMPARISON:  CT dated  February 15, 2019. FINDINGS: Cardiovascular: Contrast injection is sufficient to demonstrate satisfactory opacification of the pulmonary arteries to the segmental level. There is no pulmonary embolus. The main pulmonary artery is within normal limits for size. There is no CT evidence of acute right heart strain. The visualized aorta is normal. Heart size is mildly enlarged. Again noted is a small to moderate-sized pericardial effusion with some redistribution in fluid but likely overall slight interval increase in size. Mediastinum/Nodes: --No mediastinal or hilar lymphadenopathy. --No axillary lymphadenopathy. --No supraclavicular lymphadenopathy. --Normal thyroid gland. --The esophagus is unremarkable Lungs/Pleura: There are trace bilateral pleural effusions, right greater than left. There is no pneumothorax. No large pleural effusion. There is no focal infiltrate. Upper Abdomen: No acute abnormality. Musculoskeletal: No chest wall abnormality. No acute or significant osseous findings. Review of the MIP images confirms the above findings. IMPRESSION: 1. No evidence for acute pulmonary embolism. 2. Small to moderate-sized pericardial effusion with some redistribution in fluid but likely overall slight interval increase in size. 3. Trace bilateral pleural effusions, right greater than left. Electronically Signed   By: Constance Holster M.D.   On: 12/31/2019 00:54   DG Chest Port 1 View  Result Date: 12/30/2019 CLINICAL DATA:  Shortness of breath. History of pericardial effusion. EXAM: PORTABLE CHEST 1 VIEW COMPARISON:  02/18/2019 FINDINGS: Allowing  for the portable AP technique, heart size is the same as was seen 1 year ago. Mediastinal shadows are otherwise normal. The lungs are clear. IMPRESSION: No active disease. Heart size unchanged when compared to last year allowing for the portable AP technique. At that time, the patient did have a moderate pericardial effusion. Electronically Signed   By: Nelson Chimes M.D.   On: 12/30/2019 23:39   ECHOCARDIOGRAM COMPLETE  Result Date: 12/31/2019   ECHOCARDIOGRAM REPORT   Patient Name:   Katie Hays Litle Date of Exam: 12/31/2019 Medical Rec #:  [id number]        Height:       67.0 in Accession #:    [id number]       Weight:       170.0 lb Date of Birth:  [birth year]        BSA:          1.89 m Patient Age:    38 years         BP:           106/58 mmHg Patient Gender: F                HR:           72 bpm. Exam Location:  [facility] Procedure: 2D Echo Indications:     Pericardial Effusion 423.9/ I31.3  History:         Patient has no prior history of Echocardiogram examinations.  Sonographer:     Arville Go Referring Phys:  [id number] Athena Masse Diagnosing Phys: Bartholome Bill MD IMPRESSIONS  1. Left ventricular ejection fraction, by visual estimation, is 60 to 65%. The left ventricle has normal function. Left ventricular septal wall thickness was normal. Normal left ventricular posterior wall thickness. There is no left ventricular hypertrophy.  2. The left ventricle has no regional wall motion abnormalities.  3. Global right ventricle has normal systolic function.The right ventricular size is normal. No increase in right ventricular wall thickness.  4. Left atrial size was normal.  5. Right atrial size was normal.  6. Small pericardial effusion.  7. The mitral valve is grossly normal. Trivial mitral  valve regurgitation.  8. The tricuspid valve is normal in structure.  9. The tricuspid valve is normal in structure. Tricuspid valve regurgitation is trivial. 10. The aortic valve is tricuspid. Aortic valve regurgitation is  not visualized. 11. The pulmonic valve was not well visualized. Pulmonic valve regurgitation is trivial. FINDINGS  Left Ventricle: Left ventricular ejection fraction, by visual estimation, is 60 to 65%. The left ventricle has normal function. The left ventricle has no regional wall motion abnormalities. Normal left ventricular posterior wall thickness. There is no left ventricular hypertrophy. Left ventricular diastolic parameters were normal. Right Ventricle: The right ventricular size is normal. No increase in right ventricular wall thickness. Global RV systolic function is has normal systolic function. Left Atrium: Left atrial size was normal in size. Right Atrium: Right atrial size was normal in size Pericardium: A small pericardial effusion is present. There is no evidence of cardiac tamponade. Mitral Valve: The mitral valve is grossly normal. Trivial mitral valve regurgitation. Tricuspid Valve: The tricuspid valve is normal in structure. Tricuspid valve regurgitation is trivial. Aortic Valve: The aortic valve is tricuspid. Aortic valve regurgitation is not visualized. Aortic valve mean gradient measures 2.0 mmHg. Aortic valve peak gradient measures 4.5 mmHg. Aortic valve area, by VTI measures 2.82 cm. Pulmonic Valve: The pulmonic valve was not well visualized. Pulmonic valve regurgitation is trivial. Pulmonic regurgitation is trivial. Aorta: The aortic root is normal in size and structure. IAS/Shunts: No atrial level shunt detected by color flow Doppler.  LEFT VENTRICLE PLAX 2D LVIDd:         4.63 cm  Diastology LVIDs:         3.10 cm  LV e' lateral:   13.10 cm/s LV PW:         0.91 cm  LV E/e' lateral: 5.3 LV IVS:        0.89 cm  LV e' medial:    10.20 cm/s LVOT diam:     2.10 cm  LV E/e' medial:  6.8 LV SV:         61 ml LV SV Index:   31.66 LVOT Area:     3.46 cm  RIGHT VENTRICLE RV Basal diam:  3.11 cm RV S prime:     19.00 cm/s TAPSE (M-mode): 2.6 cm LEFT ATRIUM             Index       RIGHT ATRIUM            Index LA diam:        2.60 cm 1.38 cm/m  RA Area:     15.10 cm LA Vol (A2C):   14.8 ml 7.84 ml/m  RA Volume:   38.20 ml  20.24 ml/m LA Vol (A4C):   30.6 ml 16.21 ml/m LA Biplane Vol: 22.7 ml 12.03 ml/m  AORTIC VALVE                   PULMONIC VALVE AV Area (Vmax):    2.92 cm    PV Vmax:       0.84 m/s AV Area (Vmean):   3.02 cm    PV Peak grad:  2.8 mmHg AV Area (VTI):     2.82 cm AV Vmax:           106.00 cm/s AV Vmean:          61.800 cm/s AV VTI:            0.215 m AV Peak Grad:  4.5 mmHg AV Mean Grad:      2.0 mmHg LVOT Vmax:         89.40 cm/s LVOT Vmean:        53.800 cm/s LVOT VTI:          0.175 m LVOT/AV VTI ratio: 0.81  AORTA Ao Root diam: 3.20 cm Ao Asc diam:  3.00 cm MITRAL VALVE                        TRICUSPID VALVE MV Area (PHT): 3.77 cm             TV Peak grad:   19.0 mmHg MV PHT:        58.29 msec           TV Vmax:        2.18 m/s MV Decel Time: 201 msec MV E velocity: 68.90 cm/s 103 cm/s  SHUNTS MV A velocity: 61.60 cm/s 70.3 cm/s Systemic VTI:  0.18 m MV E/A ratio:  1.12       1.5       Systemic Diam: 2.10 cm  Harold Hedge MD Electronically signed by Harold Hedge MD Signature Date/Time: 12/31/2019/12:18:12 PM    Final     Wt Readings from Last 3 Encounters:  12/30/19 77.1 kg  12/06/19 77.8 kg  02/19/19 74.4 kg    EKG: Normal sinus rhythm  Physical Exam: American female no acute distress Blood pressure (!) 99/51, pulse 60, temperature 98.3 F (36.8 C), temperature source Oral, resp. rate 18, height 5\' 7"  (1.702 m), weight 77.1 kg, last menstrual period 12/21/2019, SpO2 100 %. Body mass index is 26.63 kg/m. General: Well developed, well nourished, in no acute distress. Head: Normocephalic, atraumatic, sclera non-icteric, no xanthomas, nares are without discharge.  Neck: Negative for carotid bruits. JVD not elevated. Lungs: Clear bilaterally to auscultation without wheezes, rales, or rhonchi. Breathing is unlabored.  Hoarse voice Heart: RRR with S1 S2. No  murmurs, rubs, or gallops appreciated. Abdomen: Soft, non-tender, non-distended with normoactive bowel sounds. No hepatomegaly. No rebound/guarding. No obvious abdominal masses. Msk:  Strength and tone appear normal for age. Extremities: No clubbing or cyanosis. No edema.  Distal pedal pulses are 2+ and equal bilaterally. Neuro: Alert and oriented X 3. No facial asymmetry. No focal deficit. Moves all extremities spontaneously. Psych:  Responds to questions appropriately with a normal affect.     Assessment and Plan  39 year old female with history of pericardial effusion noted 1 year ago and chest CTA.  Presented with upper respiratory symptoms and dyspnea.  Chest CT again showed no pulmonary embolus but pericardial effusion.  Echocardiogram reveals small pericardial effusion with no tamponade physiology.  She is hemodynamically stable.  She does still complain of a hoarse voice.  At this point from a cardiac standpoint I feel she is ready for discharge with outpatient follow-up.  I have scheduled outpatient follow-up next week with Dr. [id number] at the [place] [facility].  She does not appear to need any cardiac medications at this time.  She is hemodynamically stable.  Signed, [place] MD 01/01/2020, 8:40 AM Pager: [phone]

## 2020-01-01 NOTE — Progress Notes (Signed)
Katie Hays  A and O x 4. VSS. Pt tolerating diet well. No complaints of pain or nausea. IV removed intact, prescriptions given. Pt voiced understanding of discharge instructions with no further questions. Pt discharged via wheelchair with NT.    Allergies as of 01/01/2020      Reactions   Dilaudid [hydromorphone Hcl] Swelling      Medication List    TAKE these medications   cetirizine 10 MG tablet Commonly known as: ZYRTEC Take 10 mg by mouth daily.   ferrous sulfate 325 (65 FE) MG tablet Take 1 tablet (325 mg total) by mouth 3 (three) times daily with meals. Take colace if needed   MULTIVITAMIN GUMMIES WOMENS PO Take 2 each by mouth daily. VitaFusion Organic       Vitals:   01/01/20 0942 01/01/20 0955  BP: (!) 88/60 (!) 99/47  Pulse: 86   Resp:    Temp: 98.3 F (36.8 C)   SpO2: 100%     Suzzanne Cloud

## 2020-01-01 NOTE — Discharge Summary (Signed)
Katie Hays VEH:[id number] DOB: [birth year] DOA: 12/30/2019  PCP: Alba Cory, MD  Admit date: 12/30/2019 Discharge date: 01/01/2020  Admitted From: Home Disposition: Home  Recommendations for Outpatient Follow-up:  1. Follow up with PCP in 1 week 2. Please obtain BMP/CBC in one week 3. Cardiology in 2 weeks  Home Health: None   Discharge Condition:Stable CODE STATUS: Full Diet recommendation: Heart Healthy / Carb Modified / Regular / Dysphagia  Brief/Interim Summary: Katie L Politeis a 38 y.o.femalewith medical history significant forhistory of mitral valve prolapse and history of trace pericardial effusion on echocardiogram from 2018,history of a hospitalization in March 2020 for pneumonia, who presents to the emergency room in respiratory distress and tachypneic with respirations up to 40 but maintaining sats at 100% on room air. On arrival in the emergency room she was afebrile with blood pressure 119/84, heart rate 84 respirations 35 with O2 sat 100% on room air.Venous blood gas showed pH of 7.62, venous PCO2 less than 19 and venous PO2 102. Her blood work was mostly unremarkable. Hemoglobin 11.6. D-dimer was 432. Flu and Covid test negative. TSH normal at 1.86. BNP was 21. She had a CT chest that showed no evidence of acute PE but did show small to moderate-sized pericardial effusion with some redistribution and fluid but likely overall slight interval increase in size. Trace bilateral effusions right greater than left.She received Ativan in the emergency room with significant improvement in her symptoms. Hospitalist consulted for evaluation of cardiac etiology.  Cardiology was consulted.  Patient underwent echocardiogram revealing EF with small pericardial effusion without evidence of tamponade.  Cardiology cleared patient for discharge no further cardiac work-up needed.  Part of her issue was anxiety which she needs to follow-up with her primary care for further  evaluation.  She is stable for discharge today.  SARS Covid test was negative  Discharge Diagnoses:  Principal Problem:   Acute dyspnea Active Problems:   MVP (mitral valve prolapse)   Pericardial effusion    Discharge Instructions  Discharge Instructions    Call MD for:  temperature >100.4   Complete by: As directed    Diet - low sodium heart healthy   Complete by: As directed    Discharge instructions   Complete by: As directed    Follow-up with PCP in 1 week Follow-up with cardiology in couple of weeks   Increase activity slowly   Complete by: As directed      Allergies as of 01/01/2020      Reactions   Dilaudid [hydromorphone Hcl] Swelling      Medication List    TAKE these medications   cetirizine 10 MG tablet Commonly known as: ZYRTEC Take 10 mg by mouth daily.   ferrous sulfate 325 (65 FE) MG tablet Take 1 tablet (325 mg total) by mouth 3 (three) times daily with meals. Take colace if needed   MULTIVITAMIN GUMMIES WOMENS PO Take 2 each by mouth daily. [employer]      Follow-up Information    Callwood, Dwayne D, MD Follow up in 1 week(s).   Specialties: Cardiology, Internal Medicine Contact information: [address] [place] [address] [phone]        Alba Cory, MD Follow up in 1 week(s).   Specialty: Family Medicine Contact information: [address] [place] [address] [phone]          Allergies  Allergen Reactions  . Dilaudid [Hydromorphone Hcl] Swelling    Consultations:  Cardiology   Procedures/Studies:  CT Angio Chest PE W and/or Wo Contrast  Result Date: 12/31/2019 CLINICAL DATA:  Shortness of breath. History of pericardial effusion. EXAM: CT ANGIOGRAPHY CHEST WITH CONTRAST TECHNIQUE: Multidetector CT imaging of the chest was performed using the standard protocol during bolus administration of intravenous contrast. Multiplanar CT image reconstructions and MIPs were obtained to  evaluate the vascular anatomy. CONTRAST:  9mL OMNIPAQUE IOHEXOL 350 MG/ML SOLN COMPARISON:  CT dated February 15, 2019. FINDINGS: Cardiovascular: Contrast injection is sufficient to demonstrate satisfactory opacification of the pulmonary arteries to the segmental level. There is no pulmonary embolus. The main pulmonary artery is within normal limits for size. There is no CT evidence of acute right heart strain. The visualized aorta is normal. Heart size is mildly enlarged. Again noted is a small to moderate-sized pericardial effusion with some redistribution in fluid but likely overall slight interval increase in size. Mediastinum/Nodes: --No mediastinal or hilar lymphadenopathy. --No axillary lymphadenopathy. --No supraclavicular lymphadenopathy. --Normal thyroid gland. --The esophagus is unremarkable Lungs/Pleura: There are trace bilateral pleural effusions, right greater than left. There is no pneumothorax. No large pleural effusion. There is no focal infiltrate. Upper Abdomen: No acute abnormality. Musculoskeletal: No chest wall abnormality. No acute or significant osseous findings. Review of the MIP images confirms the above findings. IMPRESSION: 1. No evidence for acute pulmonary embolism. 2. Small to moderate-sized pericardial effusion with some redistribution in fluid but likely overall slight interval increase in size. 3. Trace bilateral pleural effusions, right greater than left. Electronically Signed   By: Katherine Mantle M.D.   On: 12/31/2019 00:54   DG Chest Port 1 View  Result Date: 12/30/2019 CLINICAL DATA:  Shortness of breath. History of pericardial effusion. EXAM: PORTABLE CHEST 1 VIEW COMPARISON:  02/18/2019 FINDINGS: Allowing for the portable AP technique, heart size is the same as was seen 1 year ago. Mediastinal shadows are otherwise normal. The lungs are clear. IMPRESSION: No active disease. Heart size unchanged when compared to last year allowing for the portable AP technique. At that  time, the patient did have a moderate pericardial effusion. Electronically Signed   By: Paulina Fusi M.D.   On: 12/30/2019 23:39   ECHOCARDIOGRAM COMPLETE  Result Date: 12/31/2019   ECHOCARDIOGRAM REPORT   Patient Name:   Katie Hays Date of Exam: 12/31/2019 Medical Rec #:  [id number]        Height:       67.0 in Accession #:    [id number]       Weight:       170.0 lb Date of Birth:  [birth year]        BSA:          1.89 m Patient Age:    38 years         BP:           106/58 mmHg Patient Gender: F                HR:           72 bpm. Exam Location:  [facility] Procedure: 2D Echo Indications:     Pericardial Effusion 423.9/ I31.3  History:         Patient has no prior history of Echocardiogram examinations.  Sonographer:     Wonda Cerise Referring Phys:  [id number] Andris Baumann Diagnosing Phys: Harold Hedge MD IMPRESSIONS  1. Left ventricular ejection fraction, by visual estimation, is 60 to 65%. The left ventricle has normal function. Left ventricular septal wall thickness was normal.  Normal left ventricular posterior wall thickness. There is no left ventricular hypertrophy.  2. The left ventricle has no regional wall motion abnormalities.  3. Global right ventricle has normal systolic function.The right ventricular size is normal. No increase in right ventricular wall thickness.  4. Left atrial size was normal.  5. Right atrial size was normal.  6. Small pericardial effusion.  7. The mitral valve is grossly normal. Trivial mitral valve regurgitation.  8. The tricuspid valve is normal in structure.  9. The tricuspid valve is normal in structure. Tricuspid valve regurgitation is trivial. 10. The aortic valve is tricuspid. Aortic valve regurgitation is not visualized. 11. The pulmonic valve was not well visualized. Pulmonic valve regurgitation is trivial. FINDINGS  Left Ventricle: Left ventricular ejection fraction, by visual estimation, is 60 to 65%. The left ventricle has normal function. The left ventricle has no  regional wall motion abnormalities. Normal left ventricular posterior wall thickness. There is no left ventricular hypertrophy. Left ventricular diastolic parameters were normal. Right Ventricle: The right ventricular size is normal. No increase in right ventricular wall thickness. Global RV systolic function is has normal systolic function. Left Atrium: Left atrial size was normal in size. Right Atrium: Right atrial size was normal in size Pericardium: A small pericardial effusion is present. There is no evidence of cardiac tamponade. Mitral Valve: The mitral valve is grossly normal. Trivial mitral valve regurgitation. Tricuspid Valve: The tricuspid valve is normal in structure. Tricuspid valve regurgitation is trivial. Aortic Valve: The aortic valve is tricuspid. Aortic valve regurgitation is not visualized. Aortic valve mean gradient measures 2.0 mmHg. Aortic valve peak gradient measures 4.5 mmHg. Aortic valve area, by VTI measures 2.82 cm. Pulmonic Valve: The pulmonic valve was not well visualized. Pulmonic valve regurgitation is trivial. Pulmonic regurgitation is trivial. Aorta: The aortic root is normal in size and structure. IAS/Shunts: No atrial level shunt detected by color flow Doppler.  LEFT VENTRICLE PLAX 2D LVIDd:         4.63 cm  Diastology LVIDs:         3.10 cm  LV e' lateral:   13.10 cm/s LV PW:         0.91 cm  LV E/e' lateral: 5.3 LV IVS:        0.89 cm  LV e' medial:    10.20 cm/s LVOT diam:     2.10 cm  LV E/e' medial:  6.8 LV SV:         61 ml LV SV Index:   31.66 LVOT Area:     3.46 cm  RIGHT VENTRICLE RV Basal diam:  3.11 cm RV S prime:     19.00 cm/s TAPSE (M-mode): 2.6 cm LEFT ATRIUM             Index       RIGHT ATRIUM           Index LA diam:        2.60 cm 1.38 cm/m  RA Area:     15.10 cm LA Vol (A2C):   14.8 ml 7.84 ml/m  RA Volume:   38.20 ml  20.24 ml/m LA Vol (A4C):   30.6 ml 16.21 ml/m LA Biplane Vol: 22.7 ml 12.03 ml/m  AORTIC VALVE                   PULMONIC VALVE AV Area  (Vmax):    2.92 cm    PV Vmax:       0.84 m/s AV  Area (Vmean):   3.02 cm    PV Peak grad:  2.8 mmHg AV Area (VTI):     2.82 cm AV Vmax:           106.00 cm/s AV Vmean:          61.800 cm/s AV VTI:            0.215 m AV Peak Grad:      4.5 mmHg AV Mean Grad:      2.0 mmHg LVOT Vmax:         89.40 cm/s LVOT Vmean:        53.800 cm/s LVOT VTI:          0.175 m LVOT/AV VTI ratio: 0.81  AORTA Ao Root diam: 3.20 cm Ao Asc diam:  3.00 cm MITRAL VALVE                        TRICUSPID VALVE MV Area (PHT): 3.77 cm             TV Peak grad:   19.0 mmHg MV PHT:        58.29 msec           TV Vmax:        2.18 m/s MV Decel Time: 201 msec MV E velocity: 68.90 cm/s 103 cm/s  SHUNTS MV A velocity: 61.60 cm/s 70.3 cm/s Systemic VTI:  0.18 m MV E/A ratio:  1.12       1.5       Systemic Diam: 2.10 cm  Harold Hedge MD Electronically signed by Harold Hedge MD Signature Date/Time: 12/31/2019/12:18:12 PM    Final        Subjective: Patient has no complaints today.  Mildly hoarse but reports feeling better.  No shortness of breath.  Discharge Exam: Vitals:   01/01/20 0942 01/01/20 0955  BP: (!) 88/60 (!) 99/47  Pulse: 86   Resp:    Temp: 98.3 F (36.8 C)   SpO2: 100%    Vitals:   12/31/19 2008 01/01/20 0644 01/01/20 0942 01/01/20 0955  BP: 108/61 (!) 99/51 (!) 88/60 (!) 99/47  Pulse: 79 60 86   Resp:  18    Temp: 98.7 F (37.1 C) 98.3 F (36.8 C) 98.3 F (36.8 C)   TempSrc: Oral Oral Oral   SpO2: 100% 100% 100%   Weight:      Height:        General: Pt is alert, awake, not in acute distress Cardiovascular: RRR, S1/S2 +, no rubs, no gallops Respiratory: CTA bilaterally, no wheezing, no rhonchi Abdominal: Soft, NT, ND, bowel sounds + Extremities: no edema, no cyanosis    The results of significant diagnostics from this hospitalization (including imaging, microbiology, ancillary and laboratory) are listed below for reference.     Microbiology: Recent Results (from the past 240 hour(s))   Respiratory Panel by RT PCR (Flu A&B, Covid) - Nasopharyngeal Swab     Status: None   Collection Time: 12/30/19 11:24 PM   Specimen: Nasopharyngeal Swab  Result Value Ref Range Status   SARS Coronavirus 2 by RT PCR NEGATIVE NEGATIVE Final    Comment: (NOTE) SARS-CoV-2 target nucleic acids are NOT DETECTED. The SARS-CoV-2 RNA is generally detectable in upper respiratoy specimens during the acute phase of infection. The lowest concentration of SARS-CoV-2 viral copies this assay can detect is 131 copies/mL. A negative result does not preclude SARS-Cov-2 infection and should not be used as the sole  basis for treatment or other patient management decisions. A negative result may occur with  improper specimen collection/handling, submission of specimen other than nasopharyngeal swab, presence of viral mutation(s) within the areas targeted by this assay, and inadequate number of viral copies (<131 copies/mL). A negative result must be combined with clinical observations, patient history, and epidemiological information. The expected result is Negative. Fact Sheet for Patients:  https://www.moore.com/ Fact Sheet for Healthcare Providers:  https://www.young.biz/ This test is not yet ap proved or cleared by the [place] FDA and  has been authorized for detection and/or diagnosis of SARS-CoV-2 by FDA under an Emergency Use Authorization (EUA). This EUA will remain  in effect (meaning this test can be used) for the duration of the COVID-19 declaration under Section 564(b)(1) of the Act, 21 U.S.C. section 360bbb-3(b)(1), unless the authorization is terminated or revoked sooner.    Influenza A by PCR NEGATIVE NEGATIVE Final   Influenza B by PCR NEGATIVE NEGATIVE Final    Comment: (NOTE) The Xpert Xpress SARS-CoV-2/FLU/RSV assay is intended as an aid in  the diagnosis of influenza from Nasopharyngeal swab specimens and  should not be used as a sole  basis for treatment. Nasal washings and  aspirates are unacceptable for Xpert Xpress SARS-CoV-2/FLU/RSV  testing. Fact Sheet for Patients: https://www.moore.com/ Fact Sheet for Healthcare Providers: https://www.young.biz/ This test is not yet approved or cleared by the [place] FDA and  has been authorized for detection and/or diagnosis of SARS-CoV-2 by  FDA under an Emergency Use Authorization (EUA). This EUA will remain  in effect (meaning this test can be used) for the duration of the  Covid-19 declaration under Section 564(b)(1) of the Act, 21  U.S.C. section 360bbb-3(b)(1), unless the authorization is  terminated or revoked. Performed at [facility], [address] [place], [place] [address]      Labs: BNP (last 3 results) Recent Labs    02/18/19 1800 12/30/19 2314  BNP 12.0 21.0   Basic Metabolic Panel: Recent Labs  Lab 12/30/19 2314 01/01/20 0633  NA 138 137  K 3.6 3.6  CL 108 107  CO2 20* 21*  GLUCOSE 94 92  BUN 9 10  CREATININE 0.82 0.59  CALCIUM 9.5 9.0   Liver Function Tests: Recent Labs  Lab 12/30/19 2314  AST 22  ALT 19  ALKPHOS 55  BILITOT 0.5  PROT 7.9  ALBUMIN 4.0   No results for input(s): LIPASE, AMYLASE in the last 168 hours. No results for input(s): AMMONIA in the last 168 hours. CBC: Recent Labs  Lab 12/30/19 2314 12/31/19 0346  WBC 6.5 5.6  NEUTROABS 3.1  --   HGB 11.6* 10.7*  HCT 35.8* 33.6*  MCV 83.3 83.4  PLT 295 271   Cardiac Enzymes: No results for input(s): CKTOTAL, CKMB, CKMBINDEX, TROPONINI in the last 168 hours. BNP: Invalid input(s): POCBNP CBG: No results for input(s): GLUCAP in the last 168 hours. D-Dimer No results for input(s): DDIMER in the last 72 hours. Hgb A1c No results for input(s): HGBA1C in the last 72 hours. Lipid Profile No results for input(s): CHOL, HDL, LDLCALC, TRIG, CHOLHDL, LDLDIRECT in the last 72 hours. Thyroid function  studies Recent Labs    12/30/19 2314  TSH 1.864   Anemia work up No results for input(s): VITAMINB12, FOLATE, FERRITIN, TIBC, IRON, RETICCTPCT in the last 72 hours. Urinalysis    Component Value Date/Time   COLORURINE STRAW (A) 05/04/2016 0633   APPEARANCEUR CLEAR (A) 05/04/2016 0633   APPEARANCEUR Hazy 02/17/2013  1129   LABSPEC 1.009 05/04/2016 0633   LABSPEC 1.026 02/17/2013 1129   PHURINE 8.0 05/04/2016 0633   GLUCOSEU NEGATIVE 05/04/2016 0633   GLUCOSEU Negative 02/17/2013 1129   HGBUR 3+ (A) 05/04/2016 0633   BILIRUBINUR NEGATIVE 05/04/2016 0633   BILIRUBINUR Negative 02/17/2013 1129   KETONESUR NEGATIVE 05/04/2016 0633   PROTEINUR NEGATIVE 05/04/2016 0633   NITRITE NEGATIVE 05/04/2016 0633   LEUKOCYTESUR NEGATIVE 05/04/2016 0633   LEUKOCYTESUR Negative 02/17/2013 1129   Sepsis Labs Invalid input(s): PROCALCITONIN,  WBC,  LACTICIDVEN Microbiology Recent Results (from the past 240 hour(s))  Respiratory Panel by RT PCR (Flu A&B, Covid) - Nasopharyngeal Swab     Status: None   Collection Time: 12/30/19 11:24 PM   Specimen: Nasopharyngeal Swab  Result Value Ref Range Status   SARS Coronavirus 2 by RT PCR NEGATIVE NEGATIVE Final    Comment: (NOTE) SARS-CoV-2 target nucleic acids are NOT DETECTED. The SARS-CoV-2 RNA is generally detectable in upper respiratoy specimens during the acute phase of infection. The lowest concentration of SARS-CoV-2 viral copies this assay can detect is 131 copies/mL. A negative result does not preclude SARS-Cov-2 infection and should not be used as the sole basis for treatment or other patient management decisions. A negative result may occur with  improper specimen collection/handling, submission of specimen other than nasopharyngeal swab, presence of viral mutation(s) within the areas targeted by this assay, and inadequate number of viral copies (<131 copies/mL). A negative result must be combined with clinical observations, patient  history, and epidemiological information. The expected result is Negative. Fact Sheet for Patients:  https://www.moore.com/https://www.fda.gov/media/142436/download Fact Sheet for Healthcare Providers:  https://www.young.biz/https://www.fda.gov/media/142435/download This test is not yet ap proved or cleared by the [place] FDA and  has been authorized for detection and/or diagnosis of SARS-CoV-2 by FDA under an Emergency Use Authorization (EUA). This EUA will remain  in effect (meaning this test can be used) for the duration of the COVID-19 declaration under Section 564(b)(1) of the Act, 21 U.S.C. section 360bbb-3(b)(1), unless the authorization is terminated or revoked sooner.    Influenza A by PCR NEGATIVE NEGATIVE Final   Influenza B by PCR NEGATIVE NEGATIVE Final    Comment: (NOTE) The Xpert Xpress SARS-CoV-2/FLU/RSV assay is intended as an aid in  the diagnosis of influenza from Nasopharyngeal swab specimens and  should not be used as a sole basis for treatment. Nasal washings and  aspirates are unacceptable for Xpert Xpress SARS-CoV-2/FLU/RSV  testing. Fact Sheet for Patients: https://www.moore.com/https://www.fda.gov/media/142436/download Fact Sheet for Healthcare Providers: https://www.young.biz/https://www.fda.gov/media/142435/download This test is not yet approved or cleared by the [place] FDA and  has been authorized for detection and/or diagnosis of SARS-CoV-2 by  FDA under an Emergency Use Authorization (EUA). This EUA will remain  in effect (meaning this test can be used) for the duration of the  Covid-19 declaration under Section 564(b)(1) of the Act, 21  U.S.C. section 360bbb-3(b)(1), unless the authorization is  terminated or revoked. Performed at [facility], [address] [place], [place] [id number]      Time coordinating discharge: Over 30 minutes  SIGNED:   Lynn ItoSahar Isaah Furry, MD  [facility] 01/01/2020, 10:51 AM Pager   If 7PM-7AM, please contact night-coverage [email or web address] Password [email or web address]

## 2020-01-03 DIAGNOSIS — M546 Pain in thoracic spine: Secondary | ICD-10-CM | POA: Diagnosis not present

## 2020-01-03 DIAGNOSIS — M9901 Segmental and somatic dysfunction of cervical region: Secondary | ICD-10-CM | POA: Diagnosis not present

## 2020-01-03 DIAGNOSIS — M9902 Segmental and somatic dysfunction of thoracic region: Secondary | ICD-10-CM | POA: Diagnosis not present

## 2020-01-03 DIAGNOSIS — M542 Cervicalgia: Secondary | ICD-10-CM | POA: Diagnosis not present

## 2020-01-04 DIAGNOSIS — M9901 Segmental and somatic dysfunction of cervical region: Secondary | ICD-10-CM | POA: Diagnosis not present

## 2020-01-04 DIAGNOSIS — M542 Cervicalgia: Secondary | ICD-10-CM | POA: Diagnosis not present

## 2020-01-04 DIAGNOSIS — M546 Pain in thoracic spine: Secondary | ICD-10-CM | POA: Diagnosis not present

## 2020-01-04 DIAGNOSIS — M9902 Segmental and somatic dysfunction of thoracic region: Secondary | ICD-10-CM | POA: Diagnosis not present

## 2020-01-06 DIAGNOSIS — M9901 Segmental and somatic dysfunction of cervical region: Secondary | ICD-10-CM | POA: Diagnosis not present

## 2020-01-06 DIAGNOSIS — M542 Cervicalgia: Secondary | ICD-10-CM | POA: Diagnosis not present

## 2020-01-06 DIAGNOSIS — M546 Pain in thoracic spine: Secondary | ICD-10-CM | POA: Diagnosis not present

## 2020-01-06 DIAGNOSIS — M9902 Segmental and somatic dysfunction of thoracic region: Secondary | ICD-10-CM | POA: Diagnosis not present

## 2020-01-10 DIAGNOSIS — M546 Pain in thoracic spine: Secondary | ICD-10-CM | POA: Diagnosis not present

## 2020-01-10 DIAGNOSIS — M9902 Segmental and somatic dysfunction of thoracic region: Secondary | ICD-10-CM | POA: Diagnosis not present

## 2020-01-10 DIAGNOSIS — M542 Cervicalgia: Secondary | ICD-10-CM | POA: Diagnosis not present

## 2020-01-10 DIAGNOSIS — M9901 Segmental and somatic dysfunction of cervical region: Secondary | ICD-10-CM | POA: Diagnosis not present

## 2020-01-17 DIAGNOSIS — M546 Pain in thoracic spine: Secondary | ICD-10-CM | POA: Diagnosis not present

## 2020-01-17 DIAGNOSIS — M9902 Segmental and somatic dysfunction of thoracic region: Secondary | ICD-10-CM | POA: Diagnosis not present

## 2020-01-17 DIAGNOSIS — M9901 Segmental and somatic dysfunction of cervical region: Secondary | ICD-10-CM | POA: Diagnosis not present

## 2020-01-17 DIAGNOSIS — M542 Cervicalgia: Secondary | ICD-10-CM | POA: Diagnosis not present

## 2020-01-18 DIAGNOSIS — M546 Pain in thoracic spine: Secondary | ICD-10-CM | POA: Diagnosis not present

## 2020-01-18 DIAGNOSIS — M9902 Segmental and somatic dysfunction of thoracic region: Secondary | ICD-10-CM | POA: Diagnosis not present

## 2020-01-18 DIAGNOSIS — M9901 Segmental and somatic dysfunction of cervical region: Secondary | ICD-10-CM | POA: Diagnosis not present

## 2020-01-18 DIAGNOSIS — M542 Cervicalgia: Secondary | ICD-10-CM | POA: Diagnosis not present

## 2020-01-20 DIAGNOSIS — M546 Pain in thoracic spine: Secondary | ICD-10-CM | POA: Diagnosis not present

## 2020-01-20 DIAGNOSIS — M9902 Segmental and somatic dysfunction of thoracic region: Secondary | ICD-10-CM | POA: Diagnosis not present

## 2020-01-20 DIAGNOSIS — M542 Cervicalgia: Secondary | ICD-10-CM | POA: Diagnosis not present

## 2020-01-20 DIAGNOSIS — M9901 Segmental and somatic dysfunction of cervical region: Secondary | ICD-10-CM | POA: Diagnosis not present

## 2020-01-24 DIAGNOSIS — M542 Cervicalgia: Secondary | ICD-10-CM | POA: Diagnosis not present

## 2020-01-24 DIAGNOSIS — M9901 Segmental and somatic dysfunction of cervical region: Secondary | ICD-10-CM | POA: Diagnosis not present

## 2020-01-24 DIAGNOSIS — M9902 Segmental and somatic dysfunction of thoracic region: Secondary | ICD-10-CM | POA: Diagnosis not present

## 2020-01-24 DIAGNOSIS — M546 Pain in thoracic spine: Secondary | ICD-10-CM | POA: Diagnosis not present

## 2020-01-27 DIAGNOSIS — M546 Pain in thoracic spine: Secondary | ICD-10-CM | POA: Diagnosis not present

## 2020-01-27 DIAGNOSIS — M9902 Segmental and somatic dysfunction of thoracic region: Secondary | ICD-10-CM | POA: Diagnosis not present

## 2020-01-27 DIAGNOSIS — M542 Cervicalgia: Secondary | ICD-10-CM | POA: Diagnosis not present

## 2020-01-27 DIAGNOSIS — M9901 Segmental and somatic dysfunction of cervical region: Secondary | ICD-10-CM | POA: Diagnosis not present

## 2020-01-29 ENCOUNTER — Other Ambulatory Visit: Payer: Self-pay | Admitting: Family Medicine

## 2020-01-29 DIAGNOSIS — Z1239 Encounter for other screening for malignant neoplasm of breast: Secondary | ICD-10-CM

## 2020-01-31 ENCOUNTER — Encounter: Payer: Self-pay | Admitting: Family Medicine

## 2020-01-31 DIAGNOSIS — M542 Cervicalgia: Secondary | ICD-10-CM | POA: Diagnosis not present

## 2020-01-31 DIAGNOSIS — M546 Pain in thoracic spine: Secondary | ICD-10-CM | POA: Diagnosis not present

## 2020-01-31 DIAGNOSIS — R739 Hyperglycemia, unspecified: Secondary | ICD-10-CM | POA: Insufficient documentation

## 2020-01-31 DIAGNOSIS — M9901 Segmental and somatic dysfunction of cervical region: Secondary | ICD-10-CM | POA: Diagnosis not present

## 2020-01-31 DIAGNOSIS — M9902 Segmental and somatic dysfunction of thoracic region: Secondary | ICD-10-CM | POA: Diagnosis not present

## 2020-02-03 DIAGNOSIS — M9902 Segmental and somatic dysfunction of thoracic region: Secondary | ICD-10-CM | POA: Diagnosis not present

## 2020-02-03 DIAGNOSIS — M542 Cervicalgia: Secondary | ICD-10-CM | POA: Diagnosis not present

## 2020-02-03 DIAGNOSIS — M546 Pain in thoracic spine: Secondary | ICD-10-CM | POA: Diagnosis not present

## 2020-02-03 DIAGNOSIS — M9901 Segmental and somatic dysfunction of cervical region: Secondary | ICD-10-CM | POA: Diagnosis not present

## 2020-02-07 DIAGNOSIS — M546 Pain in thoracic spine: Secondary | ICD-10-CM | POA: Diagnosis not present

## 2020-02-07 DIAGNOSIS — M9901 Segmental and somatic dysfunction of cervical region: Secondary | ICD-10-CM | POA: Diagnosis not present

## 2020-02-07 DIAGNOSIS — M542 Cervicalgia: Secondary | ICD-10-CM | POA: Diagnosis not present

## 2020-02-07 DIAGNOSIS — M9902 Segmental and somatic dysfunction of thoracic region: Secondary | ICD-10-CM | POA: Diagnosis not present

## 2020-02-10 DIAGNOSIS — M9902 Segmental and somatic dysfunction of thoracic region: Secondary | ICD-10-CM | POA: Diagnosis not present

## 2020-02-10 DIAGNOSIS — M546 Pain in thoracic spine: Secondary | ICD-10-CM | POA: Diagnosis not present

## 2020-02-10 DIAGNOSIS — M9901 Segmental and somatic dysfunction of cervical region: Secondary | ICD-10-CM | POA: Diagnosis not present

## 2020-02-10 DIAGNOSIS — M542 Cervicalgia: Secondary | ICD-10-CM | POA: Diagnosis not present

## 2020-02-17 DIAGNOSIS — M9902 Segmental and somatic dysfunction of thoracic region: Secondary | ICD-10-CM | POA: Diagnosis not present

## 2020-02-17 DIAGNOSIS — M542 Cervicalgia: Secondary | ICD-10-CM | POA: Diagnosis not present

## 2020-02-17 DIAGNOSIS — M9901 Segmental and somatic dysfunction of cervical region: Secondary | ICD-10-CM | POA: Diagnosis not present

## 2020-02-17 DIAGNOSIS — M546 Pain in thoracic spine: Secondary | ICD-10-CM | POA: Diagnosis not present

## 2020-02-28 DIAGNOSIS — M9902 Segmental and somatic dysfunction of thoracic region: Secondary | ICD-10-CM | POA: Diagnosis not present

## 2020-02-28 DIAGNOSIS — M9901 Segmental and somatic dysfunction of cervical region: Secondary | ICD-10-CM | POA: Diagnosis not present

## 2020-02-28 DIAGNOSIS — M546 Pain in thoracic spine: Secondary | ICD-10-CM | POA: Diagnosis not present

## 2020-02-28 DIAGNOSIS — M542 Cervicalgia: Secondary | ICD-10-CM | POA: Diagnosis not present

## 2020-03-02 DIAGNOSIS — M9901 Segmental and somatic dysfunction of cervical region: Secondary | ICD-10-CM | POA: Diagnosis not present

## 2020-03-02 DIAGNOSIS — M546 Pain in thoracic spine: Secondary | ICD-10-CM | POA: Diagnosis not present

## 2020-03-02 DIAGNOSIS — M9902 Segmental and somatic dysfunction of thoracic region: Secondary | ICD-10-CM | POA: Diagnosis not present

## 2020-03-02 DIAGNOSIS — M542 Cervicalgia: Secondary | ICD-10-CM | POA: Diagnosis not present

## 2020-03-06 DIAGNOSIS — M542 Cervicalgia: Secondary | ICD-10-CM | POA: Diagnosis not present

## 2020-03-06 DIAGNOSIS — M9901 Segmental and somatic dysfunction of cervical region: Secondary | ICD-10-CM | POA: Diagnosis not present

## 2020-03-06 DIAGNOSIS — M9902 Segmental and somatic dysfunction of thoracic region: Secondary | ICD-10-CM | POA: Diagnosis not present

## 2020-03-06 DIAGNOSIS — M546 Pain in thoracic spine: Secondary | ICD-10-CM | POA: Diagnosis not present

## 2020-03-09 DIAGNOSIS — M9901 Segmental and somatic dysfunction of cervical region: Secondary | ICD-10-CM | POA: Diagnosis not present

## 2020-03-09 DIAGNOSIS — M542 Cervicalgia: Secondary | ICD-10-CM | POA: Diagnosis not present

## 2020-03-09 DIAGNOSIS — M9902 Segmental and somatic dysfunction of thoracic region: Secondary | ICD-10-CM | POA: Diagnosis not present

## 2020-03-09 DIAGNOSIS — M546 Pain in thoracic spine: Secondary | ICD-10-CM | POA: Diagnosis not present

## 2020-03-13 DIAGNOSIS — M542 Cervicalgia: Secondary | ICD-10-CM | POA: Diagnosis not present

## 2020-03-13 DIAGNOSIS — M546 Pain in thoracic spine: Secondary | ICD-10-CM | POA: Diagnosis not present

## 2020-03-13 DIAGNOSIS — M9902 Segmental and somatic dysfunction of thoracic region: Secondary | ICD-10-CM | POA: Diagnosis not present

## 2020-03-13 DIAGNOSIS — M9901 Segmental and somatic dysfunction of cervical region: Secondary | ICD-10-CM | POA: Diagnosis not present

## 2020-03-16 DIAGNOSIS — M546 Pain in thoracic spine: Secondary | ICD-10-CM | POA: Diagnosis not present

## 2020-03-16 DIAGNOSIS — M9902 Segmental and somatic dysfunction of thoracic region: Secondary | ICD-10-CM | POA: Diagnosis not present

## 2020-03-16 DIAGNOSIS — M542 Cervicalgia: Secondary | ICD-10-CM | POA: Diagnosis not present

## 2020-03-16 DIAGNOSIS — M9901 Segmental and somatic dysfunction of cervical region: Secondary | ICD-10-CM | POA: Diagnosis not present

## 2020-03-30 ENCOUNTER — Ambulatory Visit: Payer: Self-pay | Attending: Internal Medicine

## 2020-03-30 DIAGNOSIS — Z23 Encounter for immunization: Secondary | ICD-10-CM

## 2020-03-30 NOTE — Progress Notes (Signed)
   Covid-19 Vaccination Clinic  Name:  Katie Hays    MRN: [id number] DOB: 1981/10/16  03/30/2020  [name] was observed post Covid-19 immunization for 15 minutes without incident. She was provided with Vaccine Information Sheet and instruction to access the V-Safe system.   [name] was instructed to call 911 with any severe reactions post vaccine: Marland Kitchen Difficulty breathing  . Swelling of face and throat  . A fast heartbeat  . A bad rash all over body  . Dizziness and weakness   Immunizations Administered    Name Date Dose VIS Date Route   Pfizer COVID-19 Vaccine 03/30/2020  9:41 AM 0.3 mL 01/18/2019 Intramuscular   Manufacturer: [employer], [employer]   Lot: [id number]   NDC: [address]-3

## 2020-04-16 ENCOUNTER — Ambulatory Visit
Admission: RE | Admit: 2020-04-16 | Discharge: 2020-04-16 | Disposition: A | Discharge status: Home / Self Care | Payer: BC Managed Care – PPO | Source: Ambulatory Visit | Attending: Internal Medicine | Admitting: Internal Medicine

## 2020-04-16 ENCOUNTER — Ambulatory Visit (INDEPENDENT_AMBULATORY_CARE_PROVIDER_SITE_OTHER): Payer: BC Managed Care – PPO | Admitting: Internal Medicine

## 2020-04-16 ENCOUNTER — Other Ambulatory Visit: Payer: Self-pay

## 2020-04-16 ENCOUNTER — Ambulatory Visit
Admission: RE | Admit: 2020-04-16 | Discharge: 2020-04-16 | Disposition: A | Discharge status: Home / Self Care | Payer: BC Managed Care – PPO | Attending: Internal Medicine | Admitting: Internal Medicine

## 2020-04-16 ENCOUNTER — Encounter: Payer: Self-pay | Admitting: Internal Medicine

## 2020-04-16 VITALS — BP 102/64 | HR 72 | Temp 98.3°F | Resp 16 | Ht 66.0 in | Wt 172.9 lb

## 2020-04-16 DIAGNOSIS — M25562 Pain in left knee: Secondary | ICD-10-CM

## 2020-04-16 DIAGNOSIS — G8929 Other chronic pain: Secondary | ICD-10-CM

## 2020-04-16 DIAGNOSIS — R519 Headache, unspecified: Secondary | ICD-10-CM

## 2020-04-16 DIAGNOSIS — R600 Localized edema: Secondary | ICD-10-CM | POA: Diagnosis not present

## 2020-04-16 NOTE — Progress Notes (Signed)
Patient ID: Katie Hays, female    DOB: Sep 09, 1981, 39 y.o.   MRN: [id number]  PCP: Katie Cory, MD  Chief Complaint  Patient presents with  . Leg Swelling    left leg swelling, painful to bend leg and walk    Subjective:   Katie Hays is a 39 y.o. female, presents to clinic with CC of the following:  Chief Complaint  Patient presents with  . Leg Swelling    left leg swelling, painful to bend leg and walk    HPI:  Patient is a 39 year old female patient of Dr. Carlynn Purl She was admitted to the hospital 12/30/2019 with acute dyspnea Discharge summary was as follows:  [name] medical history significant forhistory of mitral valve prolapse and history of trace pericardial effusion on echocardiogram from 2018,history of a hospitalization in March 2020 for pneumonia, who presents to the emergency room in respiratory distress and tachypneic with respirations up to 40 but maintaining sats at 100% on room air. On arrival in the emergency room she was afebrile with blood pressure 119/84, heart rate 84 respirations 35 with O2 sat 100% on room air.Venous blood gas showed pH of 7.62, venous PCO2 less than 19 and venous PO2 102. Her blood work was mostly unremarkable. Hemoglobin 11.6. D-dimer was 432. Flu and Covid test negative. TSH normal at 1.86. BNP was 21. She had a CT chest that showed no evidence of acute PE but did show small to moderate-sized pericardial effusion with some redistribution and fluid but likely overall slight interval increase in size. Trace bilateral effusions right greater than left.She received Ativan in the emergency room with significant improvement in her symptoms. Hospitalist consulted for evaluation of cardiac etiology.Cardiology was consulted.  Patient underwent echocardiogram revealing EF with small pericardial effusion without evidence of tamponade.  Cardiology cleared patient for discharge no further  cardiac work-up needed.  Part of her issue was anxiety which she needs to follow-up with her primary care for further evaluation. SARS Covid test was negative.  She was supposed to follow-up with her primary care physician and cardiology within a week or 2 of discharge although that was not done.  She noted the follow-up at [facility] with her primary care provider was canceled due to getting her Covid vaccination, and that cardiology had called her and was going get back to her for a follow-up visit in the future. She follows up today with left leg swelling and pain, and upon further discussion, it was more her left knee. She states Tuesday/Wednesday of last week she started to have pain in her left knee while in [place] at a conference, noted more stiffness feeling and then this past weekend, at her son's birthday party which was paintball activities, she did a lot of walking, and the pain and discomfort continued to increase.  It is now more difficult to walk due to the pain and stiffness.  No one time trauma preceded the onset Feels the pain more on the medial side of her knee, with the stiffness feeling coming up to the thigh.  Does not feel pain on the kneecap region, nor has the kneecap ever dislocated or going out.  She thinks it has swollen some over the past week plus Did try ice, and then heat in the last couple days and then her husband told her not to heat, and went back to just ice Not locking, but occasionally does feel like getting stuck, also  occasionally feels like it can give out on her, No h/o major knee injury in his past before this started  She is currently seeing physical therapy for a proximal right leg injury, and notes that is much better with physical therapy's involvement.  She is also then noted pain she gets above the eyes and more lateral on both sides and stated it feels like a "nerve pain "that when she presses in this area on the lateral eyebrow region bilaterally, she gets  sharp radiating pains that she feels behind her eyes and sometimes into her ears bilaterally.  Is quite sharp, and notes it has been going on for 3 to 4 months.  She then also noted her eyes can get red after and it is not like typical right eye redness with allergies.  She denied any double vision, loss of vision, facial droop or facial muscle weakness.  Denied any more recent infectious symptoms of fever, increased mucus, cough, or Covid infection concerns. She does have a history of migraine headache noted.  Tobacco-never smoker Alcohol-denied use  Patient Active Problem List   Diagnosis Date Noted  . Hyperglycemia 01/31/2020  . Acute dyspnea 12/31/2019  . Pericardial effusion 12/31/2019  . Vitamin D deficiency 01/03/2019  . Family history of heart disease in female family member before age 15 07/04/2016  . MVP (mitral valve prolapse) 10/28/2015  . Migraine headache 10/25/2015  . H/O cesarean section 12/02/2012  . History of blood transfusion 12/02/2012      Current Outpatient Medications:  .  cetirizine (ZYRTEC) 10 MG tablet, Take 10 mg by mouth daily., Disp: , Rfl:  .  ferrous sulfate 325 (65 FE) MG tablet, Take 1 tablet (325 mg total) by mouth 3 (three) times daily with meals. Take colace if needed, Disp: 270 tablet, Rfl: 1 .  Multiple Vitamins-Minerals (MULTIVITAMIN GUMMIES WOMENS PO), Take 2 each by mouth daily. VitaFusion Organic, Disp: , Rfl:    Allergies  Allergen Reactions  . Dilaudid [Hydromorphone Hcl] Swelling     Past Surgical History:  Procedure Laterality Date  . CESAREAN SECTION  04/30/2016   04/16/2012  . TUBAL LIGATION Bilateral 04/30/2016     Family History  Problem Relation Age of Onset  . Arthritis Mother   . Heart disease Father   . Diabetes Father   . Hypertension Father   . Asthma Sister   . Hypertension Sister   . Diabetes Sister   . Asthma Brother   . Heart attack Brother   . Asthma Sister   . Hypertension Sister   . Heart attack Sister       Social History   Tobacco Use  . Smoking status: Never Smoker  . Smokeless tobacco: Never Used  Substance Use Topics  . Alcohol use: Never    Comment: never    With staff assistance, above reviewed with the patient today.  ROS: As per HPI, otherwise no specific complaints on a limited and focused system review   No results found for this or any previous visit (from the past 72 hour(s)).   PHQ2/9: Depression screen [facility] 2/9 04/16/2020 12/06/2019 02/08/2019 12/03/2018 12/02/2017  Decreased Interest 0 1 0 0 0  Down, Depressed, Hopeless 0 0 0 0 0  PHQ - 2 Score 0 1 0 0 0  Altered sleeping 0 1 2 1  -  Tired, decreased energy 1 1 1 3  -  Change in appetite 0 1 0 1 -  Feeling bad or failure about yourself  0 0  0 0 -  Trouble concentrating 0 0 0 0 -  Moving slowly or fidgety/restless 0 0 0 0 -  Suicidal thoughts 0 0 0 0 -  PHQ-9 Score 1 4 3 5  -  Difficult doing work/chores Not difficult at all Not difficult at all Not difficult at all Not difficult at all -   PHQ-2/9 Result reviewed  Fall Risk: Fall Risk  04/16/2020 12/06/2019 02/08/2019 12/03/2018 12/02/2017  Falls in the past year? 0 0 0 0 No  Number falls in past yr: 0 0 0 0 -  Injury with Fall? 0 0 0 0 -      Objective:   Vitals:   04/16/20 1422  BP: 102/64  Pulse: 72  Resp: 16  Temp: 98.3 F (36.8 C)  TempSrc: Temporal  SpO2: 100%  Weight: 172 lb 14.4 oz (78.4 kg)  Height: 5\' 6"  (1.676 m)    Body mass index is 27.91 kg/m.  Physical Exam   NAD, masked, pleasant HEENT - [place]/AT, sclera anicteric, PERRL, EOMI, conj - non-inj'ed, TM's and canals clear, pharynx clear, nontender palpating in the forehead and temporal area where she feels the pain, nor can I reproduce it with palpating.  Sensation intact to light touch in the face, no facial droop noted, Neck - supple, no rigidity, no adenopathy,  Car -not tachycardic Skin- no rash noted on the face or on exposed areas,  Ext -  Right knee - Examined uninjured knee first  and stable, good range of motion with Lachman negative  left Knee - Good ROM, mild discomfort with ROM and increased with knee flexion past 20-30 degrees, more painful in the last 10 degrees of flexion, could fully extend, with mild discomfort progressing to full extension.  Mild crepitus on ROM testing  Questioned a very mild effusion, not marked  No bruising  NT with movement of the patella, no gross deformity of patella  NT suprapatella bursa region  No focal medial or lateral joint line tenderness with palpation, noted she felt some pain on the medial aspect of the knee diffusely in the joint line region and slightly superior towards the patella and extending cephalad.  No obvious plica component  Med and lat collaterals intact on testing with no gap, no pain with testing  Lachman neg  McMurrays limited due to pain with motion, with question of slight increased pain testing the medial meniscus versus the lateral meniscus, but again limited  No pain posterior, no obvious Baker's cyst No calf pain, no increased erythema or warmth, no cords palpated Sensation intact to light touch distally and had good strength of the ankle and foot.  Neuro/psychiatric - affect was not flat, appropriate with conversation  Alert and oriented  Grossly non-focal   Speech  normal   Results for orders placed or performed during the hospital encounter of 12/30/19  Respiratory Panel by RT PCR (Flu A&B, Covid) - Nasopharyngeal Swab   Specimen: Nasopharyngeal Swab  Result Value Ref Range   SARS Coronavirus 2 by RT PCR NEGATIVE NEGATIVE   Influenza A by PCR NEGATIVE NEGATIVE   Influenza B by PCR NEGATIVE NEGATIVE  CBC with Differential  Result Value Ref Range   WBC 6.5 4.0 - 10.5 K/uL   RBC 4.30 3.87 - 5.11 MIL/uL   Hemoglobin 11.6 (L) 12.0 - 15.0 g/dL   HCT 96.035.8 (L) 45.436.0 - 09.846.0 %   MCV 83.3 80.0 - 100.0 fL   MCH 27.0 26.0 - 34.0 pg   MCHC 32.4  30.0 - 36.0 g/dL   RDW 19.3 (H) 11.5 - 15.5 %   Platelets 295  150 - 400 K/uL   nRBC 0.0 0.0 - 0.2 %   Neutrophils Relative % 47 %   Neutro Abs 3.1 1.7 - 7.7 K/uL   Lymphocytes Relative 40 %   Lymphs Abs 2.6 0.7 - 4.0 K/uL   Monocytes Relative 9 %   Monocytes Absolute 0.6 0.1 - 1.0 K/uL   Eosinophils Relative 3 %   Eosinophils Absolute 0.2 0.0 - 0.5 K/uL   Basophils Relative 1 %   Basophils Absolute 0.0 0.0 - 0.1 K/uL   Immature Granulocytes 0 %   Abs Immature Granulocytes 0.02 0.00 - 0.07 K/uL  Brain natriuretic peptide  Result Value Ref Range   B Natriuretic Peptide 21.0 0.0 - 100.0 pg/mL  Fibrin derivatives D-Dimer  Result Value Ref Range   Fibrin derivatives D-dimer ([facility]) 432.55 0.00 - 499.00 ng/mL (FEU)  Comprehensive metabolic panel  Result Value Ref Range   Sodium 138 135 - 145 mmol/L   Potassium 3.6 3.5 - 5.1 mmol/L   Chloride 108 98 - 111 mmol/L   CO2 20 (L) 22 - 32 mmol/L   Glucose, Bld 94 70 - 99 mg/dL   BUN 9 6 - 20 mg/dL   Creatinine, Ser 0.82 0.44 - 1.00 mg/dL   Calcium 9.5 8.9 - 10.3 mg/dL   Total Protein 7.9 6.5 - 8.1 g/dL   Albumin 4.0 3.5 - 5.0 g/dL   AST 22 15 - 41 U/L   ALT 19 0 - 44 U/L   Alkaline Phosphatase 55 38 - 126 U/L   Total Bilirubin 0.5 0.3 - 1.2 mg/dL   GFR calc non Af Amer >60 >60 mL/min   GFR calc Af Amer >60 >60 mL/min   Anion gap 10 5 - 15  Blood gas, venous  Result Value Ref Range   pH, Ven 7.62 (HH) 7.250 - 7.430   pCO2, Ven <19.0 (LL) 44.0 - 60.0 mmHg   pO2, Ven 102.0 (H) 32.0 - 45.0 mmHg   Bicarbonate 18.5 (L) 20.0 - 28.0 mmol/L   Acid-base deficit 0.3 0.0 - 2.0 mmol/L   O2 Saturation 98.8 %   Patient temperature 37.0    Collection site VENOUS    Sample type VENOUS   TSH  Result Value Ref Range   TSH 1.864 0.350 - 4.500 uIU/mL  Sedimentation rate  Result Value Ref Range   Sed Rate 35 (H) 0 - 20 mm/hr  HIV Antibody (routine testing w rflx)  Result Value Ref Range   HIV Screen 4th Generation wRfx NON REACTIVE NON REACTIVE  CBC  Result Value Ref Range   WBC 5.6 4.0 - 10.5 K/uL    RBC 4.03 3.87 - 5.11 MIL/uL   Hemoglobin 10.7 (L) 12.0 - 15.0 g/dL   HCT 33.6 (L) 36.0 - 46.0 %   MCV 83.4 80.0 - 100.0 fL   MCH 26.6 26.0 - 34.0 pg   MCHC 31.8 30.0 - 36.0 g/dL   RDW 19.1 (H) 11.5 - 15.5 %   Platelets 271 150 - 400 K/uL   nRBC 0.0 0.0 - 0.2 %  Basic metabolic panel  Result Value Ref Range   Sodium 137 135 - 145 mmol/L   Potassium 3.6 3.5 - 5.1 mmol/L   Chloride 107 98 - 111 mmol/L   CO2 21 (L) 22 - 32 mmol/L   Glucose, Bld 92 70 - 99 mg/dL   BUN 10 6 -  20 mg/dL   Creatinine, Ser 5.73 0.44 - 1.00 mg/dL   Calcium 9.0 8.9 - 22.0 mg/dL   GFR calc non Af Amer >60 >60 mL/min   GFR calc Af Amer >60 >60 mL/min   Anion gap 9 5 - 15  POC SARS Coronavirus 2 Ag  Result Value Ref Range   SARS Coronavirus 2 Ag NEGATIVE NEGATIVE  ECHOCARDIOGRAM COMPLETE  Result Value Ref Range   Weight 2,720 oz   Height 67 in   BP 106/58 mmHg  Type and screen [facility]  Result Value Ref Range   ABO/RH(D) O POS    Antibody Screen NEG    Sample Expiration      01/02/2020,2359 Performed at [facility] [address] [place], [place] [address]   Troponin I (High Sensitivity)  Result Value Ref Range   Troponin I (High Sensitivity) <2 <18 ng/L  Troponin I (High Sensitivity)  Result Value Ref Range   Troponin I (High Sensitivity) 3 <18 ng/L       Assessment & Plan:   1. Chronic nonintractable headache, unspecified headache type 2. Facial pain Discussed that the source of the symptoms is unclear presently, with her describing it as a nerve pain and facial pain more than a typical headache component.  She does have a history of migraine headache.  No facial muscle weakness or palsies, no vision concerns Discussed options, and she would prefer to talk to her neurologist to help get their opinion. A referral was written. - Ambulatory referral to Neurology  3. Acute pain of left knee Discussed options and next steps, with some concern for a possible  meniscal component to the source of her pain.  Do feel has a patellofemoral component as well, although unlikely the source of the more acute pain felt on the inside of her knee. Do feel getting an x-ray is helpful MRI was ordered Emphasized ice at 10 to 15-minute intervals, not using heat Can use a generic naproxen product (like Aleve)-and can take 2 tablets up to twice daily with food to help with the discomfort Relative rest emphasized with no weightbearing exercise, trying to minimize as much as she can with weightbearing activities in the short-term with elevation also helpful. Also can try a neoprene sleeve, as well help with support when up and about some, although when removed, emphasized icing after. If x-ray is okay, do feel a trial of physical therapy would be helpful, and she notes that has been helpful for her right leg pain issue recently. I did note the other option was to see orthopedics, who may pursue further imaging like an MRI if more concerns arise after their assessment. She was referred to see physical therapy first and assess her response to that before rushing to a follow-up with orthopedics. - DG Knee Complete 4 Views Left; Future  Await x-ray result presently, and if okay, will refer to PT and assess her response. If symptoms not improving or increasing, likely will refer to orthopedics with possible further imaging needed and she was understanding of that. Also await neurology input presently      Jamelle Haring, MD 04/16/20 2:29 PM

## 2020-04-16 NOTE — Patient Instructions (Signed)

## 2020-04-17 ENCOUNTER — Other Ambulatory Visit: Payer: Self-pay | Admitting: Internal Medicine

## 2020-04-17 DIAGNOSIS — M25562 Pain in left knee: Secondary | ICD-10-CM

## 2020-04-17 NOTE — Progress Notes (Signed)
Patient's x-ray report was reviewed, no fracture or big joint effusion. Physical therapy recommended and ordered.

## 2020-04-24 ENCOUNTER — Ambulatory Visit: Payer: Self-pay

## 2020-04-26 DIAGNOSIS — R200 Anesthesia of skin: Secondary | ICD-10-CM | POA: Diagnosis not present

## 2020-04-26 DIAGNOSIS — R519 Headache, unspecified: Secondary | ICD-10-CM | POA: Diagnosis not present

## 2020-04-28 ENCOUNTER — Ambulatory Visit: Payer: BC Managed Care – PPO | Attending: Internal Medicine

## 2020-04-28 DIAGNOSIS — Z23 Encounter for immunization: Secondary | ICD-10-CM

## 2020-04-28 NOTE — Progress Notes (Signed)
   Covid-19 Vaccination Clinic  Name:  Katie Hays    MRN: [id number] DOB: 11/13/1981  04/28/2020  [name] was observed post Covid-19 immunization for 15 minutes without incident. She was provided with Vaccine Information Sheet and instruction to access the V-Safe system.   [name] was instructed to call 911 with any severe reactions post vaccine: Marland Kitchen Difficulty breathing  . Swelling of face and throat  . A fast heartbeat  . A bad rash all over body  . Dizziness and weakness   Immunizations Administered    Name Date Dose VIS Date Route   Pfizer COVID-19 Vaccine 04/28/2020  8:07 AM 0.3 mL 01/18/2019 Intramuscular   Manufacturer: [employer], [employer]   Lot: [id number]   NDC: [address]-0

## 2020-05-10 DIAGNOSIS — R519 Headache, unspecified: Secondary | ICD-10-CM | POA: Diagnosis not present

## 2020-05-10 DIAGNOSIS — R200 Anesthesia of skin: Secondary | ICD-10-CM | POA: Diagnosis not present

## 2020-06-04 ENCOUNTER — Ambulatory Visit: Payer: BC Managed Care – PPO | Admitting: Family Medicine

## 2020-07-17 NOTE — Progress Notes (Signed)
No show

## 2020-07-18 ENCOUNTER — Ambulatory Visit: Payer: BC Managed Care – PPO | Admitting: Family Medicine

## 2020-12-04 NOTE — Progress Notes (Signed)
Name: Katie Hays   MRN: [id number]    DOB: 05-Oct-1981   Date:12/06/2020       Progress Note  Subjective  Chief Complaint  Annual Exam  HPI  Patient presents for annual CPE and follow up - she is aware may get a copay charge   Postcoital bleeding: noticed over the past month, having pain during sex but no cramping Bleeding is immediate after sex and lasts for about one day. Personal history of cervical polyp  Iron deficiency anemia: she has a long history of anemia, worse over the past year, ferritin in the 5-6 , she has intermittent episodes of pica - states it is dirty - not taking iron pills consistently, cycles still heavy but not as heavy as it used to be prior to having children . No family history of colon cancer.   Hyperglycemia: denies polyphagia, polydipsia or polyuria, A1C was 5.7 % in the past but normal last year   Vitamin D deficiency: she forgets to take supplements, she states it would be best if not daily, if low we will send rx vitamin D to pharmacy   AR: she is has nasal congestion, rhinorrhea, post-nasal drainage and mild headache , taking zyrtec without help. Discussed covid testing. She states that is her typical symptoms. She has seen ENT. She tried nasal spray, she is also on singulair. She also saw allergist but all allergy testing was negative. She was supposed to have another procedure but cancelled because of COVID-19. Advised to check back with ENT  Headaches : no longer daily , she took nortriptyline for a time but has been off and has been doing well   History of pneumonia: admitted to [facility] 12/2019 , she had hypoxia, not due to COVID-19. Stayed for 2 days, discussed importance of pneumonia and flu vaccines  Pericarditis: chronic since 2016, had echo done during admission for pneumonia in 2003 or 2004 ( per patient)  , she sees Dr. Clayborn Bigness, no chest pain or palpitatoin  Low back pain: she states she has left upper back pain it happened this past  weekend and is feeling better now. She states is having left hip pain, she states triggered by sitting down and when she stands up it causes left anterior hip pain, she will contact her chiropractor    Diet: cook at home  Exercise: very active   [employer] Visit from 12/06/2020 in [facility]  AUDIT-C Score 1     Depression: Phq 9 is  negative Depression screen [facility] 2/9 12/06/2020 04/16/2020 12/06/2019 02/08/2019 12/03/2018  Decreased Interest 0 0 1 0 0  Down, Depressed, Hopeless 0 0 0 0 0  PHQ - 2 Score 0 0 1 0 0  Altered sleeping 3 0 $R'1 2 1  'pl$ Tired, decreased energy $RemoveBeforeDE'2 1 1 1 3  'tXJzoqBSQcAjgHa$ Change in appetite 0 0 1 0 1  Feeling bad or failure about yourself  0 0 0 0 0  Trouble concentrating 0 0 0 0 0  Moving slowly or fidgety/restless 0 0 0 0 0  Suicidal thoughts 0 0 0 0 0  PHQ-9 Score $RemoveBef'5 1 4 3 5  'SHCPwhGSzJ$ Difficult doing work/chores Not difficult at all Not difficult at all Not difficult at all Not difficult at all Not difficult at all   Hypertension: BP Readings from Last 3 Encounters:  12/06/20 112/68  04/16/20 102/64  01/01/20 (!) 99/47   Obesity: Wt Readings from Last 3 Encounters:  12/06/20 162 lb 4.8 oz (  73.6 kg)  04/16/20 172 lb 14.4 oz (78.4 kg)  12/30/19 170 lb (77.1 kg)   BMI Readings from Last 3 Encounters:  12/06/20 26.20 kg/m  04/16/20 27.91 kg/m  12/30/19 26.63 kg/m     Vaccines:   HPV: up to at age 60 , ask insurance if age between 55-45  Flu:  educated and discussed with patient.  Hep C Screening: 12/07/19 STD testing and prevention (HIV/chl/gon/syphilis): 12/31/19 Intimate partner violence: negative Sexual History : noticing pain during sex, one partner - married.  Menstrual History/LMP/Abnormal Bleeding: regular, heavy and recently with post-coital bleeding , s/p tubal ligation  Incontinence Symptoms: no problems   Breast cancer:  - Last Mammogram: discussed starting it now  - BRCA gene screening: N/A  Osteoporosis: Discussed high calcium  and vitamin D supplementation, weight bearing exercises  Cervical cancer screening: 12/06/19  Skin cancer: Discussed monitoring for atypical lesions  Colorectal cancer: N/A   Lung cancer:   Low Dose CT Chest recommended if Age 19-80 years, 20 pack-year currently smoking OR have quit w/in 15years. Patient does not qualify.   ECG: 01/02/20  Advanced Care Planning: A voluntary discussion about advance care planning including the explanation and discussion of advance directives.  Discussed health care proxy and Living will, and the patient was able to identify a health care proxy as husband .  Patient does not have a living will at present time. Lipids: Lab Results  Component Value Date   CHOL 164 12/07/2019   CHOL 162 12/03/2018   CHOL 172 12/02/2017   Lab Results  Component Value Date   HDL 55 12/07/2019   HDL 59 12/03/2018   HDL 57 12/02/2017   Lab Results  Component Value Date   LDLCALC 96 12/07/2019   LDLCALC 90 12/03/2018   LDLCALC 102 (H) 12/02/2017   Lab Results  Component Value Date   TRIG 45 12/07/2019   TRIG 43 12/03/2018   TRIG 45 12/02/2017   Lab Results  Component Value Date   CHOLHDL 3.0 12/07/2019   CHOLHDL 2.7 12/03/2018   CHOLHDL 3.0 12/02/2017   No results found for: LDLDIRECT  Glucose: Glucose  Date Value Ref Range Status  02/17/2013 88 65 - 99 mg/dL Final  05/13/2012 82 65 - 99 mg/dL Final   Glucose, Bld  Date Value Ref Range Status  01/01/2020 92 70 - 99 mg/dL Final  12/30/2019 94 70 - 99 mg/dL Final  12/07/2019 96 65 - 99 mg/dL Final    Comment:    .            Fasting reference interval .     Patient Active Problem List   Diagnosis Date Noted  . History of pneumonia 12/06/2020  . Hyperglycemia 01/31/2020  . Pericardial effusion 12/31/2019  . Vitamin D deficiency 01/03/2019  . Family history of heart disease in female family member before age 47 07/04/2016  . MVP (mitral valve prolapse) 10/28/2015  . Migraine headache 10/25/2015  .  H/O cesarean section 12/02/2012  . History of blood transfusion 12/02/2012    Past Surgical History:  Procedure Laterality Date  . CESAREAN SECTION  04/30/2016   04/16/2012  . TUBAL LIGATION Bilateral 04/30/2016    Family History  Problem Relation Age of Onset  . Arthritis Mother   . Heart disease Father   . Diabetes Father   . Hypertension Father   . Asthma Sister   . Hypertension Sister   . Diabetes Sister   . Asthma Brother   .  Heart attack Brother   . Asthma Sister   . Hypertension Sister   . Heart attack Sister     Social History   Socioeconomic History  . Marital status: Married    Spouse name: Moses  . Number of children: 2  . Years of education: Not on file  . Highest education level: Bachelor's degree (e.g., BA, AB, BS)  Occupational History  . Occupation: real state appraisal  Tobacco Use  . Smoking status: Never Smoker  . Smokeless tobacco: Never Used  Vaping Use  . Vaping Use: Never used  Substance and Sexual Activity  . Alcohol use: Never    Comment: never  . Drug use: No  . Sexual activity: Yes    Partners: Male    Birth control/protection: Other-see comments    Comment: Tubal Ligation  Other Topics Concern  . 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 Difficulty of Paying Living Expenses: Not hard at all  Food Insecurity: No Food Insecurity  . Worried About Charity fundraiser in the Last Year: Never true  . Ran Out of Food in the Last Year: Never true  Transportation Needs: No Transportation Needs  . Lack of Transportation (Medical): No  . Lack of Transportation (Non-Medical): No  Physical Activity: Sufficiently Active  . Days of Exercise per Week: 3 days  . Minutes of Exercise per Session: 60 min  Stress: Stress Concern Present  . Feeling of Stress : Rather much   Social Connections: Moderately Integrated  . Frequency of Communication with Friends and Family: More than three times a week  . Frequency of Social Gatherings with Friends and Family: More than three times a week  . Attends Religious Services: 1 to 4 times per year  . Active Member of Clubs or Organizations: No  . Attends Archivist Meetings: Not on file  . Marital Status: Married  Human resources officer Violence: Not At Risk  . Fear of Current or Ex-Partner: No  . Emotionally Abused: No  . Physically Abused: No  . Sexually Abused: No     Current Outpatient Medications:  .  cetirizine (ZYRTEC) 10 MG tablet, Take 10 mg by mouth daily., Disp: , Rfl:  .  ferrous sulfate 325 (65 FE) MG tablet, Take 1 tablet (325 mg total) by mouth 3 (three) times daily with meals. Take colace if needed, Disp: 270 tablet, Rfl: 1 .  Multiple Vitamins-Minerals (MULTIVITAMIN GUMMIES WOMENS PO), Take 2 each by mouth daily. VitaFusion Organic, Disp: , Rfl:  .  nortriptyline (PAMELOR) 10 MG capsule, TAKE TWO CAPSULES ( 20 MG) NIGHTLY., Disp: , Rfl:  .  Cholecalciferol 25 MCG (1000 UT) tablet, Take by mouth., Disp: , Rfl:   Allergies  Allergen Reactions  . Dilaudid [Hydromorphone Hcl] Swelling     ROS  Constitutional: Negative for fever, positive for  weight change - lost 8 lbs , she states due to stress .  Respiratory: Negative for cough and shortness of breath.   Cardiovascular: Negative for chest pain or palpitations.  Gastrointestinal: Negative for abdominal pain, no bowel changes.  Musculoskeletal: Negative for gait problem or joint swelling.  Skin: Negative for rash.  Neurological: Negative for dizziness or headache.  No other specific complaints in a complete review of systems (except as listed  in HPI above).  Objective  Vitals:   12/06/20 0927  BP: 112/68  Pulse: 71  Resp: 16  Temp: 98.1 F (36.7 C)  SpO2: 99%  Weight: 162 lb 4.8 oz (73.6 kg)  Height: $Remove'5\' 6"'HZYHYJz$  (1.676 m)    Body  mass index is 26.2 kg/m.  Physical Exam  Constitutional: Patient appears well-developed and well-nourished. No distress.  HENT: Head: Normocephalic and atraumatic. Ears: B TMs ok, no erythema or effusion; Nose: Not done . Mouth/Throat: not done  Eyes: Conjunctivae and EOM are normal. Pupils are equal, round, and reactive to light. No scleral icterus.  Neck: Normal range of motion. Neck supple. No JVD present. No thyromegaly present.  Cardiovascular: Normal rate, regular rhythm and normal heart sounds.  No murmur heard. No BLE edema. Pulmonary/Chest: Effort normal and breath sounds normal. No respiratory distress. Abdominal: Soft. Bowel sounds are normal, no distension. There is no tenderness. no masses Breast: no lumps or masses, no nipple discharge or rashes FEMALE GENITALIA:  External genitalia normal External urethra normal Vaginal vault normal without discharge or lesions Cervix : small cervical polyp, it was removed last year almost 2 cm in size, and it is back, advised to contact gyn  Bimanual exam normal without masses RECTAL: not done  Musculoskeletal: Normal range of motion, no joint effusions. No gross deformities Neurological: he is alert and oriented to person, place, and time. No cranial nerve deficit. Coordination, balance, strength, speech and gait are normal.  Skin: Skin is warm and dry. No rash noted. No erythema.  Psychiatric: Patient has a normal mood and affect. behavior is normal. Judgment and thought content normal.  Fall Risk: Fall Risk  12/06/2020 04/16/2020 12/06/2019 02/08/2019 12/03/2018  Falls in the past year? 0 0 0 0 0  Number falls in past yr: 0 0 0 0 0  Injury with Fall? 0 0 0 0 0     Functional Status Survey: Is the patient deaf or have difficulty hearing?: No Does the patient have difficulty seeing, even when wearing glasses/contacts?: No Does the patient have difficulty concentrating, remembering, or making decisions?: No Does the patient have  difficulty walking or climbing stairs?: No Does the patient have difficulty dressing or bathing?: No Does the patient have difficulty doing errands alone such as visiting a doctor's office or shopping?: No   Assessment & Plan  1. Well adult exam  - COMPLETE METABOLIC PANEL WITH GFR - CBC with Differential/Platelet - Iron, TIBC and Ferritin Panel - VITAMIN D 25 Hydroxy (Vit-D Deficiency, Fractures) - Lipid panel - Cytology - PAP  2. Breast cancer screening by mammogram  - MM 3D SCREEN BREAST BILATERAL; Future  3. Cervical cancer screening  - Cytology - PAP  4. Need for immunization against influenza  - Flu Vaccine QUAD 36+ mos IM  5. PCB (post coital bleeding)   6. Hyperglycemia  hgbA1C  7. History of pneumonia   8. Pericardial effusion  - Sedimentation rate - C-reactive protein  9. Iron deficiency anemia due to chronic blood loss  - CBC with Differential/Platelet - Iron, TIBC and Ferritin Panel  10. Insomnia, unspecified type  Taking Zquill, discussed melatonin   11. Need for pneumococcal vaccine  - Pneumococcal polysaccharide vaccine 23-valent greater than or equal to 2yo subcutaneous/IM  12. 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

## 2020-12-04 NOTE — Patient Instructions (Addendum)
Call insurance about coverage of HPV vaccine for you Call GYN for cervical polyp removal Call [facility] to schedule mammogram [phone]    Preventive Care 71-42 Years Old, Female Preventive care refers to lifestyle choices and visits with your health care provider that can promote health and wellness. This includes:  A yearly physical exam. This is also called an annual wellness visit.  Regular dental and eye exams.  Immunizations.  Screening for certain conditions.  Healthy lifestyle choices, such as: ? Eating a healthy diet. ? Getting regular exercise. ? Not using drugs or products that contain nicotine and tobacco. ? Limiting alcohol use. What can I expect for my preventive care visit? Physical exam Your health care provider may check your:  Height and weight. These may be used to calculate your BMI (body mass index). BMI is a measurement that tells if you are at a healthy weight.  Heart rate and blood pressure.  Body temperature.  Skin for abnormal spots. Counseling Your health care provider may ask you questions about your:  Past medical problems.  Family's medical history.  Alcohol, tobacco, and drug use.  Emotional well-being.  Home life and relationship well-being.  Sexual activity.  Diet, exercise, and sleep habits.  Work and work Statistician.  Access to firearms.  Method of birth control.  Menstrual cycle.  Pregnancy history. What immunizations do I need? Vaccines are usually given at various ages, according to a schedule. Your health care provider will recommend vaccines for you based on your age, medical history, and lifestyle or other factors, such as travel or where you work.   What tests do I need? Blood tests  Lipid and cholesterol levels. These may be checked every 5 years starting at age 42.  Hepatitis C test.  Hepatitis B test. Screening  Diabetes screening. This is done by checking your blood sugar (glucose)  after you have not eaten for a while (fasting).  STD (sexually transmitted disease) testing, if you are at risk.  BRCA-related cancer screening. This may be done if you have a family history of breast, ovarian, tubal, or peritoneal cancers.  Pelvic exam and Pap test. This may be done every 3 years starting at age 32. Starting at age 31, this may be done every 5 years if you have a Pap test in combination with an HPV test. Talk with your health care provider about your test results, treatment options, and if necessary, the need for more tests.   Follow these instructions at home: Eating and drinking  Eat a healthy diet that includes fresh fruits and vegetables, whole grains, lean protein, and low-fat dairy products.  Take vitamin and mineral supplements as recommended by your health care provider.  Do not drink alcohol if: ? Your health care provider tells you not to drink. ? You are pregnant, may be pregnant, or are planning to become pregnant.  If you drink alcohol: ? Limit how much you have to 0-1 drink a day. ? Be aware of how much alcohol is in your drink. In the [place] one drink equals one 12 oz bottle of beer (355 mL), one 5 oz glass of wine (148 mL), or one 1 oz glass of hard liquor (44 mL).   Lifestyle  Take daily care of your teeth and gums. Brush your teeth every morning and night with fluoride toothpaste. Floss one time each day.  Stay active. Exercise for at least 30 minutes 5 or more days each week.  Do not use any  products that contain nicotine or tobacco, such as cigarettes, e-cigarettes, and chewing tobacco. If you need help quitting, ask your health care provider.  Do not use drugs.  If you are sexually active, practice safe sex. Use a condom or other form of protection to prevent STIs (sexually transmitted infections).  If you do not wish to become pregnant, use a form of birth control. If you plan to become pregnant, see your health care provider for a  prepregnancy visit.  Find healthy ways to cope with stress, such as: ? Meditation, yoga, or listening to music. ? Journaling. ? Talking to a trusted person. ? Spending time with friends and family. Safety  Always wear your seat belt while driving or riding in a vehicle.  Do not drive: ? If you have been drinking alcohol. Do not ride with someone who has been drinking. ? When you are tired or distracted. ? While texting.  Wear a helmet and other protective equipment during sports activities.  If you have firearms in your house, make sure you follow all gun safety procedures.  Seek help if you have been physically or sexually abused. What's next?  Go to your health care provider once a year for an annual wellness visit.  Ask your health care provider how often you should have your eyes and teeth checked.  Stay up to date on all vaccines. This information is not intended to replace advice given to you by your health care provider. Make sure you discuss any questions you have with your health care provider. Document Revised: 07/08/2020 Document Reviewed: 07/22/2018 Elsevier Patient Education  2021 [employer].

## 2020-12-06 ENCOUNTER — Encounter: Payer: Self-pay | Admitting: Family Medicine

## 2020-12-06 ENCOUNTER — Ambulatory Visit (INDEPENDENT_AMBULATORY_CARE_PROVIDER_SITE_OTHER): Payer: No Typology Code available for payment source | Admitting: Family Medicine

## 2020-12-06 ENCOUNTER — Other Ambulatory Visit: Payer: Self-pay

## 2020-12-06 ENCOUNTER — Other Ambulatory Visit (HOSPITAL_COMMUNITY)
Admission: RE | Admit: 2020-12-06 | Discharge: 2020-12-06 | Disposition: A | Discharge status: Home / Self Care | Payer: BC Managed Care – PPO | Source: Ambulatory Visit | Attending: Family Medicine | Admitting: Family Medicine

## 2020-12-06 VITALS — BP 112/68 | HR 71 | Temp 98.1°F | Resp 16 | Ht 66.0 in | Wt 162.3 lb

## 2020-12-06 DIAGNOSIS — Z1231 Encounter for screening mammogram for malignant neoplasm of breast: Secondary | ICD-10-CM | POA: Diagnosis not present

## 2020-12-06 DIAGNOSIS — Z124 Encounter for screening for malignant neoplasm of cervix: Secondary | ICD-10-CM | POA: Diagnosis not present

## 2020-12-06 DIAGNOSIS — R739 Hyperglycemia, unspecified: Secondary | ICD-10-CM

## 2020-12-06 DIAGNOSIS — Z0000 Encounter for general adult medical examination without abnormal findings: Secondary | ICD-10-CM | POA: Insufficient documentation

## 2020-12-06 DIAGNOSIS — Z8701 Personal history of pneumonia (recurrent): Secondary | ICD-10-CM

## 2020-12-06 DIAGNOSIS — Z23 Encounter for immunization: Secondary | ICD-10-CM | POA: Diagnosis not present

## 2020-12-06 DIAGNOSIS — N841 Polyp of cervix uteri: Secondary | ICD-10-CM

## 2020-12-06 DIAGNOSIS — I3139 Other pericardial effusion (noninflammatory): Secondary | ICD-10-CM

## 2020-12-06 DIAGNOSIS — N930 Postcoital and contact bleeding: Secondary | ICD-10-CM

## 2020-12-06 DIAGNOSIS — D500 Iron deficiency anemia secondary to blood loss (chronic): Secondary | ICD-10-CM

## 2020-12-06 DIAGNOSIS — G4700 Insomnia, unspecified: Secondary | ICD-10-CM

## 2020-12-06 DIAGNOSIS — I313 Pericardial effusion (noninflammatory): Secondary | ICD-10-CM

## 2020-12-07 LAB — C-REACTIVE PROTEIN: CRP: 0.6 mg/L (ref <8.0)

## 2020-12-07 LAB — CBC WITH DIFFERENTIAL/PLATELET
Absolute Monocytes: 320 cells/uL (ref 200–950)
Basophils Absolute: 51 cells/uL (ref 0–200)
Basophils Relative: 1.3 %
Eosinophils Absolute: 148 cells/uL (ref 15–500)
Eosinophils Relative: 3.8 %
HCT: 32.5 % — ABNORMAL LOW (ref 35.0–45.0)
Hemoglobin: 10 g/dL — ABNORMAL LOW (ref 11.7–15.5)
Lymphs Abs: 1509 cells/uL (ref 850–3900)
MCH: 25.2 pg — ABNORMAL LOW (ref 27.0–33.0)
MCHC: 30.8 g/dL — ABNORMAL LOW (ref 32.0–36.0)
MCV: 81.9 fL (ref 80.0–100.0)
MPV: 9.2 fL (ref 7.5–12.5)
Monocytes Relative: 8.2 %
Neutro Abs: 1872 cells/uL (ref 1500–7800)
Neutrophils Relative %: 48 %
Platelets: 349 10*3/uL (ref 140–400)
RBC: 3.97 10*6/uL (ref 3.80–5.10)
RDW: 15.1 % — ABNORMAL HIGH (ref 11.0–15.0)
Total Lymphocyte: 38.7 %
WBC: 3.9 10*3/uL (ref 3.8–10.8)

## 2020-12-07 LAB — HEMOGLOBIN A1C
Hgb A1c MFr Bld: 5.5 % of total Hgb (ref <5.7)
Mean Plasma Glucose: 111 mg/dL
eAG (mmol/L): 6.2 mmol/L

## 2020-12-07 LAB — CYTOLOGY - PAP
Chlamydia: NEGATIVE
Comment: NEGATIVE
Comment: NEGATIVE
Comment: NORMAL
Diagnosis: NEGATIVE
High risk HPV: POSITIVE — AB
Neisseria Gonorrhea: NEGATIVE

## 2020-12-07 LAB — IRON,TIBC AND FERRITIN PANEL
%SAT: 14 % (calc) — ABNORMAL LOW (ref 16–45)
Ferritin: 2 ng/mL — ABNORMAL LOW (ref 16–232)
Iron: 56 ug/dL (ref 40–190)
TIBC: 389 mcg/dL (calc) (ref 250–450)

## 2020-12-07 LAB — COMPLETE METABOLIC PANEL WITH GFR
AG Ratio: 1.3 (calc) (ref 1.0–2.5)
ALT: 10 U/L (ref 6–29)
AST: 18 U/L (ref 10–30)
Albumin: 3.9 g/dL (ref 3.6–5.1)
Alkaline phosphatase (APISO): 46 U/L (ref 31–125)
BUN: 10 mg/dL (ref 7–25)
CO2: 24 mmol/L (ref 20–32)
Calcium: 9.1 mg/dL (ref 8.6–10.2)
Chloride: 106 mmol/L (ref 98–110)
Creat: 0.73 mg/dL (ref 0.50–1.10)
GFR, Est African American: 119 mL/min/{1.73_m2} (ref >=60)
GFR, Est Non African American: 102 mL/min/{1.73_m2} (ref >=60)
Globulin: 2.9 g/dL (calc) (ref 1.9–3.7)
Glucose, Bld: 78 mg/dL (ref 65–99)
Potassium: 4.2 mmol/L (ref 3.5–5.3)
Sodium: 138 mmol/L (ref 135–146)
Total Bilirubin: 0.4 mg/dL (ref 0.2–1.2)
Total Protein: 6.8 g/dL (ref 6.1–8.1)

## 2020-12-07 LAB — LIPID PANEL
Cholesterol: 161 mg/dL (ref <200)
HDL: 64 mg/dL (ref >=50)
LDL Cholesterol (Calc): 85 mg/dL (calc)
Non-HDL Cholesterol (Calc): 97 mg/dL (calc) (ref <130)
Total CHOL/HDL Ratio: 2.5 (calc) (ref <5.0)
Triglycerides: 42 mg/dL (ref <150)

## 2020-12-07 LAB — SEDIMENTATION RATE: Sed Rate: 11 mm/h (ref 0–20)

## 2020-12-07 LAB — VITAMIN D 25 HYDROXY (VIT D DEFICIENCY, FRACTURES): Vit D, 25-Hydroxy: 17 ng/mL — ABNORMAL LOW (ref 30–100)

## 2020-12-19 ENCOUNTER — Ambulatory Visit: Payer: BC Managed Care – PPO | Admitting: Family Medicine

## 2021-12-09 ENCOUNTER — Encounter: Payer: BC Managed Care – PPO | Admitting: Family Medicine

## 2021-12-09 NOTE — Progress Notes (Signed)
Name: Katie Hays   MRN: [id number]    DOB: [birth year]   Date:12/10/2021       Progress Note  Subjective  Chief Complaint  Annual Exam  HPI  Patient presents for annual CPE and follow up - she is aware may get a copay charge    Iron deficiency anemia: she has a long history of anemia, she tries to take ferrous sulfate on regular basis but tired of taking multiple pills daily, last ferritin down to 2 she has intermittent episodes of pica - states it is dirty,  cycles still heavy , lasts 7 days and heavy for 5 days . Discussed OCP or IUD to stop bleeding but she does not want the hormones, discussed endometrial ablation. She is willing to see hematologist and GYN to discuss options  No family history of colon cancer.    Hyperglycemia: denies polyphagia, polydipsia or polyuria, A1C was 5.7 % a few years ago, but normal since     Vitamin D deficiency: she is taking woman's vitamin but currently not taking otc vitamin D    AR: she is has nasal congestion, rhinorrhea, post-nasal drainage and mild headache. She has seen ENT. She tried nasal spray, she is also on singulair. She also saw allergist but all allergy testing was negative. Symptoms unchanged She would like to go see another allergist for a second opinion in [place]    History of pneumonia: admitted to [facility] 12/2019 , she had hypoxia, not due to COVID-19. Stayed for 2 days, she had pneumonia vaccine last year, flu shot today    Pericarditis: chronic since 2016, had echo done during admission for pneumonia in 2003 or 2004 ( per patient)  , she sees Dr. Clayborn Bigness, no chest pain or palpitation, denies orthopnea.    Diet: balanced  Exercise: discussed 150 minutes per week    [place] Office Visit from 12/10/2021 [facility]  AUDIT-C Score 0      Depression: Phq 9 is  negative Depression screen [facility] 2/9 12/10/2021 12/06/2020 04/16/2020 12/06/2019 02/08/2019  Decreased Interest 0 0 0 1 0  Down, Depressed,  Hopeless 0 0 0 0 0  PHQ - 2 Score 0 0 0 1 0  Altered sleeping 3 3 0 1 2  Tired, decreased energy $RemoveBeforeDE'3 2 1 1 1  'JTXkgwfvLHxbolp$ Change in appetite 3 0 0 1 0  Feeling bad or failure about yourself  0 0 0 0 0  Trouble concentrating 0 0 0 0 0  Moving slowly or fidgety/restless 0 0 0 0 0  Suicidal thoughts 0 0 0 0 0  PHQ-9 Score $RemoveBef'9 5 1 4 3  'qsbcnnfcLE$ Difficult doing work/chores - Not difficult at all Not difficult at all Not difficult at all Not difficult at all   Hypertension: BP Readings from Last 3 Encounters:  12/10/21 104/64  12/06/20 112/68  04/16/20 102/64   Obesity: Wt Readings from Last 3 Encounters:  12/10/21 164 lb (74.4 kg)  12/06/20 162 lb 4.8 oz (73.6 kg)  04/16/20 172 lb 14.4 oz (78.4 kg)   BMI Readings from Last 3 Encounters:  12/10/21 26.47 kg/m  12/06/20 26.20 kg/m  04/16/20 27.91 kg/m     Vaccines:   HPV vaccine: reminded patient to call insurance  COVID-19: advised booster  Pneumonia: educated and discussed with patient. Flu: educated and discussed with patient. Today   Hep C Screening: 12/25/19 STD testing and prevention (HIV/chl/gon/syphilis): 12/31/19 Intimate partner violence: negative Sexual History : married , one partner ,  denies post coital bleeding  Menstrual History/LMP/Abnormal Bleeding: s/p tubal ligation  Bladder: no symptoms of incontinence   Breast cancer:  - Last Mammogram: order placed  - BRCA gene screening: N/A  Osteoporosis prevention : Discussed high calcium and vitamin D supplementation, weight bearing exercises  Cervical cancer screening: 12/06/20  Skin cancer: Discussed monitoring for atypical lesions  Colorectal cancer: consider having it done at age 13  Lung cancer: Low Dose CT Chest recommended if Age 35-80 years, 20 pack-year currently smoking OR have quit w/in 15years. Patient does not qualify.   ECG: 01/02/20  Advanced Care Planning: A voluntary discussion about advance care planning including the explanation and discussion of advance directives.   Discussed health care proxy and Living will, and the patient was able to identify a health care proxy as husband .  Patient does have a living will at present time.   Lipids: Lab Results  Component Value Date   CHOL 161 12/06/2020   CHOL 164 12/07/2019   CHOL 162 12/03/2018   Lab Results  Component Value Date   HDL 64 12/06/2020   HDL 55 12/07/2019   HDL 59 12/03/2018   Lab Results  Component Value Date   LDLCALC 85 12/06/2020   LDLCALC 96 12/07/2019   LDLCALC 90 12/03/2018   Lab Results  Component Value Date   TRIG 42 12/06/2020   TRIG 45 12/07/2019   TRIG 43 12/03/2018   Lab Results  Component Value Date   CHOLHDL 2.5 12/06/2020   CHOLHDL 3.0 12/07/2019   CHOLHDL 2.7 12/03/2018   No results found for: LDLDIRECT  Glucose: Glucose  Date Value Ref Range Status  02/17/2013 88 65 - 99 mg/dL Final  05/13/2012 82 65 - 99 mg/dL Final   Glucose, Bld  Date Value Ref Range Status  12/06/2020 78 65 - 99 mg/dL Final    Comment:    .            Fasting reference interval .   01/01/2020 92 70 - 99 mg/dL Final  12/30/2019 94 70 - 99 mg/dL Final    Patient Active Problem List   Diagnosis Date Noted   History of pneumonia 12/06/2020   Hyperglycemia 01/31/2020   Pericardial effusion 12/31/2019   Vitamin D deficiency 01/03/2019   Family history of heart disease in female family member before age 58 07/04/2016   MVP (mitral valve prolapse) 10/28/2015   Migraine headache 10/25/2015   H/O cesarean section 12/02/2012   History of blood transfusion 12/02/2012    Past Surgical History:  Procedure Laterality Date   CESAREAN SECTION  04/30/2016   04/16/2012   TUBAL LIGATION Bilateral 04/30/2016    Family History  Problem Relation Age of Onset   Arthritis Mother    Heart disease Father    Diabetes Father    Hypertension Father    Asthma Sister    Hypertension Sister    Diabetes Sister    Asthma Brother    Heart attack Brother    Asthma Sister    Hypertension  Sister    Heart attack Sister     Social History   Socioeconomic History   Marital status: Married    Spouse name: Gershon Mussel   Number of children: 2   Years of education: Not on file   Highest education level: Bachelor's degree (e.g., BA, AB, BS)  Occupational History   Occupation: real state appraisal  Tobacco Use   Smoking status: Never   Smokeless tobacco: Never  Vaping Use  Vaping Use: Never used  Substance and Sexual Activity   Alcohol use: Never    Comment: never   Drug use: No   Sexual activity: Yes    Partners: Male    Birth control/protection: Other-see comments    Comment: Tubal Ligation  Other Topics Concern   Not on file  Social History Narrative   Married and has two children, last one born in 2017    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   Social Determinants of Radio broadcast assistant Strain: Low Risk    Difficulty of Paying Living Expenses: Not hard at all  Food Insecurity: No Food Insecurity   Worried About Charity fundraiser in the Last Year: Never true   Arboriculturist in the Last Year: Never true  Transportation Needs: No Transportation Needs   Lack of Transportation (Medical): No   Lack of Transportation (Non-Medical): No  Physical Activity: Insufficiently Active   Days of Exercise per Week: 3 days   Minutes of Exercise per Session: 40 min  Stress: Stress Concern Present   Feeling of Stress : Very much  Social Connections: Socially Integrated   Frequency of Communication with Friends and Family: More than three times a week   Frequency of Social Gatherings with Friends and Family: Once a week   Attends Religious Services: More than 4 times per year   Active Member of [employer] or Organizations: Yes   Attends Archivist Meetings: 1 to 4 times per year   Marital Status: Married  Human resources officer Violence: Not At Risk   Fear of Current or Ex-Partner: No   Emotionally Abused: No   Physically Abused: No   Sexually Abused: No      Current Outpatient Medications:    Cholecalciferol 25 MCG (1000 UT) tablet, Take by mouth., Disp: , Rfl:    ferrous sulfate 325 (65 FE) MG tablet, Take 1 tablet (325 mg total) by mouth 3 (three) times daily with meals. Take colace if needed, Disp: 270 tablet, Rfl: 1   Multiple Vitamins-Minerals (MULTIVITAMIN GUMMIES WOMENS PO), Take 2 each by mouth daily. VitaFusion Organic, Disp: , Rfl:   Allergies  Allergen Reactions   Dilaudid [Hydromorphone Hcl] Swelling     ROS  Constitutional: Negative for fever or weight change. She has noticed fatigue  Respiratory: Negative for cough and shortness of breath.   Cardiovascular: Negative for chest pain or palpitations.  Gastrointestinal: Negative for abdominal pain, no bowel changes.  Musculoskeletal: Negative for gait problem or joint swelling.  Skin: Negative for rash.  Neurological: Negative for dizziness or headache.  No other specific complaints in a complete review of systems (except as listed in HPI above).   Objective  Vitals:   12/10/21 0906  BP: 104/64  Pulse: 71  Resp: 16  Temp: 98.1 F (36.7 C)  SpO2: 98%  Weight: 164 lb (74.4 kg)  Height: $Remove'5\' 6"'plovPOb$  (1.676 m)    Body mass index is 26.47 kg/m.  Physical Exam  Constitutional: Patient appears well-developed and well-nourished. No distress.  HENT: Head: Normocephalic and atraumatic. Ears: B TMs ok, no erythema or effusion; Nose: Not done  Mouth/Throat: not done  Eyes: Conjunctivae and EOM are normal. Pupils are equal, round, and reactive to light. No scleral icterus.  Neck: Normal range of motion. Neck supple. No JVD present. No thyromegaly present.  Cardiovascular: Normal rate, regular rhythm and normal heart sounds.  No murmur heard. No BLE edema. Pulmonary/Chest: Effort normal and breath  sounds normal. No respiratory distress. Abdominal: Soft. Bowel sounds are normal, no distension. There is no tenderness. no masses Breast: no lumps or masses, no nipple discharge or  rashes FEMALE GENITALIA:  Not done  RECTAL: not done  Musculoskeletal: Normal range of motion, no joint effusions. No gross deformities Neurological: he is alert and oriented to person, place, and time. No cranial nerve deficit. Coordination, balance, strength, speech and gait are normal.  Skin: Skin is warm and dry. No rash noted. No erythema.  Psychiatric: Patient has a normal mood and affect. behavior is normal. Judgment and thought content normal.   Fall Risk: Fall Risk  12/10/2021 12/06/2020 04/16/2020 12/06/2019 02/08/2019  Falls in the past year? 0 0 0 0 0  Number falls in past yr: 0 0 0 0 0  Injury with Fall? 0 0 0 0 0  Risk for fall due to : No Fall Risks - - - -  Follow up Falls prevention discussed - - - -     Functional Status Survey: Is the patient deaf or have difficulty hearing?: No Does the patient have difficulty seeing, even when wearing glasses/contacts?: No Does the patient have difficulty concentrating, remembering, or making decisions?: No Does the patient have difficulty walking or climbing stairs?: No Does the patient have difficulty dressing or bathing?: No Does the patient have difficulty doing errands alone such as visiting a doctor's office or shopping?: No   Assessment & Plan  1. Well adult exam  - MM 3D SCREEN BREAST BILATERAL; Future - Lipid panel - CBC with Differential/Platelet - COMPLETE METABOLIC PANEL WITH GFR - Hemoglobin A1c - Iron, TIBC and Ferritin Panel - VITAMIN D 25 Hydroxy (Vit-D Deficiency, Fractures)  2. Need for immunization against influenza  - Flu Vaccine QUAD 6+ mos PF IM (Fluarix Quad PF)  3. Iron deficiency anemia due to chronic blood loss  - CBC with Differential/Platelet - Iron, TIBC and Ferritin Panel - Ambulatory referral to Hematology / Oncology  4. Breast cancer screening by mammogram  - MM 3D SCREEN BREAST BILATERAL; Future  5. Insomnia, unspecified type  She is stopping working at 8 pm to help the nighttime  awakening   6. Hyperglycemia  - Hemoglobin A1c  7. Lipid screening  - Lipid panel  8. Vitamin D deficiency  - VITAMIN D 25 Hydroxy (Vit-D Deficiency, Fractures)  9. Low serum vitamin B12  - B12 and Folate Panel  10. Menorrhagia with regular cycle  - Ambulatory referral to Obstetrics / Gynecology  11. Non-seasonal allergic rhinitis due to other allergic trigger  - Ambulatory referral to Allergy   12. Other fatigue  - TSH   -USPSTF grade A and B recommendations reviewed with patient; age-appropriate recommendations, preventive care, screening tests, etc discussed and encouraged; healthy living encouraged; see AVS for patient education given to patient -Discussed importance of 150 minutes of physical activity weekly, eat two servings of fish weekly, eat one serving of tree nuts ( cashews, pistachios, pecans, almonds.Marland Kitchen) every other day, eat 6 servings of fruit/vegetables daily and drink plenty of water and avoid sweet beverages.

## 2021-12-10 ENCOUNTER — Encounter: Payer: Self-pay | Admitting: Family Medicine

## 2021-12-10 ENCOUNTER — Ambulatory Visit (INDEPENDENT_AMBULATORY_CARE_PROVIDER_SITE_OTHER): Payer: No Typology Code available for payment source | Admitting: Family Medicine

## 2021-12-10 ENCOUNTER — Telehealth: Payer: Self-pay

## 2021-12-10 VITALS — BP 104/64 | HR 71 | Temp 98.1°F | Resp 16 | Ht 66.0 in | Wt 164.0 lb

## 2021-12-10 DIAGNOSIS — G4700 Insomnia, unspecified: Secondary | ICD-10-CM

## 2021-12-10 DIAGNOSIS — D500 Iron deficiency anemia secondary to blood loss (chronic): Secondary | ICD-10-CM

## 2021-12-10 DIAGNOSIS — R5383 Other fatigue: Secondary | ICD-10-CM

## 2021-12-10 DIAGNOSIS — N920 Excessive and frequent menstruation with regular cycle: Secondary | ICD-10-CM

## 2021-12-10 DIAGNOSIS — E538 Deficiency of other specified B group vitamins: Secondary | ICD-10-CM

## 2021-12-10 DIAGNOSIS — Z23 Encounter for immunization: Secondary | ICD-10-CM | POA: Diagnosis not present

## 2021-12-10 DIAGNOSIS — Z13220 Encounter for screening for lipoid disorders: Secondary | ICD-10-CM

## 2021-12-10 DIAGNOSIS — Z1231 Encounter for screening mammogram for malignant neoplasm of breast: Secondary | ICD-10-CM

## 2021-12-10 DIAGNOSIS — J3089 Other allergic rhinitis: Secondary | ICD-10-CM

## 2021-12-10 DIAGNOSIS — Z0000 Encounter for general adult medical examination without abnormal findings: Secondary | ICD-10-CM | POA: Diagnosis not present

## 2021-12-10 DIAGNOSIS — R739 Hyperglycemia, unspecified: Secondary | ICD-10-CM

## 2021-12-10 DIAGNOSIS — E559 Vitamin D deficiency, unspecified: Secondary | ICD-10-CM | POA: Diagnosis not present

## 2021-12-10 LAB — TSH: TSH: 1.77 mIU/L

## 2021-12-10 LAB — CBC WITH DIFFERENTIAL/PLATELET
Absolute Monocytes: 310 cells/uL (ref 200–950)
Basophils Absolute: 40 cells/uL (ref 0–200)
Basophils Relative: 1.3 %
Eosinophils Absolute: 220 cells/uL (ref 15–500)
Eosinophils Relative: 7.1 %
HCT: 31.9 % — ABNORMAL LOW (ref 35.0–45.0)
Hemoglobin: 9.8 g/dL — ABNORMAL LOW (ref 11.7–15.5)
Lymphs Abs: 1615 cells/uL (ref 850–3900)
MCH: 24.7 pg — ABNORMAL LOW (ref 27.0–33.0)
MCHC: 30.7 g/dL — ABNORMAL LOW (ref 32.0–36.0)
MCV: 80.6 fL (ref 80.0–100.0)
MPV: 9.1 fL (ref 7.5–12.5)
Monocytes Relative: 10 %
Neutro Abs: 915 cells/uL — ABNORMAL LOW (ref 1500–7800)
Neutrophils Relative %: 29.5 %
Platelets: 332 10*3/uL (ref 140–400)
RBC: 3.96 10*6/uL (ref 3.80–5.10)
RDW: 15.2 % — ABNORMAL HIGH (ref 11.0–15.0)
Total Lymphocyte: 52.1 %
WBC: 3.1 10*3/uL — ABNORMAL LOW (ref 3.8–10.8)

## 2021-12-10 LAB — COMPLETE METABOLIC PANEL WITH GFR
AG Ratio: 1.3 (calc) (ref 1.0–2.5)
ALT: 15 U/L (ref 6–29)
AST: 19 U/L (ref 10–30)
Albumin: 4.2 g/dL (ref 3.6–5.1)
Alkaline phosphatase (APISO): 47 U/L (ref 31–125)
BUN: 9 mg/dL (ref 7–25)
CO2: 26 mmol/L (ref 20–32)
Calcium: 9.3 mg/dL (ref 8.6–10.2)
Chloride: 107 mmol/L (ref 98–110)
Creat: 0.82 mg/dL (ref 0.50–0.99)
Globulin: 3.2 g/dL (calc) (ref 1.9–3.7)
Glucose, Bld: 86 mg/dL (ref 65–99)
Potassium: 4.3 mmol/L (ref 3.5–5.3)
Sodium: 139 mmol/L (ref 135–146)
Total Bilirubin: 0.4 mg/dL (ref 0.2–1.2)
Total Protein: 7.4 g/dL (ref 6.1–8.1)
eGFR: 92 mL/min/{1.73_m2} (ref >=60)

## 2021-12-10 LAB — IRON,TIBC AND FERRITIN PANEL
%SAT: 9 % (calc) — ABNORMAL LOW (ref 16–45)
Ferritin: 3 ng/mL — ABNORMAL LOW (ref 16–232)
Iron: 35 ug/dL — ABNORMAL LOW (ref 40–190)
TIBC: 401 mcg/dL (calc) (ref 250–450)

## 2021-12-10 LAB — HEMOGLOBIN A1C
Hgb A1c MFr Bld: 5.7 % of total Hgb — ABNORMAL HIGH (ref <5.7)
Mean Plasma Glucose: 117 mg/dL
eAG (mmol/L): 6.5 mmol/L

## 2021-12-10 LAB — LIPID PANEL
Cholesterol: 160 mg/dL (ref <200)
HDL: 68 mg/dL (ref >=50)
LDL Cholesterol (Calc): 81 mg/dL (calc)
Non-HDL Cholesterol (Calc): 92 mg/dL (calc) (ref <130)
Total CHOL/HDL Ratio: 2.4 (calc) (ref <5.0)
Triglycerides: 38 mg/dL (ref <150)

## 2021-12-10 LAB — B12 AND FOLATE PANEL
Folate: >24 ng/mL
Vitamin B-12: 824 pg/mL (ref 200–1100)

## 2021-12-10 LAB — VITAMIN D 25 HYDROXY (VIT D DEFICIENCY, FRACTURES): Vit D, 25-Hydroxy: 24 ng/mL — ABNORMAL LOW (ref 30–100)

## 2021-12-10 NOTE — Telephone Encounter (Signed)
[facility] referring for menorrhagia and iron deficiency anemia, contemplating endometrial ablation. MD only Called and left voicemail for patient to call back to be scheduled.

## 2021-12-10 NOTE — Patient Instructions (Addendum)
Call insurance to find out about HPV coverage  Preventive Care 60-43 Years Old, Female Preventive care refers to lifestyle choices and visits with your health care provider that can promote health and wellness. Preventive care visits are also called wellness exams. What can I expect for my preventive care visit? Counseling Your health care provider may ask you questions about your: Medical history, including: Past medical problems. Family medical history. Pregnancy history. Current health, including: Menstrual cycle. Method of birth control. Emotional well-being. Home life and relationship well-being. Sexual activity and sexual health. Lifestyle, including: Alcohol, nicotine or tobacco, and drug use. Access to firearms. Diet, exercise, and sleep habits. Work and work Statistician. Sunscreen use. Safety issues such as seatbelt and bike helmet use. Physical exam Your health care provider will check your: Height and weight. These may be used to calculate your BMI (body mass index). BMI is a measurement that tells if you are at a healthy weight. Waist circumference. This measures the distance around your waistline. This measurement also tells if you are at a healthy weight and may help predict your risk of certain diseases, such as type 2 diabetes and high blood pressure. Heart rate and blood pressure. Body temperature. Skin for abnormal spots. What immunizations do I need? Vaccines are usually given at various ages, according to a schedule. Your health care provider will recommend vaccines for you based on your age, medical history, and lifestyle or other factors, such as travel or where you work. What tests do I need? Screening Your health care provider may recommend screening tests for certain conditions. This may include: Lipid and cholesterol levels. Diabetes screening. This is done by checking your blood sugar (glucose) after you have not eaten for a while (fasting). Pelvic  exam and Pap test. Hepatitis B test. Hepatitis C test. HIV (human immunodeficiency virus) test. STI (sexually transmitted infection) testing, if you are at risk. Lung cancer screening. Colorectal cancer screening. Mammogram. Talk with your health care provider about when you should start having regular mammograms. This may depend on whether you have a family history of breast cancer. BRCA-related cancer screening. This may be done if you have a family history of breast, ovarian, tubal, or peritoneal cancers. Bone density scan. This is done to screen for osteoporosis. Talk with your health care provider about your test results, treatment options, and if necessary, the need for more tests. Follow these instructions at home: Eating and drinking  Eat a diet that includes fresh fruits and vegetables, whole grains, lean protein, and low-fat dairy products. Take vitamin and mineral supplements as recommended by your health care provider. Do not drink alcohol if: Your health care provider tells you not to drink. You are pregnant, may be pregnant, or are planning to become pregnant. If you drink alcohol: Limit how much you have to 0-1 drink a day. Know how much alcohol is in your drink. In the [place] one drink equals one 12 oz bottle of beer (355 mL), one 5 oz glass of wine (148 mL), or one 1 oz glass of hard liquor (44 mL). Lifestyle Brush your teeth every morning and night with fluoride toothpaste. Floss one time each day. Exercise for at least 30 minutes 5 or more days each week. Do not use any products that contain nicotine or tobacco. These products include cigarettes, chewing tobacco, and vaping devices, such as e-cigarettes. If you need help quitting, ask your health care provider. Do not use drugs. If you are sexually active, practice safe sex. Use  a condom or other form of protection to prevent STIs. If you do not wish to become pregnant, use a form of birth control. If you plan to become  pregnant, see your health care provider for a prepregnancy visit. Take aspirin only as told by your health care provider. Make sure that you understand how much to take and what form to take. Work with your health care provider to find out whether it is safe and beneficial for you to take aspirin daily. Find healthy ways to manage stress, such as: Meditation, yoga, or listening to music. Journaling. Talking to a trusted person. Spending time with friends and family. Minimize exposure to UV radiation to reduce your risk of skin cancer. Safety Always wear your seat belt while driving or riding in a vehicle. Do not drive: If you have been drinking alcohol. Do not ride with someone who has been drinking. When you are tired or distracted. While texting. If you have been using any mind-altering substances or drugs. Wear a helmet and other protective equipment during sports activities. If you have firearms in your house, make sure you follow all gun safety procedures. Seek help if you have been physically or sexually abused. What's next? Visit your health care provider once a year for an annual wellness visit. Ask your health care provider how often you should have your eyes and teeth checked. Stay up to date on all vaccines. This information is not intended to replace advice given to you by your health care provider. Make sure you discuss any questions you have with your health care provider. Document Revised: 05/08/2021 Document Reviewed: 05/08/2021 Elsevier Patient Education  [place].

## 2021-12-11 ENCOUNTER — Encounter: Payer: Self-pay | Admitting: *Deleted

## 2021-12-11 NOTE — Telephone Encounter (Signed)
Called and left voicemail for patient to call back to be scheduled. 

## 2021-12-13 NOTE — Telephone Encounter (Signed)
Patient is scheduled for 01/20/22 with [facility]

## 2021-12-16 ENCOUNTER — Inpatient Hospital Stay: Payer: PRIVATE HEALTH INSURANCE

## 2021-12-16 ENCOUNTER — Encounter: Payer: Self-pay | Admitting: Oncology

## 2021-12-16 ENCOUNTER — Other Ambulatory Visit: Payer: Self-pay

## 2021-12-16 ENCOUNTER — Inpatient Hospital Stay: Payer: PRIVATE HEALTH INSURANCE | Attending: Oncology | Admitting: Oncology

## 2021-12-16 VITALS — BP 107/75 | HR 87 | Temp 97.7°F | Resp 18 | Wt 165.7 lb

## 2021-12-16 DIAGNOSIS — D500 Iron deficiency anemia secondary to blood loss (chronic): Secondary | ICD-10-CM | POA: Diagnosis not present

## 2021-12-16 DIAGNOSIS — D509 Iron deficiency anemia, unspecified: Secondary | ICD-10-CM | POA: Insufficient documentation

## 2021-12-16 DIAGNOSIS — N920 Excessive and frequent menstruation with regular cycle: Secondary | ICD-10-CM | POA: Diagnosis not present

## 2021-12-16 NOTE — Progress Notes (Signed)
Patient here to establish care for anemia 

## 2021-12-16 NOTE — Progress Notes (Signed)
Hematology/Oncology Consult note [phone]) [phone] [phone]) [phone]      Patient Care Team: Alba CorySowles, Krichna, MD as PCP - General (Family Medicine) Alwyn Peaallwood, Dwayne D, MD as Consulting Physician (Cardiology) Morene CrockerPotter, Zachary E, MD as Referring Physician (Neurology) Rickard PatienceYu, Anavi Branscum, MD as Consulting Physician (Hematology and Oncology)  REFERRING PROVIDER: Alba CorySowles, Krichna, MD  CHIEF COMPLAINTS/REASON FOR VISIT:  Evaluation of iron deficiency anemia.  HISTORY OF PRESENTING ILLNESS:   Katie Hays is a  41 y.o.  female with PMH listed below was seen in consultation at the request of  Alba CorySowles, Krichna, MD  for evaluation of iron deficiency anemia  12/10/2021, CBC showed hemoglobin 9.8, MCV 80.6.  Platelet 332,000, white count of 3.1.  Normal differential. Iron panel showed ferritin of 3, iron saturation 9, TIBC 401. Reviewed patient's previous blood work, patient seems to have chronic iron deficiency anemia.  With ferritin chronically at the level less than 10.  Normal folate and vitamin B12 level. Patient reports that she has tried oral iron supplementation not able to remember taking it due to busy work schedules.  She works as a Engineer, manufacturing systemsreal estate agent and investor.  Patient reports feeling tired and fatigued.  She has heavy menstrual periods.  History of tubal ligation.  She has an appointment with gynecology in the upcoming weeks. Patient has history of meningitis.  Also prior history of pericardial effusion.  Patient reports that she follows up with cardiology.  She was last seen by her cardiology Dr. Laural BenesJohnson in February 2021. Denies black tarry stool or bloody stool.  Denies family history of colon cancer.  Review of Systems  Constitutional:  Positive for fatigue. Negative for appetite change, chills and fever.  HENT:   Negative for hearing loss and voice change.   Eyes:  Negative for eye problems.  Respiratory:  Negative for chest tightness and cough.   Cardiovascular:  Negative  for chest pain.  Gastrointestinal:  Negative for abdominal distention, abdominal pain and blood in stool.  Endocrine: Negative for hot flashes.  Genitourinary:  Positive for menstrual problem. Negative for difficulty urinating and frequency.   Musculoskeletal:  Negative for arthralgias.  Skin:  Negative for itching and rash.  Neurological:  Negative for extremity weakness.  Hematological:  Negative for adenopathy.  Psychiatric/Behavioral:  Negative for confusion.    MEDICAL HISTORY:  Past Medical History:  Diagnosis Date   Anemia    Chronic nonintractable headache    History of meningitis    History of pneumonia    Hyperglycemia    Pericardial effusion    Vitamin D deficiency     SURGICAL HISTORY: Past Surgical History:  Procedure Laterality Date   CESAREAN SECTION  04/30/2016   04/16/2012   TUBAL LIGATION Bilateral 04/30/2016    SOCIAL HISTORY: Social History   Socioeconomic History   Marital status: Married    Spouse name: Moses   Number of children: 2   Years of education: Not on file   Highest education level: Bachelor's degree (e.g., BA, AB, BS)  Occupational History   Occupation: real state appraisal  Tobacco Use   Smoking status: Never   Smokeless tobacco: Never  Vaping Use   Vaping Use: Never used  Substance and Sexual Activity   Alcohol use: Never    Comment: never   Drug use: No   Sexual activity: Yes    Partners: Male    Birth control/protection: Other-see comments    Comment: Tubal Ligation  Other Topics Concern   Not on file  Social  History Narrative   Married and has two children, last one born in 2017    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   Social Determinants of Corporate investment banker Strain: Low Risk    Difficulty of Paying Living Expenses: Not hard at all  Food Insecurity: No Food Insecurity   Worried About Programme researcher, broadcasting/film/video in the Last Year: Never true   Barista in the Last Year: Never true  Transportation  Needs: No Transportation Needs   Lack of Transportation (Medical): No   Lack of Transportation (Non-Medical): No  Physical Activity: Insufficiently Active   Days of Exercise per Week: 3 days   Minutes of Exercise per Session: 40 min  Stress: Stress Concern Present   Feeling of Stress : Very much  Social Connections: Socially Integrated   Frequency of Communication with Friends and Family: More than three times a week   Frequency of Social Gatherings with Friends and Family: Once a week   Attends Religious Services: More than 4 times per year   Active Member of [employer] or Organizations: Yes   Attends Engineer, structural: 1 to 4 times per year   Marital Status: Married  Catering manager Violence: Not At Risk   Fear of Current or Ex-Partner: No   Emotionally Abused: No   Physically Abused: No   Sexually Abused: No    FAMILY HISTORY: Family History  Problem Relation Age of Onset   Clotting disorder Mother    Arthritis Mother    Anemia Mother    Irregular heart beat Mother    High Cholesterol Mother    Heart disease Father    Diabetes Father    Hypertension Father    Asthma Sister    Hypertension Sister    Diabetes Sister    Asthma Sister    Hypertension Sister    Heart attack Sister    Asthma Brother    Heart attack Brother     ALLERGIES:  is allergic to dilaudid [hydromorphone hcl].  MEDICATIONS:  Current Outpatient Medications  Medication Sig Dispense Refill   Cholecalciferol 25 MCG (1000 UT) tablet Take by mouth.     ferrous sulfate 325 (65 FE) MG tablet Take 1 tablet (325 mg total) by mouth 3 (three) times daily with meals. Take colace if needed 270 tablet 1   Multiple Vitamins-Minerals (MULTIVITAMIN GUMMIES WOMENS PO) Take 2 each by mouth daily. VitaFusion Organic     No current facility-administered medications for this visit.     PHYSICAL EXAMINATION: ECOG PERFORMANCE STATUS: 1 - Symptomatic but completely ambulatory Vitals:   12/16/21 1116  BP:  107/75  Pulse: 87  Resp: 18  Temp: 97.7 F (36.5 C)   Filed Weights   12/16/21 1116  Weight: 165 lb 11.2 oz (75.2 kg)    Physical Exam Constitutional:      General: She is not in acute distress. HENT:     Head: Normocephalic and atraumatic.  Eyes:     General: No scleral icterus. Cardiovascular:     Rate and Rhythm: Normal rate and regular rhythm.     Heart sounds: Normal heart sounds.  Pulmonary:     Effort: Pulmonary effort is normal. No respiratory distress.     Breath sounds: No wheezing.  Abdominal:     General: Bowel sounds are normal. There is no distension.     Palpations: Abdomen is soft.  Musculoskeletal:        General: No deformity. Normal  range of motion.     Cervical back: Normal range of motion and neck supple.  Skin:    General: Skin is warm and dry.     Findings: No erythema or rash.  Neurological:     Mental Status: She is alert and oriented to person, place, and time. Mental status is at baseline.     Cranial Nerves: No cranial nerve deficit.     Coordination: Coordination normal.  Psychiatric:        Mood and Affect: Mood normal.    LABORATORY DATA:  I have reviewed the data as listed Lab Results  Component Value Date   WBC 3.1 (L) 12/10/2021   HGB 9.8 (L) 12/10/2021   HCT 31.9 (L) 12/10/2021   MCV 80.6 12/10/2021   PLT 332 12/10/2021   Recent Labs    12/10/21 0955  NA 139  K 4.3  CL 107  CO2 26  GLUCOSE 86  BUN 9  CREATININE 0.82  CALCIUM 9.3  PROT 7.4  AST 19  ALT 15  BILITOT 0.4   Iron/TIBC/Ferritin/ %Sat    Component Value Date/Time   IRON 35 (L) 12/10/2021 0955   TIBC 401 12/10/2021 0955   FERRITIN 3 (L) 12/10/2021 0955   IRONPCTSAT 9 (L) 12/10/2021 0955      RADIOGRAPHIC STUDIES: I have personally reviewed the radiological images as listed and agreed with the findings in the report. No results found.    ASSESSMENT & PLAN:  1. Iron deficiency anemia due to chronic blood loss   2. Menorrhagia with regular  cycle    Iron deficiency anemia due to chronic blood loss. LAbs reviewed and discussed with patient. Patient has previously tried oral iron supplementation with no significant improvement of her blood levels and she is not able to take iron tablets regularly.  I recommend IV Venofer treatments. Plan IV iron with Venofer 200mg  weekly x 4 doses. Risks of infusion reactions including anaphylactic reactions were discussed with patient. Other side effects include but not limited to high blood pressure, headache,wheezing, SOB, skin rash and itchiness, weight gain, leg swelling and cramps, nausea, infusion site reaction, etc. Patient voices understanding and is willing to proceed.  Menorrhagia, recommend patient to follow-up with gynecology for further evaluation and management.  Orders Placed This Encounter  Procedures   CBC with Differential/Platelet    Standing Status:   Future    Standing Expiration Date:   12/16/2022   Ferritin    Standing Status:   Future    Standing Expiration Date:   12/16/2022   Iron and TIBC    Standing Status:   Future    Standing Expiration Date:   12/16/2022    All questions were answered. The patient knows to call the clinic with any problems questions or concerns.  cc 12/18/2022, MD    Return of visit: Follow-up in 3 months with repeat blood work and evaluation of need of additional IV Venofer treatments. Thank you for this kind referral and the opportunity to participate in the care of this patient. A copy of today's note is routed to referring provider   Alba Cory, MD, PhD [facility] Hematology Oncology 12/16/2021

## 2021-12-19 ENCOUNTER — Inpatient Hospital Stay: Payer: PRIVATE HEALTH INSURANCE

## 2021-12-19 ENCOUNTER — Other Ambulatory Visit: Payer: Self-pay

## 2021-12-19 VITALS — BP 99/59 | HR 58 | Temp 98.0°F | Resp 18

## 2021-12-19 DIAGNOSIS — D509 Iron deficiency anemia, unspecified: Secondary | ICD-10-CM

## 2021-12-19 DIAGNOSIS — D500 Iron deficiency anemia secondary to blood loss (chronic): Secondary | ICD-10-CM | POA: Diagnosis not present

## 2021-12-19 MED ORDER — SODIUM CHLORIDE 0.9 % IV SOLN
Freq: Once | INTRAVENOUS | Status: AC
  Filled 2021-12-19: qty 250

## 2021-12-19 MED ORDER — SODIUM CHLORIDE 0.9 % IV SOLN: 200.0000 mg | Freq: Once | INTRAVENOUS | Status: DC

## 2021-12-19 MED ORDER — IRON SUCROSE 20 MG/ML IV SOLN
200.0000 mg | Freq: Once | INTRAVENOUS | Status: AC
  Administered 2021-12-19: 200 mg via INTRAVENOUS
  Filled 2021-12-19: qty 10

## 2021-12-26 ENCOUNTER — Inpatient Hospital Stay: Payer: PRIVATE HEALTH INSURANCE | Attending: Oncology

## 2021-12-26 ENCOUNTER — Other Ambulatory Visit: Payer: Self-pay

## 2021-12-26 VITALS — BP 107/55 | HR 60 | Temp 96.6°F | Resp 18

## 2021-12-26 DIAGNOSIS — Z79899 Other long term (current) drug therapy: Secondary | ICD-10-CM | POA: Diagnosis not present

## 2021-12-26 DIAGNOSIS — D509 Iron deficiency anemia, unspecified: Secondary | ICD-10-CM | POA: Diagnosis not present

## 2021-12-26 MED ORDER — SODIUM CHLORIDE 0.9 % IV SOLN: 200.0000 mg | Freq: Once | INTRAVENOUS | Status: DC

## 2021-12-26 MED ORDER — IRON SUCROSE 20 MG/ML IV SOLN
200.0000 mg | Freq: Once | INTRAVENOUS | Status: AC
  Administered 2021-12-26: 200 mg via INTRAVENOUS
  Filled 2021-12-26: qty 10

## 2021-12-26 MED ORDER — SODIUM CHLORIDE 0.9 % IV SOLN
Freq: Once | INTRAVENOUS | Status: AC
  Filled 2021-12-26: qty 250

## 2021-12-26 NOTE — Patient Instructions (Signed)
[facility]   ?Discharge Instructions: ?Thank you for choosing [facility] to provide your oncology and hematology care.  ?If you have a lab appointment with the Cancer Center, please go directly to the Cancer Center and check in at the registration area. ?  ?We strive to give you quality time with your provider. You may need to reschedule your appointment if you arrive late (15 or more minutes).  Arriving late affects you and other patients whose appointments are after yours.  Also, if you miss three or more appointments without notifying the office, you may be dismissed from the clinic at the provider?s discretion.    ?  ?For prescription refill requests, have your pharmacy contact our office and allow 72 hours for refills to be completed.   ? ?Today you received the following: Venofer.    ?  ?BELOW ARE SYMPTOMS THAT SHOULD BE REPORTED IMMEDIATELY: ?*FEVER GREATER THAN 100.4 F (38 ?C) OR HIGHER ?*CHILLS OR SWEATING ?*NAUSEA AND VOMITING THAT IS NOT CONTROLLED WITH YOUR NAUSEA MEDICATION ?*UNUSUAL SHORTNESS OF BREATH ?*UNUSUAL BRUISING OR BLEEDING ?*URINARY PROBLEMS (pain or burning when urinating, or frequent urination) ?*BOWEL PROBLEMS (unusual diarrhea, constipation, pain near the anus) ?TENDERNESS IN MOUTH AND THROAT WITH OR WITHOUT PRESENCE OF ULCERS (sore throat, sores in mouth, or a toothache) ?UNUSUAL RASH, SWELLING OR PAIN  ?UNUSUAL VAGINAL DISCHARGE OR ITCHING  ? ?Items with * indicate a potential emergency and should be followed up as soon as possible or go to the Emergency Department if any problems should occur. ? ?Should you have questions after your visit or need to cancel or reschedule your appointment, please contact [facility]  Dept: [phone]  and follow the prompts.  Office hours are 8:00 a.m. to 4:30 p.m. Monday - Friday. Please note that voicemails left after 4:00 p.m. may not be returned until the following  business day.  We are closed weekends and major holidays. You have access to a nurse at all times for urgent questions. Please call the main number to the clinic Dept: [phone] and follow the prompts. ? ?For any non-urgent questions, you may also contact your provider using MyChart. We now offer e-Visits for anyone 18 and older to request care online for non-urgent symptoms. For details visit [email or web address]. ?  ?Also download the MyChart app! Go to the app store, search "MyChart", open the app, select [facility], and log in with your MyChart username and password. ? ?Due to Covid, a mask is required upon entering the hospital/clinic. If you do not have a mask, one will be given to you upon arrival. For doctor visits, patients may have 1 support person aged 18 or older with them. For treatment visits, patients cannot have anyone with them due to current Covid guidelines and our immunocompromised population.  ?

## 2022-01-02 ENCOUNTER — Other Ambulatory Visit: Payer: Self-pay

## 2022-01-02 ENCOUNTER — Inpatient Hospital Stay: Payer: PRIVATE HEALTH INSURANCE

## 2022-01-02 VITALS — BP 101/53 | HR 55 | Temp 99.2°F | Resp 20

## 2022-01-02 DIAGNOSIS — D509 Iron deficiency anemia, unspecified: Secondary | ICD-10-CM

## 2022-01-02 MED ORDER — SODIUM CHLORIDE 0.9 % IV SOLN: 200.0000 mg | Freq: Once | INTRAVENOUS | Status: DC

## 2022-01-02 MED ORDER — IRON SUCROSE 20 MG/ML IV SOLN
200.0000 mg | Freq: Once | INTRAVENOUS | Status: AC
  Administered 2022-01-02: 200 mg via INTRAVENOUS
  Filled 2022-01-02: qty 10

## 2022-01-02 MED ORDER — SODIUM CHLORIDE 0.9 % IV SOLN
Freq: Once | INTRAVENOUS | Status: AC
  Filled 2022-01-02: qty 250

## 2022-01-06 ENCOUNTER — Encounter: Payer: Self-pay | Admitting: Oncology

## 2022-01-09 ENCOUNTER — Inpatient Hospital Stay: Payer: PRIVATE HEALTH INSURANCE

## 2022-01-09 ENCOUNTER — Telehealth: Payer: Self-pay | Admitting: *Deleted

## 2022-01-09 NOTE — Telephone Encounter (Signed)
Patient needs to reschedule today's appointment. °

## 2022-01-09 NOTE — Telephone Encounter (Signed)
Pt has been rescheduled and made aware of appts

## 2022-01-10 ENCOUNTER — Inpatient Hospital Stay: Payer: PRIVATE HEALTH INSURANCE

## 2022-01-20 ENCOUNTER — Encounter: Payer: Self-pay | Admitting: Obstetrics and Gynecology

## 2022-01-20 ENCOUNTER — Encounter: Payer: Self-pay | Admitting: Oncology

## 2022-01-23 ENCOUNTER — Encounter: Payer: Self-pay | Admitting: Oncology

## 2022-01-23 ENCOUNTER — Ambulatory Visit: Payer: BLUE CROSS/BLUE SHIELD | Admitting: Allergy & Immunology

## 2022-02-24 ENCOUNTER — Encounter: Payer: Self-pay | Admitting: Obstetrics and Gynecology

## 2022-03-04 ENCOUNTER — Encounter: Payer: Self-pay | Admitting: Oncology

## 2022-03-10 ENCOUNTER — Inpatient Hospital Stay: Payer: PRIVATE HEALTH INSURANCE | Attending: Oncology

## 2022-03-10 VITALS — BP 103/40 | HR 61 | Temp 98.6°F | Resp 18

## 2022-03-10 DIAGNOSIS — Z79899 Other long term (current) drug therapy: Secondary | ICD-10-CM | POA: Diagnosis not present

## 2022-03-10 DIAGNOSIS — D509 Iron deficiency anemia, unspecified: Secondary | ICD-10-CM | POA: Insufficient documentation

## 2022-03-10 DIAGNOSIS — I3139 Other pericardial effusion (noninflammatory): Secondary | ICD-10-CM | POA: Insufficient documentation

## 2022-03-10 DIAGNOSIS — N920 Excessive and frequent menstruation with regular cycle: Secondary | ICD-10-CM | POA: Diagnosis not present

## 2022-03-10 DIAGNOSIS — R5383 Other fatigue: Secondary | ICD-10-CM | POA: Insufficient documentation

## 2022-03-10 DIAGNOSIS — D500 Iron deficiency anemia secondary to blood loss (chronic): Secondary | ICD-10-CM | POA: Diagnosis present

## 2022-03-10 MED ORDER — IRON SUCROSE 20 MG/ML IV SOLN
200.0000 mg | Freq: Once | INTRAVENOUS | Status: AC
  Administered 2022-03-10: 200 mg via INTRAVENOUS
  Filled 2022-03-10: qty 10

## 2022-03-10 MED ORDER — SODIUM CHLORIDE 0.9 % IV SOLN
Freq: Once | INTRAVENOUS | Status: AC
  Filled 2022-03-10: qty 250

## 2022-03-10 MED ORDER — SODIUM CHLORIDE 0.9 % IV SOLN: 200.0000 mg | Freq: Once | INTRAVENOUS | Status: DC

## 2022-03-10 NOTE — Patient Instructions (Signed)

## 2022-03-18 ENCOUNTER — Inpatient Hospital Stay: Payer: PRIVATE HEALTH INSURANCE

## 2022-03-20 ENCOUNTER — Inpatient Hospital Stay (HOSPITAL_BASED_OUTPATIENT_CLINIC_OR_DEPARTMENT_OTHER): Payer: PRIVATE HEALTH INSURANCE | Admitting: Oncology

## 2022-03-20 ENCOUNTER — Encounter: Payer: Self-pay | Admitting: Oncology

## 2022-03-20 ENCOUNTER — Inpatient Hospital Stay: Payer: PRIVATE HEALTH INSURANCE

## 2022-03-20 ENCOUNTER — Other Ambulatory Visit: Payer: Self-pay

## 2022-03-20 VITALS — BP 94/61 | HR 76 | Temp 98.4°F | Resp 16 | Wt 166.4 lb

## 2022-03-20 DIAGNOSIS — D500 Iron deficiency anemia secondary to blood loss (chronic): Secondary | ICD-10-CM

## 2022-03-20 DIAGNOSIS — D509 Iron deficiency anemia, unspecified: Secondary | ICD-10-CM | POA: Diagnosis not present

## 2022-03-20 DIAGNOSIS — N920 Excessive and frequent menstruation with regular cycle: Secondary | ICD-10-CM

## 2022-03-20 LAB — CBC WITH DIFFERENTIAL/PLATELET
Abs Immature Granulocytes: 0.01 10*3/uL (ref 0.00–0.07)
Basophils Absolute: 0 10*3/uL (ref 0.0–0.1)
Basophils Relative: 1 %
Eosinophils Absolute: 0.2 10*3/uL (ref 0.0–0.5)
Eosinophils Relative: 4 %
HCT: 38.2 % (ref 36.0–46.0)
Hemoglobin: 12.2 g/dL (ref 12.0–15.0)
Immature Granulocytes: 0 %
Lymphocytes Relative: 36 %
Lymphs Abs: 1.5 10*3/uL (ref 0.7–4.0)
MCH: 27.2 pg (ref 26.0–34.0)
MCHC: 31.9 g/dL (ref 30.0–36.0)
MCV: 85.3 fL (ref 80.0–100.0)
Monocytes Absolute: 0.3 10*3/uL (ref 0.1–1.0)
Monocytes Relative: 7 %
Neutro Abs: 2.2 10*3/uL (ref 1.7–7.7)
Neutrophils Relative %: 52 %
Platelets: 321 10*3/uL (ref 150–400)
RBC: 4.48 MIL/uL (ref 3.87–5.11)
RDW: 17.1 % — ABNORMAL HIGH (ref 11.5–15.5)
WBC: 4.2 10*3/uL (ref 4.0–10.5)
nRBC: 0 % (ref 0.0–0.2)

## 2022-03-20 LAB — IRON AND TIBC
Iron: 65 ug/dL (ref 28–170)
Saturation Ratios: 18 % (ref 10.4–31.8)
TIBC: 361 ug/dL (ref 250–450)
UIBC: 296 ug/dL

## 2022-03-20 LAB — FERRITIN: Ferritin: 43 ng/mL (ref 11–307)

## 2022-03-20 NOTE — Progress Notes (Signed)
?Hematology/Oncology Progress note ?Telephone:([phone] [phone] ?  ? ?   ? ? ?Patient Care Team: ?Alba Cory, MD as PCP - General (Family Medicine) ?Alwyn Pea, MD as Consulting Physician (Cardiology) ?Morene Crocker, MD as Referring Physician (Neurology) ?Rickard Patience, MD as Consulting Physician (Hematology and Oncology) ? ?REFERRING PROVIDER: ?Alba Cory, MD  ?CHIEF COMPLAINTS/REASON FOR VISIT:  ?iron deficiency anemia. ? ?HISTORY OF PRESENTING ILLNESS:  ? ?Katie Hays is a  41 y.o.  female with PMH listed below was seen in consultation at the request of  Alba Cory, MD  for evaluation of iron deficiency anemia ? ?12/10/2021, CBC showed hemoglobin 9.8, MCV 80.6.  Platelet 332,000, white count of 3.1.  Normal differential. ?Iron panel showed ferritin of 3, iron saturation 9, TIBC 401. ?Reviewed patient's previous blood work, patient seems to have chronic iron deficiency anemia.  With ferritin chronically at the level less than 10.  Normal folate and vitamin B12 level. ?Patient reports that she has tried oral iron supplementation not able to remember taking it due to busy work schedules.  She works as a Engineer, manufacturing systems. ? ?Patient reports feeling tired and fatigued.  She has heavy menstrual periods.  History of tubal ligation.  ?She has an appointment with gynecology in the upcoming weeks. ?Patient has history of meningitis.  Also prior history of pericardial effusion.  Patient reports that she follows up with cardiology.  She was last seen by her cardiology Dr. Laural Benes in February 2021. ?Denies black tarry stool or bloody stool.  Denies family history of colon cancer. ? ?INTERVAL HISTORY ?Katie Hays is a 41 y.o. female who has above history reviewed by me today presents for follow up visit for iron deficiency anemia. ?Patient felt slightly improvement of her fatigue level after the first 2 IV Venofer treatments.  She feels more tired recently. ?She is  in the process of discussing with her gynecology about management of menorrhagia.  ? ? ?Review of Systems  ?Constitutional:  Positive for fatigue. Negative for appetite change, chills and fever.  ?HENT:   Negative for hearing loss and voice change.   ?Eyes:  Negative for eye problems.  ?Respiratory:  Negative for chest tightness and cough.   ?Cardiovascular:  Negative for chest pain.  ?Gastrointestinal:  Negative for abdominal distention, abdominal pain and blood in stool.  ?Endocrine: Negative for hot flashes.  ?Genitourinary:  Positive for menstrual problem. Negative for difficulty urinating and frequency.   ?Musculoskeletal:  Negative for arthralgias.  ?Skin:  Negative for itching and rash.  ?Neurological:  Negative for extremity weakness.  ?Hematological:  Negative for adenopathy.  ?Psychiatric/Behavioral:  Negative for confusion.   ? ?MEDICAL HISTORY:  ?Past Medical History:  ?Diagnosis Date  ? Anemia   ? Chronic nonintractable headache   ? History of meningitis   ? History of pneumonia   ? Hyperglycemia   ? Pericardial effusion   ? Vitamin D deficiency   ? ? ?SURGICAL HISTORY: ?Past Surgical History:  ?Procedure Laterality Date  ? CESAREAN SECTION  04/30/2016  ? 04/16/2012  ? TUBAL LIGATION Bilateral 04/30/2016  ? ? ?SOCIAL HISTORY: ?Social History  ? ?Socioeconomic History  ? Marital status: Married  ?  Spouse name: Patrcia Dolly  ? Number of children: 2  ? Years of education: Not on file  ? Highest education level: Bachelor's degree (e.g., BA, AB, BS)  ?Occupational History  ? Occupation: real state appraisal  ?Tobacco Use  ? Smoking status: Never  ?  Smokeless tobacco: Never  ?Vaping Use  ? Vaping Use: Never used  ?Substance and Sexual Activity  ? Alcohol use: Never  ?  Comment: never  ? Drug use: No  ? Sexual activity: Yes  ?  Partners: Male  ?  Birth control/protection: Other-see comments  ?  Comment: Tubal Ligation  ?Other Topics Concern  ? Not on file  ?Social History Narrative  ? Married and has two children,  last one born in 2017   ? 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 ?Social Determinants of Health  ? ?Financial Resource Strain: Low Risk   ? Difficulty of Paying Living Expenses: Not hard at all  ?Food Insecurity: No Food Insecurity  ? Worried About Programme researcher, broadcasting/film/videounning Out of Food in the Last Year: Never true  ? Ran Out of Food in the Last Year: Never true  ?Transportation Needs: No Transportation Needs  ? Lack of Transportation (Medical): No  ? Lack of Transportation (Non-Medical): No  ?Physical Activity: Insufficiently Active  ? Days of Exercise per Week: 3 days  ? Minutes of Exercise per Session: 40 min  ?Stress: Stress Concern Present  ? Feeling of Stress : Very much  ?Social Connections: Socially Integrated  ? Frequency of Communication with Friends and Family: More than three times a week  ? Frequency of Social Gatherings with Friends and Family: Once a week  ? Attends Religious Services: More than 4 times per year  ? Active Member of Clubs or Organizations: Yes  ? Attends BankerClub or Organization Meetings: 1 to 4 times per year  ? Marital Status: Married  ?Intimate Partner Violence: Not At Risk  ? Fear of Current or Ex-Partner: No  ? Emotionally Abused: No  ? Physically Abused: No  ? Sexually Abused: No  ? ? ?FAMILY HISTORY: ?Family History  ?Problem Relation Age of Onset  ? Clotting disorder Mother   ? Arthritis Mother   ? Anemia Mother   ? Irregular heart beat Mother   ? High Cholesterol Mother   ? Heart disease Father   ? Diabetes Father   ? Hypertension Father   ? Asthma Sister   ? Hypertension Sister   ? Diabetes Sister   ? Asthma Sister   ? Hypertension Sister   ? Heart attack Sister   ? Asthma Brother   ? Heart attack Brother   ? ? ?ALLERGIES:  is allergic to dilaudid [hydromorphone hcl]. ? ?MEDICATIONS:  ?Current Outpatient Medications  ?Medication Sig Dispense Refill  ? Cholecalciferol 25 MCG (1000 UT) tablet Take by mouth.    ? ferrous sulfate 325 (65 FE) MG tablet Take 1 tablet (325 mg total) by mouth 3 (three)  times daily with meals. Take colace if needed 270 tablet 1  ? Multiple Vitamins-Minerals (MULTIVITAMIN GUMMIES WOMENS PO) Take 2 each by mouth daily. VitaFusion Organic    ? ?No current facility-administered medications for this visit.  ? ? ? ?PHYSICAL EXAMINATION: ?ECOG PERFORMANCE STATUS: 1 - Symptomatic but completely ambulatory ?Vitals:  ? 03/20/22 1441  ?BP: 94/61  ?Pulse: 76  ?Resp: 16  ?Temp: 98.4 ?F (36.9 ?C)  ?SpO2: 100%  ? ?Filed Weights  ? 03/20/22 1441  ?Weight: 166 lb 6.4 oz (75.5 kg)  ? ? ?Physical Exam ?Constitutional:   ?   General: She is not in acute distress. ?HENT:  ?   Head: Normocephalic and atraumatic.  ?Eyes:  ?   General: No scleral icterus. ?Cardiovascular:  ?   Rate and Rhythm: Normal rate and  regular rhythm.  ?   Heart sounds: Normal heart sounds.  ?Pulmonary:  ?   Effort: Pulmonary effort is normal. No respiratory distress.  ?   Breath sounds: No wheezing.  ?Abdominal:  ?   General: Bowel sounds are normal. There is no distension.  ?   Palpations: Abdomen is soft.  ?Musculoskeletal:     ?   General: No deformity. Normal range of motion.  ?   Cervical back: Normal range of motion and neck supple.  ?Skin: ?   General: Skin is warm and dry.  ?   Findings: No erythema or rash.  ?Neurological:  ?   Mental Status: She is alert and oriented to person, place, and time. Mental status is at baseline.  ?   Cranial Nerves: No cranial nerve deficit.  ?   Coordination: Coordination normal.  ?Psychiatric:     ?   Mood and Affect: Mood normal.  ? ? ?LABORATORY DATA:  ?I have reviewed the data as listed ?Lab Results  ?Component Value Date  ? WBC 4.2 03/20/2022  ? HGB 12.2 03/20/2022  ? HCT 38.2 03/20/2022  ? MCV 85.3 03/20/2022  ? PLT 321 03/20/2022  ? ?Recent Labs  ?  12/10/21 ?0955  ?NA 139  ?K 4.3  ?CL 107  ?CO2 26  ?GLUCOSE 86  ?BUN 9  ?CREATININE 0.82  ?CALCIUM 9.3  ?PROT 7.4  ?AST 19  ?ALT 15  ?BILITOT 0.4  ? ? ?Iron/TIBC/Ferritin/ %Sat ?   ?Component Value Date/Time  ? IRON 65 03/20/2022 1506  ?  TIBC 361 03/20/2022 1506  ? FERRITIN 43 03/20/2022 1506  ? IRONPCTSAT 18 03/20/2022 1506  ? IRONPCTSAT 9 (L) 12/10/2021 0955  ? ?  ? ? ?RADIOGRAPHIC STUDIES: ?I have personally reviewed the radiologica

## 2022-03-21 ENCOUNTER — Telehealth: Payer: Self-pay

## 2022-03-21 NOTE — Telephone Encounter (Signed)
Spoke with patient of lab results and Dr. Bethanne Ginger plan. Patient verbalized understanding.  ? ? ?Please schedule patient for: ?Lab/MD in 4 months. Please notify patient of appt. Thanks  ?

## 2022-03-21 NOTE — Telephone Encounter (Signed)
-----   Message from Rickard Patience, MD sent at 03/20/2022  8:44 PM EDT ----- ?Please let patient know that her hemoglobin has normalized, at 12.2.  Iron level has also normalized.  She does not need additional IV Venofer at this point.  Please arrange patient to follow-up in 4 months.  Lab MD same day.  Labs are ordered. ?

## 2022-03-26 ENCOUNTER — Encounter: Payer: PRIVATE HEALTH INSURANCE | Admitting: Obstetrics and Gynecology

## 2022-04-08 ENCOUNTER — Encounter: Payer: Self-pay | Admitting: Oncology

## 2022-04-23 ENCOUNTER — Encounter: Payer: PRIVATE HEALTH INSURANCE | Admitting: Obstetrics and Gynecology

## 2022-06-15 NOTE — Progress Notes (Deleted)
Katie Cory, MD   No chief complaint on file.   HPI:      [name] is a 41 y.o. [id number] whose LMP was No LMP recorded., presents today for NP ref ofor menorrhagia;  Neg pelvic CT 2017; s/p C/S and TL.  Seeing hematology for IDA, taking Fe supp  Patient Active Problem List   Diagnosis Date Noted   Iron deficiency anemia 12/16/2021   History of pneumonia 12/06/2020   Hyperglycemia 01/31/2020   Pericardial effusion 12/31/2019   Vitamin D deficiency 01/03/2019   Family history of heart disease in female family member before age 79 07/04/2016   MVP (mitral valve prolapse) 10/28/2015   Migraine headache 10/25/2015   H/O cesarean section 12/02/2012   History of blood transfusion 12/02/2012    Past Surgical History:  Procedure Laterality Date   CESAREAN SECTION  04/30/2016   04/16/2012   TUBAL LIGATION Bilateral 04/30/2016    Family History  Problem Relation Age of Onset   Clotting disorder Mother    Arthritis Mother    Anemia Mother    Irregular heart beat Mother    High Cholesterol Mother    Heart disease Father    Diabetes Father    Hypertension Father    Asthma Sister    Hypertension Sister    Diabetes Sister    Asthma Sister    Hypertension Sister    Heart attack Sister    Asthma Brother    Heart attack Brother     Social History   Socioeconomic History   Marital status: Married    Spouse name: Patrcia Dolly   Number of children: 2   Years of education: Not on file   Highest education level: Bachelor's degree (e.g., BA, AB, BS)  Occupational History   Occupation: real state appraisal  Tobacco Use   Smoking status: Never   Smokeless tobacco: Never  Vaping Use   Vaping Use: Never used  Substance and Sexual Activity   Alcohol use: Never    Comment: never   Drug use: No   Sexual activity: Yes    Partners: Male    Birth control/protection: Other-see comments    Comment: Tubal Ligation  Other Topics Concern   Not on file  Social History  Narrative   Married and has two children, last one born in 2017    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   Social Determinants of Health   Financial Resource Strain: Low Risk  (12/10/2021)   Overall Financial Resource Strain (CARDIA)    Difficulty of Paying Living Expenses: Not hard at all  Food Insecurity: No Food Insecurity (12/10/2021)   Hunger Vital Sign    Worried About Running Out of Food in the Last Year: Never true    Ran Out of Food in the Last Year: Never true  Transportation Needs: No Transportation Needs (12/10/2021)   PRAPARE - Administrator, Civil Service (Medical): No    Lack of Transportation (Non-Medical): No  Physical Activity: Insufficiently Active (12/10/2021)   Exercise Vital Sign    Days of Exercise per Week: 3 days    Minutes of Exercise per Session: 40 min  Stress: Stress Concern Present (12/10/2021)   [employer] of Occupational Health - Occupational Stress Questionnaire    Feeling of Stress : Very much  Social Connections: Socially Integrated (12/10/2021)   Social Connection and Isolation Panel [NHANES]    Frequency of Communication with Friends and Family: More than three  times a week    Frequency of Social Gatherings with Friends and Family: Once a week    Attends Religious Services: More than 4 times per year    Active Member of [employer] or Organizations: Yes    Attends Banker Meetings: 1 to 4 times per year    Marital Status: Married  Catering manager Violence: Not At Risk (12/10/2021)   Humiliation, Afraid, Rape, and Kick questionnaire    Fear of Current or Ex-Partner: No    Emotionally Abused: No    Physically Abused: No    Sexually Abused: No    Outpatient Medications Prior to Visit  Medication Sig Dispense Refill   Cholecalciferol 25 MCG (1000 UT) tablet Take by mouth.     ferrous sulfate 325 (65 FE) MG tablet Take 1 tablet (325 mg total) by mouth 3 (three) times daily with meals. Take colace if needed 270 tablet 1    Multiple Vitamins-Minerals (MULTIVITAMIN GUMMIES WOMENS PO) Take 2 each by mouth daily. [employer]     No facility-administered medications prior to visit.      ROS:  Review of Systems BREAST: No symptoms   OBJECTIVE:   Vitals:  There were no vitals taken for this visit.  Physical Exam  Results: No results found for this or any previous visit (from the past 24 hour(s)).   Assessment/Plan: No diagnosis found.    No orders of the defined types were placed in this encounter.     No follow-ups on file.  Jacinto Keil B. Merci Walthers, PA-C 06/15/2022 9:51 PM

## 2022-06-19 ENCOUNTER — Encounter: Payer: PRIVATE HEALTH INSURANCE | Admitting: Obstetrics and Gynecology

## 2022-07-16 ENCOUNTER — Encounter: Payer: Self-pay | Admitting: Oncology

## 2022-07-22 ENCOUNTER — Inpatient Hospital Stay: Payer: Self-pay | Attending: Oncology

## 2022-07-22 ENCOUNTER — Inpatient Hospital Stay: Payer: Self-pay | Admitting: Oncology

## 2022-08-03 ENCOUNTER — Ambulatory Visit
Admission: EM | Admit: 2022-08-03 | Discharge: 2022-08-03 | Disposition: A | Discharge status: Home / Self Care | Payer: Self-pay | Attending: Emergency Medicine | Admitting: Emergency Medicine

## 2022-08-03 ENCOUNTER — Encounter: Payer: Self-pay | Admitting: Oncology

## 2022-08-03 DIAGNOSIS — Z20822 Contact with and (suspected) exposure to covid-19: Secondary | ICD-10-CM | POA: Insufficient documentation

## 2022-08-03 DIAGNOSIS — B349 Viral infection, unspecified: Secondary | ICD-10-CM | POA: Insufficient documentation

## 2022-08-03 NOTE — ED Provider Notes (Signed)
[facility]    CSN: [id number] Arrival date & time: 08/03/22  1006      History   Chief Complaint Chief Complaint  Patient presents with   Chills   Generalized Body Aches   Fatigue    HPI Katie Hays is a 41 y.o. female.  Patient presents with chills, fatigue, body aches, congestion, runny nose, cough.  Symptom onset 08/01/2022.  No fever, ear pain, sore throat, chest pain, shortness of breath, vomiting, diarrhea, or other symptoms.  No OTC medications taken today; treatment yesterday with OTC cold and flu medication.  She [place].  One of her friends that attended also tested positive for COVID yesterday.  Patient had negative at-home COVID test.  Her medical history includes migraine headaches, mitral valve prolapse, paracardial effusion, history of meningitis, history of pneumonia, anemia.  The history is provided by the patient and medical records.    Past Medical History:  Diagnosis Date   Anemia    Chronic nonintractable headache    History of meningitis    History of pneumonia    Hyperglycemia    Pericardial effusion    Vitamin D deficiency     Patient Active Problem List   Diagnosis Date Noted   Iron deficiency anemia 12/16/2021   History of pneumonia 12/06/2020   Hyperglycemia 01/31/2020   Pericardial effusion 12/31/2019   Vitamin D deficiency 01/03/2019   Family history of heart disease in female family member before age 17 07/04/2016   MVP (mitral valve prolapse) 10/28/2015   Migraine headache 10/25/2015   H/O cesarean section 12/02/2012   History of blood transfusion 12/02/2012    Past Surgical History:  Procedure Laterality Date   CESAREAN SECTION  04/30/2016   04/16/2012   TUBAL LIGATION Bilateral 04/30/2016    OB History     Gravida  4   Para  2   Term      Preterm      AB  2   Living  2      SAB      IAB      Ectopic      Multiple      Live Births           Obstetric  Comments  Miscarriage had D&C One was elective abortion          Home Medications    Prior to Admission medications   Medication Sig Start Date End Date Taking? Authorizing Provider  Cholecalciferol 25 MCG (1000 UT) tablet Take by mouth.    [provider]  ferrous sulfate 325 (65 FE) MG tablet Take 1 tablet (325 mg total) by mouth 3 (three) times daily with meals. Take colace if needed 12/12/19   Alba Cory, MD  Multiple Vitamins-Minerals (MULTIVITAMIN GUMMIES WOMENS PO) Take 2 each by mouth daily. [employer]    [provider]    Family History Family History  Problem Relation Age of Onset   Clotting disorder Mother    Arthritis Mother    Anemia Mother    Irregular heart beat Mother    High Cholesterol Mother    Heart disease Father    Diabetes Father    Hypertension Father    Asthma Sister    Hypertension Sister    Diabetes Sister    Asthma Sister    Hypertension Sister    Heart attack Sister    Asthma Brother    Heart attack Brother  Social History Social History   Tobacco Use   Smoking status: Never   Smokeless tobacco: Never  Vaping Use   Vaping Use: Never used  Substance Use Topics   Alcohol use: Never    Comment: never   Drug use: No     Allergies   Dilaudid [hydromorphone hcl]   Review of Systems Review of Systems  Constitutional:  Positive for chills and fatigue. Negative for fever.  HENT:  Positive for congestion and rhinorrhea. Negative for ear pain and sore throat.   Respiratory:  Positive for cough. Negative for shortness of breath.   Cardiovascular:  Negative for chest pain and palpitations.  Gastrointestinal:  Negative for diarrhea and vomiting.  Skin:  Negative for color change and rash.  All other systems reviewed and are negative.    Physical Exam Triage Vital Signs ED Triage Vitals  Enc Vitals Group     BP      Pulse      Resp      Temp      Temp src      SpO2      Weight      Height       Head Circumference      Peak Flow      Pain Score      Pain Loc      Pain Edu?      Excl. in GC?    No data found.  Updated Vital Signs BP 100/65   Pulse 75   Temp 97.9 F (36.6 C)   Resp 18   LMP 07/10/2022   SpO2 98%   Visual Acuity Right Eye Distance:   Left Eye Distance:   Bilateral Distance:    Right Eye Near:   Left Eye Near:    Bilateral Near:     Physical Exam Vitals and nursing note reviewed.  Constitutional:      General: She is not in acute distress.    Appearance: Normal appearance. She is well-developed. She is not ill-appearing.  HENT:     Right Ear: Tympanic membrane normal.     Left Ear: Tympanic membrane normal.     Nose: Congestion and rhinorrhea present.     Mouth/Throat:     Mouth: Mucous membranes are moist.     Pharynx: Oropharynx is clear.  Cardiovascular:     Rate and Rhythm: Normal rate and regular rhythm.     Heart sounds: Normal heart sounds.  Pulmonary:     Effort: Pulmonary effort is normal. No respiratory distress.     Breath sounds: Normal breath sounds.  Musculoskeletal:     Cervical back: Neck supple.  Skin:    General: Skin is warm and dry.  Neurological:     Mental Status: She is alert.  Psychiatric:        Mood and Affect: Mood normal.        Behavior: Behavior normal.      UC Treatments / Results  Labs (all labs ordered are listed, but only abnormal results are displayed) Labs Reviewed  SARS CORONAVIRUS 2 (TAT 6-24 HRS)    EKG   Radiology No results found.  Procedures Procedures (including critical care time)  Medications Ordered in UC Medications - No data to display  Initial Impression / Assessment and Plan / UC Course  I have reviewed the triage vital signs and the nursing notes.  Pertinent labs & imaging results that were available during my care of the patient were reviewed  by me and considered in my medical decision making (see chart for details).    Viral illness.  PCR COVID pending.   If patient is COVID positive, she would be a good candidate for treatment with molnupiravir due to her cardiac history; She is not pregnant and has no plans for having more children; No kidney function tests in her chart since January 2023; patient will do more research on molnupiravir but is leaning towards symptomatic treatment only if she is COVID positive.  Discussed symptomatic treatment including Tylenol or ibuprofen, rest, hydration.  Instructed patient to follow up with her PCP if symptoms are not improving.  She agrees to plan of care.   Final Clinical Impressions(s) / UC Diagnoses   Final diagnoses:  Viral illness     Discharge Instructions      Your COVID test is pending.    Take Tylenol or ibuprofen as needed for fever or discomfort.  Rest and keep yourself hydrated.    Follow-up with your primary care provider if your symptoms are not improving.         ED Prescriptions   None    PDMP not reviewed this encounter.   Mickie Bail, NP 08/03/22 1056

## 2022-08-03 NOTE — Discharge Instructions (Addendum)
Your COVID test is pending.    Take Tylenol or ibuprofen as needed for fever or discomfort.  Rest and keep yourself hydrated.    Follow-up with your primary care provider if your symptoms are not improving.     

## 2022-08-03 NOTE — ED Triage Notes (Signed)
Patient presents to UC for chills, body aches,and fatigue x  Friday. Taking mucinex, tylenol cold/flu. Had 3 negative rapid test.

## 2022-08-04 ENCOUNTER — Encounter: Payer: Self-pay | Admitting: Oncology

## 2022-08-04 LAB — SARS CORONAVIRUS 2 (TAT 6-24 HRS): SARS Coronavirus 2: NEGATIVE

## 2022-08-25 ENCOUNTER — Encounter: Payer: Self-pay | Admitting: Oncology

## 2022-09-19 ENCOUNTER — Encounter: Payer: Self-pay | Admitting: Oncology

## 2022-10-06 ENCOUNTER — Emergency Department (HOSPITAL_COMMUNITY): Admission: EM | Admit: 2022-10-06 | Discharge: 2022-10-06 | Discharge status: Against Medical Advice | Payer: Self-pay

## 2022-10-06 NOTE — ED Notes (Signed)
Patient states they are going to [facility]

## 2022-12-07 ENCOUNTER — Encounter: Payer: Self-pay | Admitting: Oncology

## 2022-12-07 ENCOUNTER — Ambulatory Visit
Admission: EM | Admit: 2022-12-07 | Discharge: 2022-12-07 | Disposition: A | Discharge status: Home / Self Care | Payer: 59 | Attending: Emergency Medicine | Admitting: Emergency Medicine

## 2022-12-07 DIAGNOSIS — H1032 Unspecified acute conjunctivitis, left eye: Secondary | ICD-10-CM | POA: Diagnosis not present

## 2022-12-07 DIAGNOSIS — J069 Acute upper respiratory infection, unspecified: Secondary | ICD-10-CM | POA: Diagnosis not present

## 2022-12-07 LAB — POCT RAPID STREP A (OFFICE): Rapid Strep A Screen: NEGATIVE

## 2022-12-07 MED ORDER — POLYMYXIN B-TRIMETHOPRIM 10000-0.1 UNIT/ML-% OP SOLN: 1.0000 [drp] | Freq: Four times a day (QID) | OPHTHALMIC | 0 refills | Status: AC

## 2022-12-07 NOTE — ED Provider Notes (Signed)
Katie Hays    CSN: [id number] Arrival date & time: 12/07/22  3382      History   Chief Complaint Chief Complaint  Patient presents with   Eye Drainage   Sore Throat    HPI Katie Hays is a 42 y.o. female.  Patient presents with 1 day history of left arm redness, itching, white-yellow drainage, crusting in lashes.  No eye injury, eye pain, change in vision.  She reports cold symptoms x 1 week including runny nose, postnasal drip, congestion, cough.  She developed a sore throat yesterday.  No fever, shortness of breath, vomiting, diarrhea, rash, or other symptoms.  No OTC medications taken today.  Her medical history includes pericardial effusion, mitral valve prolapse, chronic headaches, anemia.  The history is provided by the patient and medical records.    Past Medical History:  Diagnosis Date   Anemia    Chronic nonintractable headache    History of meningitis    History of pneumonia    Hyperglycemia    Pericardial effusion    Vitamin D deficiency     Patient Active Problem List   Diagnosis Date Noted   Iron deficiency anemia 12/16/2021   History of pneumonia 12/06/2020   Hyperglycemia 01/31/2020   Pericardial effusion 12/31/2019   Vitamin D deficiency 01/03/2019   Family history of heart disease in female family member before age 80 07/04/2016   MVP (mitral valve prolapse) 10/28/2015   Migraine headache 10/25/2015   H/O cesarean section 12/02/2012   History of blood transfusion 12/02/2012    Past Surgical History:  Procedure Laterality Date   CESAREAN SECTION  04/30/2016   04/16/2012   TUBAL LIGATION Bilateral 04/30/2016    OB History     Gravida  4   Para  2   Term      Preterm      AB  2   Living  2      SAB      IAB      Ectopic      Multiple      Live Births           Obstetric Comments  Miscarriage had D&C One was elective abortion          Home Medications    Prior to Admission medications    Medication Sig Start Date End Date Taking? Authorizing Provider  trimethoprim-polymyxin b (POLYTRIM) ophthalmic solution Place 1 drop into both eyes 4 (four) times daily for 7 days. 12/07/22 12/14/22 Yes Sharion Balloon, NP  Cholecalciferol 25 MCG (1000 UT) tablet Take by mouth.    [provider]  ferrous sulfate 325 (65 FE) MG tablet Take 1 tablet (325 mg total) by mouth 3 (three) times daily with meals. Take colace if needed 12/12/19   Steele Sizer, MD  Multiple Vitamins-Minerals (MULTIVITAMIN GUMMIES WOMENS PO) Take 2 each by mouth daily. [employer]    [provider]    Family History Family History  Problem Relation Age of Onset   Clotting disorder Mother    Arthritis Mother    Anemia Mother    Irregular heart beat Mother    High Cholesterol Mother    Heart disease Father    Diabetes Father    Hypertension Father    Asthma Sister    Hypertension Sister    Diabetes Sister    Asthma Sister    Hypertension Sister    Heart attack Sister    Asthma Brother  Heart attack Brother     Social History Social History   Tobacco Use   Smoking status: Never   Smokeless tobacco: Never  Vaping Use   Vaping Use: Never used  Substance Use Topics   Alcohol use: Never    Comment: never   Drug use: No     Allergies   Dilaudid [hydromorphone hcl]   Review of Systems Review of Systems  Constitutional:  Negative for chills and fever.  HENT:  Positive for congestion, postnasal drip, rhinorrhea and sore throat. Negative for ear pain.   Eyes:  Positive for discharge, redness and itching. Negative for pain and visual disturbance.  Respiratory:  Negative for cough and shortness of breath.   Cardiovascular:  Negative for chest pain and palpitations.  Gastrointestinal:  Negative for abdominal pain, diarrhea and vomiting.  Skin:  Negative for color change and rash.  All other systems reviewed and are negative.    Physical Exam Triage Vital Signs ED  Triage Vitals  Enc Vitals Group     BP 12/07/22 0838 104/69     Pulse Rate 12/07/22 0830 74     Resp 12/07/22 0830 18     Temp 12/07/22 0830 98.3 F (36.8 C)     Temp src --      SpO2 12/07/22 0830 98 %     Weight 12/07/22 0837 160 lb (72.6 kg)     Height 12/07/22 0837 5\' 5"  (1.651 m)     Head Circumference --      Peak Flow --      Pain Score 12/07/22 0830 8     Pain Loc --      Pain Edu? --      Excl. in GC? --    No data found.  Updated Vital Signs BP 104/69   Pulse 74   Temp 98.3 F (36.8 C)   Resp 18   Ht 5\' 5"  (1.651 m)   Wt 160 lb (72.6 kg)   LMP 11/30/2022   SpO2 98%   BMI 26.63 kg/m   Visual Acuity Right Eye Distance:   Left Eye Distance:   Bilateral Distance:    Right Eye Near:   Left Eye Near:    Bilateral Near:     Physical Exam Vitals and nursing note reviewed.  Constitutional:      General: She is not in acute distress.    Appearance: Normal appearance. She is well-developed. She is not ill-appearing.  HENT:     Right Ear: Tympanic membrane normal.     Left Ear: Tympanic membrane normal.     Nose: Rhinorrhea present.     Mouth/Throat:     Mouth: Mucous membranes are moist.     Pharynx: Oropharynx is clear.     Comments: Clear PND. Eyes:     General: Lids are normal. Vision grossly intact.        Right eye: No discharge.     Extraocular Movements: Extraocular movements intact.     Conjunctiva/sclera:     Right eye: Right conjunctiva is not injected.     Left eye: Left conjunctiva is injected.     Pupils: Pupils are equal, round, and reactive to light.  Cardiovascular:     Rate and Rhythm: Normal rate and regular rhythm.     Heart sounds: Normal heart sounds.  Pulmonary:     Effort: Pulmonary effort is normal. No respiratory distress.     Breath sounds: Normal breath sounds.  Musculoskeletal:  Cervical back: Neck supple.  Skin:    General: Skin is warm and dry.  Neurological:     Mental Status: She is alert.  Psychiatric:         Mood and Affect: Mood normal.        Behavior: Behavior normal.      UC Treatments / Results  Labs (all labs ordered are listed, but only abnormal results are displayed) Labs Reviewed  POCT RAPID STREP A (OFFICE)    EKG   Radiology No results found.  Procedures Procedures (including critical care time)  Medications Ordered in UC Medications - No data to display  Initial Impression / Assessment and Plan / UC Course  I have reviewed the triage vital signs and the nursing notes.  Pertinent labs & imaging results that were available during my care of the patient were reviewed by me and considered in my medical decision making (see chart for details).    Acute bacterial conjunctivitis of left eye, Viral URI.  Treating with Polytrim eyedrops.  Education provided on conjunctivitis.  Rapid strep negative.  Discussed symptomatic treatment including Tylenol, rest, hydration.  Instructed patient to follow up with PCP if symptoms are not improving.  She agrees to plan of care.    Final Clinical Impressions(s) / UC Diagnoses   Final diagnoses:  Acute bacterial conjunctivitis of left eye  Viral URI     Discharge Instructions      Use the antibiotic eyedrops as prescribed.    Follow-up with your primary care provider if your symptoms are not improving.        ED Prescriptions     Medication Sig Dispense Auth. Provider   trimethoprim-polymyxin b (POLYTRIM) ophthalmic solution Place 1 drop into both eyes 4 (four) times daily for 7 days. 10 mL Sharion Balloon, NP      PDMP not reviewed this encounter.   Sharion Balloon, NP 12/07/22 [phone]

## 2022-12-07 NOTE — ED Triage Notes (Signed)
Patient to Urgent Care with complaints of left eye drainage and sore throat.  Reports last night started developing left sided eye itching/ redness. Woke up this morning with eye matted shut. Reports also developing a sore throat last night.  Denies any known fevers.

## 2022-12-07 NOTE — Discharge Instructions (Addendum)
Use the antibiotic eyedrops as prescribed.  Follow up with your primary care provider if your symptoms are not improving.   ? ? ?

## 2022-12-10 NOTE — Progress Notes (Deleted)
Name: Katie Hays   MRN: [id number]    DOB: 1981/01/09   Date:12/10/2022       Progress Note  Subjective  Chief Complaint  Follow Up  HPI  Iron deficiency anemia: she has a long history of anemia, she tries to take ferrous sulfate on regular basis but tired of taking multiple pills daily, last ferritin down to 2 she has intermittent episodes of pica - states it is dirty,  cycles still heavy , lasts 7 days and heavy for 5 days . Discussed OCP or IUD to stop bleeding but she does not want the hormones, discussed endometrial ablation. She is willing to see hematologist and GYN to discuss options  No family history of colon cancer.    Hyperglycemia: denies polyphagia, polydipsia or polyuria, A1C was 5.7 % a few years ago, but normal since     Vitamin D deficiency: she is taking woman's vitamin but currently not taking otc vitamin D    AR: she is has nasal congestion, rhinorrhea, post-nasal drainage and mild headache. She has seen ENT. She tried nasal spray, she is also on singulair. She also saw allergist but all allergy testing was negative. Symptoms unchanged She would like to go see another allergist for a second opinion in [place]    History of pneumonia: admitted to [facility] 12/2019 , she had hypoxia, not due to COVID-19. Stayed for 2 days, she had pneumonia vaccine last year, flu shot today    Pericarditis: chronic since 2016, had echo done during admission for pneumonia in 2003 or 2004 ( per patient)  , she sees Dr. Clayborn Bigness, no chest pain or palpitation, denies orthopnea.   Patient Active Problem List   Diagnosis Date Noted   Iron deficiency anemia 12/16/2021   History of pneumonia 12/06/2020   Hyperglycemia 01/31/2020   Pericardial effusion 12/31/2019   Vitamin D deficiency 01/03/2019   Family history of heart disease in female family member before age 88 07/04/2016   MVP (mitral valve prolapse) 10/28/2015   Migraine headache 10/25/2015   H/O cesarean section 12/02/2012    History of blood transfusion 12/02/2012    Past Surgical History:  Procedure Laterality Date   CESAREAN SECTION  04/30/2016   04/16/2012   TUBAL LIGATION Bilateral 04/30/2016    Family History  Problem Relation Age of Onset   Clotting disorder Mother    Arthritis Mother    Anemia Mother    Irregular heart beat Mother    High Cholesterol Mother    Heart disease Father    Diabetes Father    Hypertension Father    Asthma Sister    Hypertension Sister    Diabetes Sister    Asthma Sister    Hypertension Sister    Heart attack Sister    Asthma Brother    Heart attack Brother     Social History   Tobacco Use   Smoking status: Never   Smokeless tobacco: Never  Substance Use Topics   Alcohol use: Never    Comment: never     Current Outpatient Medications:    Cholecalciferol 25 MCG (1000 UT) tablet, Take by mouth., Disp: , Rfl:    ferrous sulfate 325 (65 FE) MG tablet, Take 1 tablet (325 mg total) by mouth 3 (three) times daily with meals. Take colace if needed, Disp: 270 tablet, Rfl: 1   Multiple Vitamins-Minerals (MULTIVITAMIN GUMMIES WOMENS PO), Take 2 each by mouth daily. VitaFusion Organic, Disp: , Rfl:    trimethoprim-polymyxin b (POLYTRIM) ophthalmic  solution, Place 1 drop into both eyes 4 (four) times daily for 7 days., Disp: 10 mL, Rfl: 0  Allergies  Allergen Reactions   Dilaudid [Hydromorphone Hcl] Swelling    I personally reviewed active problem list, medication list, allergies, family history, social history, health maintenance with the patient/caregiver today.   ROS  ***  Objective  There were no vitals filed for this visit.  There is no height or weight on file to calculate BMI.  Physical Exam ***  Recent Results (from the past 2160 hour(s))  POCT rapid strep A     Status: None   Collection Time: 12/07/22  8:53 AM  Result Value Ref Range   Rapid Strep A Screen Negative Negative    PHQ2/9:    12/10/2021    9:05 AM 12/06/2020    9:31 AM  04/16/2020    2:28 PM 12/06/2019    9:03 AM 02/08/2019   10:21 AM  Depression screen PHQ 2/9  Decreased Interest 0 0 0 1 0  Down, Depressed, Hopeless 0 0 0 0 0  PHQ - 2 Score 0 0 0 1 0  Altered sleeping 3 3 0 1 2  Tired, decreased energy 3 2 1 1 1  $ Change in appetite 3 0 0 1 0  Feeling bad or failure about yourself  0 0 0 0 0  Trouble concentrating 0 0 0 0 0  Moving slowly or fidgety/restless 0 0 0 0 0  Suicidal thoughts 0 0 0 0 0  PHQ-9 Score 9 5 1 4 3  $ Difficult doing work/chores  Not difficult at all Not difficult at all Not difficult at all Not difficult at all    phq 9 is {gen pos [id number]   Fall Risk:    12/10/2021    9:05 AM 12/06/2020    9:32 AM 04/16/2020    2:28 PM 12/06/2019    9:03 AM 02/08/2019   10:21 AM  Fall Risk   Falls in the past year? 0 0 0 0 0  Number falls in past yr: 0 0 0 0 0  Injury with Fall? 0 0 0 0 0  Risk for fall due to : No Fall Risks      Follow up Falls prevention discussed          Functional Status Survey:      Assessment & Plan  *** There are no diagnoses linked to this encounter.

## 2022-12-11 ENCOUNTER — Ambulatory Visit: Payer: No Typology Code available for payment source | Admitting: Family Medicine

## 2022-12-11 ENCOUNTER — Telehealth: Payer: Self-pay

## 2022-12-11 MED ORDER — POLYMYXIN B-TRIMETHOPRIM 10000-0.1 UNIT/ML-% OP SOLN: 1.0000 [drp] | OPHTHALMIC | 0 refills | Status: DC

## 2022-12-11 NOTE — Telephone Encounter (Signed)
Patient lost eye drops. Refill sent to pharmacy per Barkley Boards NP.

## 2023-01-26 ENCOUNTER — Ambulatory Visit (INDEPENDENT_AMBULATORY_CARE_PROVIDER_SITE_OTHER): Payer: No Typology Code available for payment source | Admitting: Family Medicine

## 2023-01-26 ENCOUNTER — Encounter: Payer: Self-pay | Admitting: Family Medicine

## 2023-01-26 VITALS — BP 92/60 | HR 78 | Temp 97.7°F | Resp 16 | Ht 65.0 in | Wt 162.2 lb

## 2023-01-26 DIAGNOSIS — Z1231 Encounter for screening mammogram for malignant neoplasm of breast: Secondary | ICD-10-CM | POA: Diagnosis not present

## 2023-01-26 DIAGNOSIS — R87810 Cervical high risk human papillomavirus (HPV) DNA test positive: Secondary | ICD-10-CM | POA: Diagnosis not present

## 2023-01-26 DIAGNOSIS — N920 Excessive and frequent menstruation with regular cycle: Secondary | ICD-10-CM | POA: Diagnosis not present

## 2023-01-26 DIAGNOSIS — J310 Chronic rhinitis: Secondary | ICD-10-CM

## 2023-01-26 DIAGNOSIS — Z0000 Encounter for general adult medical examination without abnormal findings: Secondary | ICD-10-CM | POA: Diagnosis not present

## 2023-01-26 DIAGNOSIS — D500 Iron deficiency anemia secondary to blood loss (chronic): Secondary | ICD-10-CM

## 2023-01-26 DIAGNOSIS — L659 Nonscarring hair loss, unspecified: Secondary | ICD-10-CM

## 2023-01-26 NOTE — Patient Instructions (Addendum)
[facility] [address], [place], [place] [address] Scheduling phone #: [phone]  Call to set up your mammogram   Team Member [facility] and Specialty Contact Info Address  Ree Edman, MD Referring Physician (Dermatology) Phone: [phone] Fax: [phone] [facility] [address] [place] [address]   Health Maintenance  Topic Date Due   Mammogram  Never done   COVID-19 Vaccine (4 - 2023-24 season) 02/11/2023*   Flu Shot  02/22/2023*   Pap Smear  12/07/2023   DTaP/Tdap/Td vaccine (2 - Td or Tdap) 02/26/2026   Hepatitis C Screening: USPSTF Recommendation to screen - Ages 34-79 yo.  Completed   HIV Screening  Completed   HPV Vaccine  Aged Out  *Topic was postponed. The date shown is not the original due date.     Preventive Care 40-65 Years Old, Female Preventive care refers to lifestyle choices and visits with your health care provider that can promote health and wellness. Preventive care visits are also called wellness exams. What can I expect for my preventive care visit? Counseling Your health care provider may ask you questions about your: Medical history, including: Past medical problems. Family medical history. Pregnancy history. Current health, including: Menstrual cycle. Method of birth control. Emotional well-being. Home life and relationship well-being. Sexual activity and sexual health. Lifestyle, including: Alcohol, nicotine or tobacco, and drug use. Access to firearms. Diet, exercise, and sleep habits. Work and work Statistician. Sunscreen use. Safety issues such as seatbelt and bike helmet use. Physical exam Your health care provider will check your: Height and weight. These may be used to calculate your BMI (body mass index). BMI is a measurement that tells if you are at a healthy weight. Waist circumference. This measures the distance around your waistline.  This measurement also tells if you are at a healthy weight and may help predict your risk of certain diseases, such as type 2 diabetes and high blood pressure. Heart rate and blood pressure. Body temperature. Skin for abnormal spots. What immunizations do I need?  Vaccines are usually given at various ages, according to a schedule. Your health care provider will recommend vaccines for you based on your age, medical history, and lifestyle or other factors, such as travel or where you work. What tests do I need? Screening Your health care provider may recommend screening tests for certain conditions. This may include: Lipid and cholesterol levels. Diabetes screening. This is done by checking your blood sugar (glucose) after you have not eaten for a while (fasting). Pelvic exam and Pap test. Hepatitis B test. Hepatitis C test. HIV (human immunodeficiency virus) test. STI (sexually transmitted infection) testing, if you are at risk. Lung cancer screening. Colorectal cancer screening. Mammogram. Talk with your health care provider about when you should start having regular mammograms. This may depend on whether you have a family history of breast cancer. BRCA-related cancer screening. This may be done if you have a family history of breast, ovarian, tubal, or peritoneal cancers. Bone density scan. This is done to screen for osteoporosis. Talk with your health care provider about your test results, treatment options, and if necessary, the need for more tests. Follow these instructions at home: Eating and drinking  Eat a diet that includes fresh fruits and vegetables, whole grains, lean protein, and low-fat dairy products. Take vitamin and mineral supplements as recommended by your health care provider. Do not drink alcohol if: Your health care provider tells you  not to drink. You are pregnant, may be pregnant, or are planning to become pregnant. If you drink alcohol: Limit how much you have  to 0-1 drink a day. Know how much alcohol is in your drink. In the [place] one drink equals one 12 oz bottle of beer (355 mL), one 5 oz glass of wine (148 mL), or one 1 oz glass of hard liquor (44 mL). Lifestyle Brush your teeth every morning and night with fluoride toothpaste. Floss one time each day. Exercise for at least 30 minutes 5 or more days each week. Do not use any products that contain nicotine or tobacco. These products include cigarettes, chewing tobacco, and vaping devices, such as e-cigarettes. If you need help quitting, ask your health care provider. Do not use drugs. If you are sexually active, practice safe sex. Use a condom or other form of protection to prevent STIs. If you do not wish to become pregnant, use a form of birth control. If you plan to become pregnant, see your health care provider for a prepregnancy visit. Take aspirin only as told by your health care provider. Make sure that you understand how much to take and what form to take. Work with your health care provider to find out whether it is safe and beneficial for you to take aspirin daily. Find healthy ways to manage stress, such as: Meditation, yoga, or listening to music. Journaling. Talking to a trusted person. Spending time with friends and family. Minimize exposure to UV radiation to reduce your risk of skin cancer. Safety Always wear your seat belt while driving or riding in a vehicle. Do not drive: If you have been drinking alcohol. Do not ride with someone who has been drinking. When you are tired or distracted. While texting. If you have been using any mind-altering substances or drugs. Wear a helmet and other protective equipment during sports activities. If you have firearms in your house, make sure you follow all gun safety procedures. Seek help if you have been physically or sexually abused. What's next? Visit your health care provider once a year for an annual wellness visit. Ask your health  care provider how often you should have your eyes and teeth checked. Stay up to date on all vaccines. This information is not intended to replace advice given to you by your health care provider. Make sure you discuss any questions you have with your health care provider. Document Revised: 05/08/2021 Document Reviewed: 05/08/2021 Elsevier Patient Education  [employer].

## 2023-01-26 NOTE — Progress Notes (Signed)
Patient: Katie Hays, Female    DOB: 05/11/1981, 42 y.o.   MRN: [id number] Steele Sizer, MD Visit Date: 01/26/2023  Today's Provider: Delsa Grana, PA-C   Chief Complaint  Patient presents with   Annual Exam   Subjective:   Annual physical exam:  Katie Hays is a 42 y.o. female who presents today for complete physical exam:  Exercise/Activity:   usually exercises with a trainer - looking for a new trainer Diet/nutrition:   no particular nutrition Sleep:  normal - 4-5 h of sleep which is her regular  SDOH Screenings   Food Insecurity: No Food Insecurity (01/26/2023)  Housing: Low Risk  (01/26/2023)  Transportation Needs: No Transportation Needs (01/26/2023)  Utilities: Not At Risk (01/26/2023)  Alcohol Screen: Low Risk  (01/26/2023)  Depression (PHQ2-9): Low Risk  (01/26/2023)  Financial Resource Strain: Low Risk  (01/26/2023)  Physical Activity: Inactive (01/26/2023)  Social Connections: Socially Integrated (01/26/2023)  Stress: Stress Concern Present (01/26/2023)  Tobacco Use: Low Risk  (01/26/2023)    Anemia - check labs and she'll follow up with Hematology as needed  Hair thinning, hairline, eyebrows, legs  Eye brows started, noticed it about 2 years ago, no rash associated, thick hair receeded, falling out, waxing    USPSTF grade A and B recommendations - reviewed and addressed today  Depression:  Phq 9 completed today by patient, was reviewed by me with patient in the room PHQ score is neg, pt feels     01/26/2023    9:20 AM 12/10/2021    9:05 AM 12/06/2020    9:31 AM 04/16/2020    2:28 PM  PHQ 2/9 Scores  PHQ - 2 Score 0 0 0 0  PHQ- 9 Score 0 '9 5 1      '$ 01/26/2023    9:20 AM 12/10/2021    9:05 AM 12/06/2020    9:31 AM 04/16/2020    2:28 PM 12/06/2019    9:03 AM  Depression screen PHQ 2/9  Decreased Interest 0 0 0 0 1  Down, Depressed, Hopeless 0 0 0 0 0  PHQ - 2 Score 0 0 0 0 1  Altered sleeping 0 3 3 0 1  Tired, decreased energy 0 '3 2 1 1  '$ Change in appetite  0 3 0 0 1  Feeling bad or failure about yourself  0 0 0 0 0  Trouble concentrating 0 0 0 0 0  Moving slowly or fidgety/restless 0 0 0 0 0  Suicidal thoughts 0 0 0 0 0  PHQ-9 Score 0 '9 5 1 4  '$ Difficult doing work/chores Not difficult at all  Not difficult at all Not difficult at all Not difficult at all    Alcohol screening: [place] Office Visit from 01/26/2023 in [facility]  AUDIT-C Score 0       Immunizations and Health Maintenance: Health Maintenance  Topic Date Due   MAMMOGRAM  Never done   COVID-19 Vaccine (4 - 2023-24 season) 02/11/2023 (Originally 07/25/2022)   INFLUENZA VACCINE  02/22/2023 (Originally 06/24/2022)   PAP SMEAR-Modifier  12/07/2023   DTaP/Tdap/Td (2 - Td or Tdap) 02/26/2026   Hepatitis C Screening  Completed   HIV Screening  Completed   HPV VACCINES  Aged Out     Hep C Screening: done previously  STD testing and prevention (HIV/chl/gon/syphilis):  see above, no additional testing desired by pt today - done previously low risk  Intimate partner violence:safe   Sexual History/Pain during  Intercourse: Married- no concerns  Menstrual History/LMP/Abnormal Bleeding: regular cycles heavy for 3 days (pads heavy flow pads changing 45 min to max 1.5 hours) lasts 5 day - monthly  No LMP recorded.  Incontinence Symptoms: none  Breast cancer:  ordered Last Mammogram: *see HM list above BRCA gene screening: none known   Cervical cancer screening: 2022 - HPV positive, PAP neg, prior paps 2021 HPV/PAP neg, 2018 HPV/PAP neg with some remote hx of high risk HPV positive Pt no family hx of cancers - breast, ovarian, uterine, colon:     Osteoporosis:   Discussion on osteoporosis per age, including high calcium and vitamin D supplementation, weight bearing exercises On multivitamin  Skin cancer:  Hx of skin CA -  NO  Discussed atypical lesions   Colorectal cancer:   Colonoscopy is not due for age She notes chronic  constipation Discussed concerning signs and sx of CRC, pt denies abd pain, blood in stool  Lung cancer:   Low Dose CT Chest recommended if Age 20-80 years, 20 pack-year currently smoking OR have quit w/in 15years. Patient does not qualify.    Social History   Tobacco Use   Smoking status: Never   Smokeless tobacco: Never  Vaping Use   Vaping Use: Never used  Substance Use Topics   Alcohol use: Never    Comment: never   Drug use: No     Flowsheet Row Office Visit from 01/26/2023 in [facility]  AUDIT-C Score 0       Family History  Problem Relation Age of Onset   Clotting disorder Mother    Arthritis Mother    Anemia Mother    Irregular heart beat Mother    High Cholesterol Mother    Heart disease Father    Diabetes Father    Hypertension Father    Asthma Sister    Hypertension Sister    Diabetes Sister    Asthma Sister    Hypertension Sister    Heart attack Sister    Asthma Brother    Heart attack Brother      Blood pressure/Hypertension: BP Readings from Last 3 Encounters:  01/26/23 92/60  12/07/22 104/69  10/06/22 100/87    Weight/Obesity: Wt Readings from Last 3 Encounters:  01/26/23 162 lb 3.2 oz (73.6 kg)  12/07/22 160 lb (72.6 kg)  03/20/22 166 lb 6.4 oz (75.5 kg)   BMI Readings from Last 3 Encounters:  01/26/23 26.99 kg/m  12/07/22 26.63 kg/m  03/20/22 26.86 kg/m     Lipids:  Lab Results  Component Value Date   CHOL 160 12/10/2021   CHOL 161 12/06/2020   CHOL 164 12/07/2019   Lab Results  Component Value Date   HDL 68 12/10/2021   HDL 64 12/06/2020   HDL 55 12/07/2019   Lab Results  Component Value Date   LDLCALC 81 12/10/2021   LDLCALC 85 12/06/2020   LDLCALC 96 12/07/2019   Lab Results  Component Value Date   TRIG 38 12/10/2021   TRIG 42 12/06/2020   TRIG 45 12/07/2019   Lab Results  Component Value Date   CHOLHDL 2.4 12/10/2021   CHOLHDL 2.5 12/06/2020   CHOLHDL 3.0 12/07/2019   No  results found for: "LDLDIRECT" Based on the results of lipid panel his/her cardiovascular risk factor ( using [place] )  in the next 10 years is: The 10-year ASCVD risk score (Arnett DK, et al., 2019) is: 0.1%   Values used to calculate the  score:     Age: 6 years     Sex: Female     Is Non-Hispanic African American: Yes     Diabetic: No     Tobacco smoker: No     Systolic Blood Pressure: 92 mmHg     Is BP treated: No     HDL Cholesterol: 68 mg/dL     Total Cholesterol: 160 mg/dL  Glucose:  Glucose  Date Value Ref Range Status  02/17/2013 88 65 - 99 mg/dL Final  05/13/2012 82 65 - 99 mg/dL Final   Glucose, Bld  Date Value Ref Range Status  12/10/2021 86 65 - 99 mg/dL Final    Comment:    .            Fasting reference interval .   12/06/2020 78 65 - 99 mg/dL Final    Comment:    .            Fasting reference interval .   01/01/2020 92 70 - 99 mg/dL Final    Advanced Care Planning:  A voluntary discussion about advance care planning including the explanation and discussion of advance directives.   Discussed health care proxy and Living will, and the patient was able to identify a health care proxy as spouse.   Patient does have a living will at present time.   Social History       Social History   Socioeconomic History   Marital status: Married    Spouse name: Moses   Number of children: 2   Years of education: Not on file   Highest education level: Bachelor's degree (e.g., BA, AB, BS)  Occupational History   Occupation: real state appraisal  Tobacco Use   Smoking status: Never   Smokeless tobacco: Never  Vaping Use   Vaping Use: Never used  Substance and Sexual Activity   Alcohol use: Never    Comment: never   Drug use: No   Sexual activity: Yes    Partners: Male    Birth control/protection: Other-see comments    Comment: Tubal Ligation  Other Topics Concern   Not on file  Social History Narrative   Married and has two children, last one  born in 2017    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   Social Determinants of Health   Financial Resource Strain: [place]  (01/26/2023)   Overall Financial Resource Strain (CARDIA)    Difficulty of Paying Living Expenses: Not hard at all  Food Insecurity: No [place] (01/26/2023)   Hunger Vital Sign    Worried About Running Out of Food in the Last Year: Never true    [place] in the Last Year: Never true  Transportation Needs: No Transportation Needs (01/26/2023)   PRAPARE - Hydrologist (Medical): No    Lack of Transportation (Non-Medical): No  Physical Activity: Inactive (01/26/2023)   Exercise Vital Sign    Days of Exercise per Week: 0 days    Minutes of Exercise per Session: 0 min  Stress: Stress Concern Present (01/26/2023)   [place]    Feeling of Stress : Rather much  Social Connections: Socially Integrated (01/26/2023)   Social Connection and Isolation Panel [NHANES]    Frequency of Communication with Friends and Family: More than three times a week    Frequency of Social Gatherings with Friends and Family: Twice a week    Attends  Religious Services: 1 to 4 times per year    Active Member of Clubs or Organizations: Yes    Attends Archivist Meetings: More than 4 times per year    Marital Status: Married    Family History        Family History  Problem Relation Age of Onset   Clotting disorder Mother    Arthritis Mother    Anemia Mother    Irregular heart beat Mother    High Cholesterol Mother    Heart disease Father    Diabetes Father    Hypertension Father    Asthma Sister    Hypertension Sister    Diabetes Sister    Asthma Sister    Hypertension Sister    Heart attack Sister    Asthma Brother    Heart attack Brother     Patient Active Problem List   Diagnosis Date Noted   Iron deficiency anemia 12/16/2021   History of pneumonia 12/06/2020    Hyperglycemia 01/31/2020   Pericardial effusion 12/31/2019   Vitamin D deficiency 01/03/2019   Family history of heart disease in female family member before age 11 07/04/2016   MVP (mitral valve prolapse) 10/28/2015   Migraine headache 10/25/2015   H/O cesarean section 12/02/2012   History of blood transfusion 12/02/2012    Past Surgical History:  Procedure Laterality Date   CESAREAN SECTION  04/30/2016   04/16/2012   TUBAL LIGATION Bilateral 04/30/2016     Current Outpatient Medications:    Multiple Vitamins-Minerals (MULTIVITAMIN GUMMIES WOMENS PO), Take 2 each by mouth daily. VitaFusion Organic, Disp: , Rfl:    Cholecalciferol 25 MCG (1000 UT) tablet, Take by mouth. (Patient not taking: Reported on 01/26/2023), Disp: , Rfl:    trimethoprim-polymyxin b (POLYTRIM) ophthalmic solution, Place 1 drop into both eyes every 4 (four) hours. (Patient not taking: Reported on 01/26/2023), Disp: 10 mL, Rfl: 0  Allergies  Allergen Reactions   Dilaudid [Hydromorphone Hcl] Swelling    Patient Care Team: Steele Sizer, MD as PCP - General (Family Medicine) Yolonda Kida, MD as Consulting Physician (Cardiology) Anabel Bene, MD as Referring Physician (Neurology) Earlie Server, MD as Consulting Physician (Hematology and Oncology)   Chart Review: I personally reviewed active problem list, medication list, allergies, family history, social history, health maintenance, notes from last encounter, lab results, imaging with the patient/caregiver today.   Review of Systems  Constitutional: Negative.   HENT: Negative.    Eyes: Negative.   Respiratory: Negative.    Cardiovascular: Negative.   Gastrointestinal: Negative.   Endocrine: Negative.   Genitourinary: Negative.   Musculoskeletal: Negative.   Skin: Negative.   Allergic/Immunologic: Negative.   Neurological: Negative.   Hematological: Negative.   Psychiatric/Behavioral: Negative.    All other systems reviewed and are  negative.         Objective:   Vitals:  Vitals:   01/26/23 0921  BP: 92/60  Pulse: 78  Resp: 16  Temp: 97.7 F (36.5 C)  TempSrc: Oral  SpO2: 99%  Weight: 162 lb 3.2 oz (73.6 kg)  Height: '5\' 5"'$  (1.651 m)    Body mass index is 26.99 kg/m.  Physical Exam Vitals and nursing note reviewed.  Constitutional:      General: She is not in acute distress.    Appearance: Normal appearance. She is well-developed and well-groomed. She is not ill-appearing, toxic-appearing or diaphoretic.  HENT:     Head: Normocephalic and atraumatic.     Right Ear: Tympanic  membrane, ear canal and external ear normal.     Left Ear: Tympanic membrane, ear canal and external ear normal.     Nose: Nose normal. No mucosal edema, congestion or rhinorrhea.     Mouth/Throat:     Mouth: Mucous membranes are moist.     Pharynx: Oropharynx is clear. Uvula midline. No pharyngeal swelling, oropharyngeal exudate, posterior oropharyngeal erythema or uvula swelling.     Tonsils: No tonsillar exudate.  Eyes:     General: Lids are normal. No scleral icterus.       Right eye: No discharge.        Left eye: No discharge.     Conjunctiva/sclera: Conjunctivae normal.  Neck:     Trachea: Phonation normal. No tracheal deviation.  Cardiovascular:     Rate and Rhythm: Normal rate and regular rhythm.     Pulses: Normal pulses.          Radial pulses are 2+ on the right side and 2+ on the left side.       Posterior tibial pulses are 2+ on the right side and 2+ on the left side.     Heart sounds: Normal heart sounds. No murmur heard.    No friction rub. No gallop.  Pulmonary:     Effort: Pulmonary effort is normal. No respiratory distress.     Breath sounds: Normal breath sounds. No stridor. No wheezing, rhonchi or rales.  Chest:     Chest wall: No tenderness.  Abdominal:     General: Bowel sounds are normal. There is no distension.     Palpations: Abdomen is soft.     Tenderness: There is no abdominal tenderness.  There is no right CVA tenderness, left CVA tenderness, guarding or rebound.  Musculoskeletal:        General: No deformity.     Cervical back: Normal range of motion.     Right lower leg: No edema.     Left lower leg: No edema.  Lymphadenopathy:     Cervical: No cervical adenopathy.  Skin:    General: Skin is warm and dry.     Capillary Refill: Capillary refill takes less than 2 seconds.     Coloration: Skin is not pale.     Findings: No rash.     Comments: Alopecia along front hairline that pt shows me, thinned eyebrows Lower extremities dry skin w/o visualized hair growth (only ankles visible  Neurological:     Mental Status: She is alert and oriented to person, place, and time.     Motor: No abnormal muscle tone.     Gait: Gait normal.  Psychiatric:        Mood and Affect: Mood normal.        Speech: Speech normal.        Behavior: Behavior normal. Behavior is cooperative.       Fall Risk:    01/26/2023    9:20 AM 12/10/2021    9:05 AM 12/06/2020    9:32 AM 04/16/2020    2:28 PM 12/06/2019    9:03 AM  Fall Risk   Falls in the past year? 0 0 0 0 0  Number falls in past yr: 0 0 0 0 0  Injury with Fall? 0 0 0 0 0  Risk for fall due to : No Fall Risks No Fall Risks     Follow up Falls prevention discussed;Education provided;Falls evaluation completed Falls prevention discussed       Functional Status  Survey: Is the patient deaf or have difficulty hearing?: No Does the patient have difficulty seeing, even when wearing glasses/contacts?: Yes Does the patient have difficulty concentrating, remembering, or making decisions?: Yes Does the patient have difficulty walking or climbing stairs?: No Does the patient have difficulty dressing or bathing?: No Does the patient have difficulty doing errands alone such as visiting a doctor's office or shopping?: No   Assessment & Plan:    CPE completed today  USPSTF grade A and B recommendations reviewed with patient; age-appropriate  recommendations, preventive care, screening tests, etc discussed and encouraged; healthy living encouraged; see AVS for patient education given to patient  Discussed importance of 150 minutes of physical activity weekly, AHA exercise recommendations given to pt in AVS/handout  Discussed importance of healthy diet:  eating lean meats and proteins, avoiding trans fats and saturated fats, avoid simple sugars and excessive carbs in diet, eat 6 servings of fruit/vegetables daily and drink plenty of water and avoid sweet beverages.    Recommended pt to do annual eye exam and routine dental exams/cleanings  Depression, alcohol, fall screening completed as documented above and per flowsheets  Advance Care planning information and packet discussed and offered today, encouraged pt to discuss with family members/spouse/partner/friends and complete Advanced directive packet and bring copy to office   Reviewed Health Maintenance: Health Maintenance  Topic Date Due   MAMMOGRAM  Never done   COVID-19 Vaccine (4 - 2023-24 season) 02/11/2023 (Originally 07/25/2022)   INFLUENZA VACCINE  02/22/2023 (Originally 06/24/2022)   PAP SMEAR-Modifier  12/07/2023   DTaP/Tdap/Td (2 - Td or Tdap) 02/26/2026   Hepatitis C Screening  Completed   HIV Screening  Completed   HPV VACCINES  Aged Out    Immunizations: Immunization History  Administered Date(s) Administered   Influenza,inj,Quad PF,6+ Mos 11/10/2017, 12/03/2018, 12/06/2020, 12/10/2021   Influenza-Unspecified 12/02/2012   PFIZER(Purple Top)SARS-COV-2 Vaccination 03/30/2020, 04/28/2020, 11/15/2020   Pneumococcal Polysaccharide-23 12/06/2020   Tdap 02/27/2016       ICD-10-CM   1. Annual physical exam  123456 COMPLETE METABOLIC PANEL WITH GFR    CBC with Differential/Platelet    Lipid panel    Vitamin B12    TSH    Hemoglobin A1c    2. Encounter for screening mammogram for malignant neoplasm of breast  Z12.31 MM 3D SCREEN BREAST BILATERAL    3. Iron  deficiency anemia due to chronic blood loss  D50.0 CBC with Differential/Platelet    Iron, TIBC and Ferritin Panel    Vitamin B12    TSH   does labs with PCP and hematology, having heavy mentral cycles monthly, last hematology OV/labs last April Last hem/onc note said pt was to come for 3 month f/up - she hasn't been seen since and no labs done     4. Alopecia  L65.9    hair loss from eyebrows, hairline severe loss, and body hair w/o rash and no skin or weight changes Likely some iron deficiency contributing - pt wishes to consult with specialists -derm referral ordered    5. Chronic rhinitis  J31.0    she has consulted with two ENT w/o any causitive allergies or effective treatment    6. Menorrhagia with regular cycle  N92.0   Chronic blood loss - she was due to f/up with OBGYN last year but never got established Will need to enter referral again     7. Cervical high risk HPV (human papillomavirus) test positive  R87.810    last cotesting 2022 -  normal pap, [facility]P algorhythm/calculator with her hx advises 1 year repeat cotesting (looked up all hx/calculator at end of exam)  I explained that per the new guidelines and algorithm with her hx in chart she should have a recheck - can come in for just that or she may wish to f/up with GYN since she will be establishing with them for f/up on heavy periods with IDA, hx of cervical polyps  Would need a recheck prior cotesting first         1 year f/up CPE    Delsa Grana, PA-C 01/26/23 9:56 AM  [place]

## 2023-01-27 LAB — TSH: TSH: 0.68 mIU/L

## 2023-01-27 LAB — CBC WITH DIFFERENTIAL/PLATELET
Absolute Monocytes: 260 cells/uL (ref 200–950)
Basophils Absolute: 31 cells/uL (ref 0–200)
Basophils Relative: 1 %
Eosinophils Absolute: 121 cells/uL (ref 15–500)
Eosinophils Relative: 3.9 %
HCT: 34.7 % — ABNORMAL LOW (ref 35.0–45.0)
Hemoglobin: 10.8 g/dL — ABNORMAL LOW (ref 11.7–15.5)
Lymphs Abs: 1479 cells/uL (ref 850–3900)
MCH: 26 pg — ABNORMAL LOW (ref 27.0–33.0)
MCHC: 31.1 g/dL — ABNORMAL LOW (ref 32.0–36.0)
MCV: 83.4 fL (ref 80.0–100.0)
MPV: 8.9 fL (ref 7.5–12.5)
Monocytes Relative: 8.4 %
Neutro Abs: 1209 cells/uL — ABNORMAL LOW (ref 1500–7800)
Neutrophils Relative %: 39 %
Platelets: 369 10*3/uL (ref 140–400)
RBC: 4.16 10*6/uL (ref 3.80–5.10)
RDW: 13.6 % (ref 11.0–15.0)
Total Lymphocyte: 47.7 %
WBC: 3.1 10*3/uL — ABNORMAL LOW (ref 3.8–10.8)

## 2023-01-27 LAB — LIPID PANEL
Cholesterol: 153 mg/dL (ref <200)
HDL: 54 mg/dL (ref >=50)
LDL Cholesterol (Calc): 87 mg/dL (calc)
Non-HDL Cholesterol (Calc): 99 mg/dL (calc) (ref <130)
Total CHOL/HDL Ratio: 2.8 (calc) (ref <5.0)
Triglycerides: 42 mg/dL (ref <150)

## 2023-01-27 LAB — COMPLETE METABOLIC PANEL WITH GFR
AG Ratio: 1.3 (calc) (ref 1.0–2.5)
ALT: 15 U/L (ref 6–29)
AST: 15 U/L (ref 10–30)
Albumin: 4.1 g/dL (ref 3.6–5.1)
Alkaline phosphatase (APISO): 61 U/L (ref 31–125)
BUN: 9 mg/dL (ref 7–25)
CO2: 23 mmol/L (ref 20–32)
Calcium: 9.5 mg/dL (ref 8.6–10.2)
Chloride: 108 mmol/L (ref 98–110)
Creat: 0.81 mg/dL (ref 0.50–0.99)
Globulin: 3.2 g/dL (calc) (ref 1.9–3.7)
Glucose, Bld: 85 mg/dL (ref 65–99)
Potassium: 4.3 mmol/L (ref 3.5–5.3)
Sodium: 141 mmol/L (ref 135–146)
Total Bilirubin: 0.4 mg/dL (ref 0.2–1.2)
Total Protein: 7.3 g/dL (ref 6.1–8.1)
eGFR: 92 mL/min/{1.73_m2} (ref >=60)

## 2023-01-27 LAB — HEMOGLOBIN A1C
Hgb A1c MFr Bld: 5.9 % of total Hgb — ABNORMAL HIGH (ref <5.7)
Mean Plasma Glucose: 123 mg/dL
eAG (mmol/L): 6.8 mmol/L

## 2023-01-27 LAB — IRON,TIBC AND FERRITIN PANEL
%SAT: 19 % (calc) (ref 16–45)
Ferritin: 4 ng/mL — ABNORMAL LOW (ref 16–232)
Iron: 69 ug/dL (ref 40–190)
TIBC: 365 mcg/dL (calc) (ref 250–450)

## 2023-01-27 LAB — VITAMIN B12: Vitamin B-12: 318 pg/mL (ref 200–1100)

## 2023-01-27 NOTE — Addendum Note (Signed)
Addended by: Delsa Grana on: 01/27/2023 06:29 PM   Modules accepted: Orders

## 2023-01-28 NOTE — Progress Notes (Signed)
Spoke with pt and she scheduled her Katie Hays, however she is wanting someone to return her call for her test results.

## 2023-02-25 ENCOUNTER — Encounter: Payer: 59 | Admitting: Obstetrics

## 2023-03-05 ENCOUNTER — Ambulatory Visit
Admission: RE | Admit: 2023-03-05 | Discharge: 2023-03-05 | Disposition: A | Discharge status: Home / Self Care | Payer: 59 | Source: Ambulatory Visit | Attending: Family Medicine | Admitting: Family Medicine

## 2023-03-05 DIAGNOSIS — Z1231 Encounter for screening mammogram for malignant neoplasm of breast: Secondary | ICD-10-CM | POA: Insufficient documentation

## 2023-03-06 ENCOUNTER — Other Ambulatory Visit: Payer: Self-pay | Admitting: Family Medicine

## 2023-03-06 DIAGNOSIS — N6489 Other specified disorders of breast: Secondary | ICD-10-CM

## 2023-03-06 DIAGNOSIS — R928 Other abnormal and inconclusive findings on diagnostic imaging of breast: Secondary | ICD-10-CM

## 2023-03-11 ENCOUNTER — Ambulatory Visit
Admission: RE | Admit: 2023-03-11 | Discharge: 2023-03-11 | Disposition: A | Discharge status: Home / Self Care | Payer: 59 | Source: Ambulatory Visit | Attending: Family Medicine | Admitting: Family Medicine

## 2023-03-11 DIAGNOSIS — R928 Other abnormal and inconclusive findings on diagnostic imaging of breast: Secondary | ICD-10-CM | POA: Insufficient documentation

## 2023-03-11 DIAGNOSIS — N6489 Other specified disorders of breast: Secondary | ICD-10-CM | POA: Diagnosis not present

## 2023-03-13 ENCOUNTER — Encounter: Payer: Self-pay | Admitting: Oncology

## 2023-03-13 ENCOUNTER — Inpatient Hospital Stay: Payer: 59 | Attending: Oncology

## 2023-03-13 ENCOUNTER — Inpatient Hospital Stay (HOSPITAL_BASED_OUTPATIENT_CLINIC_OR_DEPARTMENT_OTHER): Payer: 59 | Admitting: Oncology

## 2023-03-13 VITALS — BP 102/50 | HR 63 | Temp 97.4°F | Resp 18 | Wt 164.3 lb

## 2023-03-13 VITALS — BP 105/56 | HR 62 | Temp 97.5°F | Resp 16

## 2023-03-13 DIAGNOSIS — D509 Iron deficiency anemia, unspecified: Secondary | ICD-10-CM | POA: Insufficient documentation

## 2023-03-13 DIAGNOSIS — D500 Iron deficiency anemia secondary to blood loss (chronic): Secondary | ICD-10-CM | POA: Diagnosis not present

## 2023-03-13 DIAGNOSIS — Z9851 Tubal ligation status: Secondary | ICD-10-CM | POA: Insufficient documentation

## 2023-03-13 DIAGNOSIS — R5383 Other fatigue: Secondary | ICD-10-CM | POA: Insufficient documentation

## 2023-03-13 DIAGNOSIS — Z803 Family history of malignant neoplasm of breast: Secondary | ICD-10-CM | POA: Insufficient documentation

## 2023-03-13 DIAGNOSIS — N920 Excessive and frequent menstruation with regular cycle: Secondary | ICD-10-CM | POA: Insufficient documentation

## 2023-03-13 MED ORDER — SODIUM CHLORIDE 0.9 % IV SOLN
200.0000 mg | Freq: Once | INTRAVENOUS | Status: AC
  Administered 2023-03-13: 200 mg via INTRAVENOUS
  Filled 2023-03-13: qty 200

## 2023-03-13 MED ORDER — SODIUM CHLORIDE 0.9 % IV SOLN
Freq: Once | INTRAVENOUS | Status: AC
  Filled 2023-03-13: qty 250

## 2023-03-13 NOTE — Progress Notes (Signed)
Hematology/Oncology Progress note [phone] [phone]   CHIEF COMPLAINTS/REASON FOR VISIT:  iron deficiency anemia.  ASSESSMENT & PLAN:   Iron deficiency anemia Labs are reviewed and discussed with patient. Lab Results  Component Value Date   TIBC 365 01/26/2023   IRONPCTSAT 19 01/26/2023   FERRITIN 4 (L) 01/26/2023   HGB 10.8 (L) 01/26/2023   Consistent with iron deficiency anemia.  Recommend IV Venfoer weekly x 4   Orders Placed This Encounter  Procedures   CBC with Differential (Cancer Center Only)    Standing Status:   Future    Standing Expiration Date:   03/12/2024   Iron and TIBC    Standing Status:   Future    Standing Expiration Date:   03/12/2024   Ferritin    Standing Status:   Future    Standing Expiration Date:   03/12/2024   Retic Panel    Standing Status:   Future    Standing Expiration Date:   03/12/2024   RTC in 4 months.   All questions were answered. The patient knows to call the clinic with any problems, questions or concerns.  Rickard Patience, MD, PhD [facility] Hematology Oncology 03/13/2023   HISTORY OF PRESENTING ILLNESS:   Katie Hays is a  42 y.o.  female with PMH listed below was seen in consultation at the request of  Alba Cory, MD  for evaluation of iron deficiency anemia  12/10/2021, CBC showed hemoglobin 9.8, MCV 80.6.  Platelet 332,000, white count of 3.1.  Normal differential. Iron panel showed ferritin of 3, iron saturation 9, TIBC 401. Reviewed patient's previous blood work, patient seems to have chronic iron deficiency anemia.  With ferritin chronically at the level less than 10.  Normal folate and vitamin B12 level. Patient reports that she has tried oral iron supplementation not able to remember taking it due to busy work schedules.  She works as a Engineer, manufacturing systems.  Patient reports feeling tired and fatigued.  She has heavy menstrual periods.  History of tubal ligation.  She has an  appointment with gynecology in the upcoming weeks. Patient has history of meningitis.  Also prior history of pericardial effusion.  Patient reports that she follows up with cardiology.  She was last seen by her cardiology Dr. Laural Benes in February 2021. Denies black tarry stool or bloody stool.  Denies family history of colon cancer.  INTERVAL HISTORY Katie Hays is a 41 y.o. female who has above history reviewed by me today presents for follow up visit for iron deficiency anemia. She previously tolerated IV Venofer treatments.  She feels more tired recently. + menorrhagia.    Review of Systems  Constitutional:  Positive for fatigue. Negative for appetite change, chills and fever.  HENT:   Negative for hearing loss and voice change.   Eyes:  Negative for eye problems.  Respiratory:  Negative for chest tightness and cough.   Cardiovascular:  Negative for chest pain.  Gastrointestinal:  Negative for abdominal distention, abdominal pain and blood in stool.  Endocrine: Negative for hot flashes.  Genitourinary:  Positive for menstrual problem. Negative for difficulty urinating and frequency.   Musculoskeletal:  Negative for arthralgias.  Skin:  Negative for itching and rash.  Neurological:  Negative for extremity weakness.  Hematological:  Negative for adenopathy.  Psychiatric/Behavioral:  Negative for confusion.     MEDICAL HISTORY:  Past Medical History:  Diagnosis Date   Anemia    Chronic nonintractable headache  History of meningitis    History of pneumonia    Hyperglycemia    Pericardial effusion    Vitamin D deficiency     SURGICAL HISTORY: Past Surgical History:  Procedure Laterality Date   CESAREAN SECTION  04/30/2016   04/16/2012   TUBAL LIGATION Bilateral 04/30/2016    SOCIAL HISTORY: Social History   Socioeconomic History   Marital status: Married    Spouse name: Moses   Number of children: 2   Years of education: Not on file   Highest education level:  Bachelor's degree (e.g., BA, AB, BS)  Occupational History   Occupation: real state appraisal  Tobacco Use   Smoking status: Never   Smokeless tobacco: Never  Vaping Use   Vaping Use: Never used  Substance and Sexual Activity   Alcohol use: Never    Comment: never   Drug use: No   Sexual activity: Yes    Partners: Male    Birth control/protection: Other-see comments    Comment: Tubal Ligation  Other Topics Concern   Not on file  Social History Narrative   Married and has two children, last one born in 2017    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   Social Determinants of Health   Financial Resource Strain: Low Risk  (01/26/2023)   Overall Financial Resource Strain (CARDIA)    Difficulty of Paying Living Expenses: Not hard at all  Food Insecurity: No Food Insecurity (01/26/2023)   Hunger Vital Sign    Worried About Running Out of Food in the Last Year: Never true    Ran Out of Food in the Last Year: Never true  Transportation Needs: No Transportation Needs (01/26/2023)   PRAPARE - Administrator, Civil Service (Medical): No    Lack of Transportation (Non-Medical): No  Physical Activity: Inactive (01/26/2023)   Exercise Vital Sign    Days of Exercise per Week: 0 days    Minutes of Exercise per Session: 0 min  Stress: Stress Concern Present (01/26/2023)   [employer] of Occupational Health - Occupational Stress Questionnaire    Feeling of Stress : Rather much  Social Connections: Socially Integrated (01/26/2023)   Social Connection and Isolation Panel [NHANES]    Frequency of Communication with Friends and Family: More than three times a week    Frequency of Social Gatherings with Friends and Family: Twice a week    Attends Religious Services: 1 to 4 times per year    Active Member of [employer] or Organizations: Yes    Attends Engineer, structural: More than 4 times per year    Marital Status: Married  Catering manager Violence: Not At Risk (01/26/2023)    Humiliation, Afraid, Rape, and Kick questionnaire    Fear of Current or Ex-Partner: No    Emotionally Abused: No    Physically Abused: No    Sexually Abused: No    FAMILY HISTORY: Family History  Problem Relation Age of Onset   Clotting disorder Mother    Arthritis Mother    Anemia Mother    Irregular heart beat Mother    High Cholesterol Mother    Heart disease Father    Diabetes Father    Hypertension Father    Asthma Sister    Hypertension Sister    Diabetes Sister    Asthma Sister    Hypertension Sister    Heart attack Sister    Breast cancer Maternal Aunt    Breast cancer Cousin  Asthma Brother    Heart attack Brother     ALLERGIES:  is allergic to dilaudid [hydromorphone hcl].  MEDICATIONS:  Current Outpatient Medications  Medication Sig Dispense Refill   Multiple Vitamins-Minerals (MULTIVITAMIN GUMMIES WOMENS PO) Take 2 each by mouth daily. VitaFusion Organic     Cholecalciferol 25 MCG (1000 UT) tablet Take by mouth. (Patient not taking: Reported on 01/26/2023)     trimethoprim-polymyxin b (POLYTRIM) ophthalmic solution Place 1 drop into both eyes every 4 (four) hours. (Patient not taking: Reported on 01/26/2023) 10 mL 0   No current facility-administered medications for this visit.     PHYSICAL EXAMINATION: ECOG PERFORMANCE STATUS: 1 - Symptomatic but completely ambulatory Vitals:   03/13/23 1219  BP: (!) 102/50  Pulse: 63  Resp: 18  Temp: (!) 97.4 F (36.3 C)  SpO2: 100%   Filed Weights   03/13/23 1219  Weight: 164 lb 4.8 oz (74.5 kg)    Physical Exam Constitutional:      General: She is not in acute distress. HENT:     Head: Normocephalic and atraumatic.  Eyes:     General: No scleral icterus. Cardiovascular:     Rate and Rhythm: Normal rate and regular rhythm.     Heart sounds: Normal heart sounds.  Pulmonary:     Effort: Pulmonary effort is normal. No respiratory distress.     Breath sounds: No wheezing.  Abdominal:     General: Bowel  sounds are normal. There is no distension.     Palpations: Abdomen is soft.  Musculoskeletal:        General: No deformity. Normal range of motion.     Cervical back: Normal range of motion and neck supple.  Skin:    General: Skin is warm and dry.     Findings: No erythema or rash.  Neurological:     Mental Status: She is alert and oriented to person, place, and time. Mental status is at baseline.     Cranial Nerves: No cranial nerve deficit.     Coordination: Coordination normal.  Psychiatric:        Mood and Affect: Mood normal.     LABORATORY DATA:  I have reviewed the data as listed Lab Results  Component Value Date   WBC 3.1 (L) 01/26/2023   HGB 10.8 (L) 01/26/2023   HCT 34.7 (L) 01/26/2023   MCV 83.4 01/26/2023   PLT 369 01/26/2023   Recent Labs    01/26/23 1025  NA 141  K 4.3  CL 108  CO2 23  GLUCOSE 85  BUN 9  CREATININE 0.81  CALCIUM 9.5  PROT 7.3  AST 15  ALT 15  BILITOT 0.4    Iron/TIBC/Ferritin/ %Sat    Component Value Date/Time   IRON 69 01/26/2023 1025   TIBC 365 01/26/2023 1025   FERRITIN 4 (L) 01/26/2023 1025   IRONPCTSAT 19 01/26/2023 1025       RADIOGRAPHIC STUDIES: I have personally reviewed the radiological images as listed and agreed with the findings in the report. MM 3D DIAGNOSTIC MAMMOGRAM UNILATERAL RIGHT BREAST  Result Date: 03/11/2023 CLINICAL DATA:  Callback for RIGHT breast asymmetry from baseline mammogram EXAM: DIGITAL DIAGNOSTIC UNILATERAL RIGHT MAMMOGRAM WITH TOMOSYNTHESIS TECHNIQUE: Right digital diagnostic mammography and breast tomosynthesis was performed. COMPARISON:  Previous exam(s). ACR Breast Density Category c: The breasts are heterogeneously dense, which may obscure small masses. FINDINGS: The previously described finding does not persist with additional views, consistent with superimposed fibroglandular tissue. No suspicious mass, microcalcification,  or other finding is identified. IMPRESSION: No mammographic  evidence of malignancy. RECOMMENDATION: Screening mammogram in one year.(Code:SM-B-01Y) I have discussed the findings and recommendations with the patient. If applicable, a reminder letter will be sent to the patient regarding the next appointment. BI-RADS CATEGORY  1: Negative. Electronically Signed   By: Meda Klinefelter M.D.   On: 03/11/2023 13:04   MM 3D SCREEN BREAST BILATERAL  Result Date: 03/06/2023 CLINICAL DATA:  Screening. EXAM: DIGITAL SCREENING BILATERAL MAMMOGRAM WITH TOMOSYNTHESIS AND CAD TECHNIQUE: Bilateral screening digital craniocaudal and mediolateral oblique mammograms were obtained. Bilateral screening digital breast tomosynthesis was performed. The images were evaluated with computer-aided detection. COMPARISON:  None. ACR Breast Density Category c: The breasts are heterogeneously dense, which may obscure small masses. FINDINGS: In the right breast, a possible asymmetry warrants further evaluation. In the left breast, no findings suspicious for malignancy. IMPRESSION: Further evaluation is suggested for possible asymmetry in the right breast. RECOMMENDATION: Diagnostic mammogram and possibly ultrasound of the right breast. (Code:FI-R-44M) The patient will be contacted regarding the findings, and additional imaging will be scheduled. BI-RADS CATEGORY  0: Incomplete: Need additional imaging evaluation. Electronically Signed   By: Edwin Cap M.D.   On: 03/06/2023 12:16

## 2023-03-13 NOTE — Assessment & Plan Note (Signed)
Labs are reviewed and discussed with patient. Lab Results  Component Value Date   TIBC 365 01/26/2023   IRONPCTSAT 19 01/26/2023   FERRITIN 4 (L) 01/26/2023   HGB 10.8 (L) 01/26/2023   Consistent with iron deficiency anemia.  Recommend IV Venfoer weekly x 4

## 2023-03-19 MED FILL — Iron Sucrose Inj 20 MG/ML (Fe Equiv): INTRAVENOUS | Qty: 10 | Status: AC

## 2023-03-20 ENCOUNTER — Inpatient Hospital Stay: Payer: 59

## 2023-03-25 MED FILL — Iron Sucrose Inj 20 MG/ML (Fe Equiv): INTRAVENOUS | Qty: 10 | Status: AC

## 2023-03-26 ENCOUNTER — Inpatient Hospital Stay: Payer: 59

## 2023-04-02 ENCOUNTER — Encounter: Payer: Self-pay | Admitting: Family Medicine

## 2023-04-02 ENCOUNTER — Encounter: Payer: Self-pay | Admitting: Oncology

## 2023-04-02 ENCOUNTER — Ambulatory Visit: Payer: 59 | Admitting: Family Medicine

## 2023-04-02 VITALS — BP 110/68 | HR 82 | Temp 98.1°F | Resp 16 | Ht 65.0 in | Wt 168.5 lb

## 2023-04-02 DIAGNOSIS — N920 Excessive and frequent menstruation with regular cycle: Secondary | ICD-10-CM | POA: Diagnosis not present

## 2023-04-02 DIAGNOSIS — E559 Vitamin D deficiency, unspecified: Secondary | ICD-10-CM

## 2023-04-02 DIAGNOSIS — D500 Iron deficiency anemia secondary to blood loss (chronic): Secondary | ICD-10-CM

## 2023-04-02 DIAGNOSIS — E538 Deficiency of other specified B group vitamins: Secondary | ICD-10-CM

## 2023-04-02 DIAGNOSIS — R238 Other skin changes: Secondary | ICD-10-CM

## 2023-04-02 MED FILL — Iron Sucrose Inj 20 MG/ML (Fe Equiv): INTRAVENOUS | Qty: 10 | Status: AC

## 2023-04-02 NOTE — Progress Notes (Signed)
Patient ID: Katie Hays, female    DOB: 1980-12-05, 42 y.o.   MRN: [id number]  PCP: Alba Cory, MD  Chief Complaint  Patient presents with   Hand Problem    Bilateral hands turn orange onset for a month on/off. No sx    Subjective:   Katie Hays is a 42 y.o. female, presents to clinic with CC of the following:  HPI palms of hands getting dark orange/red on and off for the last month or two  Lasts for about a week or two then gradually the color lightens and goes away for a week.  The last two weeks she had it return and only recently just starting to lighten up again  She denies any new medications/antibiotics, lotions soaps or detergents.  She has not had this color change anywhere else on her body.  No rash or skin peeling Denies any discoloration or pigment in her sweat, on her close No abdominal pain, nausea, vomiting No urine or stool color changes She did just reestablish with hematology for iron infusions for chronic iron deficiency anemia she states the orange and red discoloration on her palms occurred before she resumed iron infusions She is not eating a lot of carrots or leafy green vegetables, no new supplements or vitamins    Patient Active Problem List   Diagnosis Date Noted   Iron deficiency anemia 12/16/2021   History of pneumonia 12/06/2020   Hyperglycemia 01/31/2020   Pericardial effusion 12/31/2019   Vitamin D deficiency 01/03/2019   Family history of heart disease in female family member before age 35 07/04/2016   MVP (mitral valve prolapse) 10/28/2015   Migraine headache 10/25/2015   Cervical high risk HPV (human papillomavirus) test positive 07/18/2015   H/O cesarean section 12/02/2012   History of blood transfusion 12/02/2012      Current Outpatient Medications:    Cholecalciferol 25 MCG (1000 UT) tablet, Take by mouth., Disp: , Rfl:    Multiple Vitamins-Minerals (MULTIVITAMIN GUMMIES WOMENS PO), Take 2 each by mouth daily.  VitaFusion Organic, Disp: , Rfl:    trimethoprim-polymyxin b (POLYTRIM) ophthalmic solution, Place 1 drop into both eyes every 4 (four) hours., Disp: 10 mL, Rfl: 0   Allergies  Allergen Reactions   Dilaudid [Hydromorphone Hcl] Swelling     Social History   Tobacco Use   Smoking status: Never   Smokeless tobacco: Never  Vaping Use   Vaping Use: Never used  Substance Use Topics   Alcohol use: Never    Comment: never   Drug use: No      Chart Review Today: I personally reviewed active problem list, medication list, allergies, family history, social history, health maintenance, notes from last encounter, lab results, imaging with the patient/caregiver today.   Review of Systems  Constitutional: Negative.   HENT: Negative.    Eyes: Negative.   Respiratory: Negative.    Cardiovascular: Negative.   Gastrointestinal: Negative.   Endocrine: Negative.   Genitourinary: Negative.   Musculoskeletal: Negative.   Skin: Negative.   Allergic/Immunologic: Negative.   Neurological: Negative.   Hematological: Negative.   Psychiatric/Behavioral: Negative.    All other systems reviewed and are negative.      Objective:   Vitals:   04/02/23 0821  BP: 110/68  Pulse: 82  Resp: 16  Temp: 98.1 F (36.7 C)  TempSrc: Oral  SpO2: 99%  Weight: 168 lb 8 oz (76.4 kg)  Height: 5\' 5"  (1.651 m)    Body mass index  is 28.04 kg/m.  Physical Exam Vitals and nursing note reviewed.  Constitutional:      General: She is not in acute distress.    Appearance: Normal appearance. She is well-developed. She is not ill-appearing, toxic-appearing or diaphoretic.  HENT:     Head: Normocephalic and atraumatic.     Nose: Nose normal.     Mouth/Throat:     Mouth: Mucous membranes are moist.     Pharynx: Oropharynx is clear. No oropharyngeal exudate or posterior oropharyngeal erythema.  Eyes:     General: No scleral icterus.       Right eye: No discharge.        Left eye: No discharge.      Conjunctiva/sclera: Conjunctivae normal.  Neck:     Trachea: No tracheal deviation.  Cardiovascular:     Rate and Rhythm: Normal rate and regular rhythm.  Pulmonary:     Effort: Pulmonary effort is normal. No respiratory distress.     Breath sounds: No stridor.  Musculoskeletal:        General: Normal range of motion.  Skin:    General: Skin is warm and dry.     Capillary Refill: Capillary refill takes less than 2 seconds.     Findings: No rash.     Comments: Palms of hands with orange discoloration b/l Small visible portion of nail beds normal/pink Souls of feet normal color No rash  Neurological:     Mental Status: She is alert.     Motor: No abnormal muscle tone.     Coordination: Coordination normal.  Psychiatric:        Behavior: Behavior normal.       Results for orders placed or performed in visit on 01/26/23  COMPLETE METABOLIC PANEL WITH GFR  Result Value Ref Range   Glucose, Bld 85 65 - 99 mg/dL   BUN 9 7 - 25 mg/dL   Creat 2.84 1.32 - 4.40 mg/dL   eGFR 92 > OR = 60 NU/UVO/5.36U4   BUN/Creatinine Ratio SEE NOTE: 6 - 22 (calc)   Sodium 141 135 - 146 mmol/L   Potassium 4.3 3.5 - 5.3 mmol/L   Chloride 108 98 - 110 mmol/L   CO2 23 20 - 32 mmol/L   Calcium 9.5 8.6 - 10.2 mg/dL   Total Protein 7.3 6.1 - 8.1 g/dL   Albumin 4.1 3.6 - 5.1 g/dL   Globulin 3.2 1.9 - 3.7 g/dL (calc)   AG Ratio 1.3 1.0 - 2.5 (calc)   Total Bilirubin 0.4 0.2 - 1.2 mg/dL   Alkaline phosphatase (APISO) 61 31 - 125 U/L   AST 15 10 - 30 U/L   ALT 15 6 - 29 U/L  CBC with Differential/Platelet  Result Value Ref Range   WBC 3.1 (L) 3.8 - 10.8 Thousand/uL   RBC 4.16 3.80 - 5.10 Million/uL   Hemoglobin 10.8 (L) 11.7 - 15.5 g/dL   HCT 40.3 (L) 47.4 - 25.9 %   MCV 83.4 80.0 - 100.0 fL   MCH 26.0 (L) 27.0 - 33.0 pg   MCHC 31.1 (L) 32.0 - 36.0 g/dL   RDW 56.3 87.5 - 64.3 %   Platelets 369 140 - 400 Thousand/uL   MPV 8.9 7.5 - 12.5 fL   Neutro Abs 1,209 (L) 1,500 - 7,800 cells/uL   Lymphs  Abs 1,479 850 - 3,900 cells/uL   Absolute Monocytes 260 200 - 950 cells/uL   Eosinophils Absolute 121 15 - 500 cells/uL   Basophils Absolute 31  0 - 200 cells/uL   Neutrophils Relative % 39 %   Total Lymphocyte 47.7 %   Monocytes Relative 8.4 %   Eosinophils Relative 3.9 %   Basophils Relative 1.0 %  Lipid panel  Result Value Ref Range   Cholesterol 153 <200 mg/dL   HDL 54 > OR = 50 mg/dL   Triglycerides 42 <161 mg/dL   LDL Cholesterol (Calc) 87 mg/dL (calc)   Total CHOL/HDL Ratio 2.8 <5.0 (calc)   Non-HDL Cholesterol (Calc) 99 <096 mg/dL (calc)  Iron, TIBC and Ferritin Panel  Result Value Ref Range   Iron 69 40 - 190 mcg/dL   TIBC 045 409 - 811 mcg/dL (calc)   %SAT 19 16 - 45 % (calc)   Ferritin 4 (L) 16 - 232 ng/mL  Vitamin B12  Result Value Ref Range   Vitamin B-12 318 200 - 1,100 pg/mL  TSH  Result Value Ref Range   TSH 0.68 mIU/L  Hemoglobin A1c  Result Value Ref Range   Hgb A1c MFr Bld 5.9 (H) <5.7 % of total Hgb   Mean Plasma Glucose 123 mg/dL   eAG (mmol/L) 6.8 mmol/L       Assessment & Plan:     ICD-10-CM   1. Change of skin color  R23.8 COMPLETE METABOLIC PANEL WITH GFR    Urinalysis, Routine w reflex microscopic    CBC with Differential/Platelet    Iron, TIBC and Ferritin Panel    Lactate dehydrogenase    Ambulatory referral to Dermatology   intermittent off and on for ~2 months, lasting 2 wks at a time, no meds, supplements, foods, lotions, no sx elsewhere on body    2. Low serum vitamin B12  E53.8 CBC with Differential/Platelet    3. Vitamin D deficiency  E55.9     4. Iron deficiency anemia due to chronic blood loss  D50.0 CBC with Differential/Platelet    Iron, TIBC and Ferritin Panel   seeing Dr. Cathie Hoops for iron infusions, just restarted in the last month    5. Menorrhagia with regular cycle  N92.0    is supposed to f/up with GYN      Consulted with all providers in clinic today regarding case/Sx and with Pt PCP Dr. Carlynn Purl - suspects it would be  most likely a contact with something causing it or diet Some labs will be checked Will have pt monitor for possible contact with items that could cause pigment change/discoloration (construction/realestate/material/chemicals) monitor diet for amount of carrots/dark leafy greens, check any and all supplements/vitamins   I sent msg/ref to Dr. Cheree Ditto -dermatology Pt has upcoming appt she states  Reassuring that there are no sx anywhere else on body -does not seem to be systemic (intraoral, ocular/scleral, souls of feet etc) there is no signs of jaundice or scleral icterus, no GI symptoms nausea vomiting urine or stool changes, no weight loss, no rash/itching to skin  patient reports the symptoms started prior to reestablishing with hematology and resuming iron infusions -so unlikely to be related to her chronic iron deficiency anemia from chronic blood loss with recent iron infusions.  Pt will be updated with plan/labs - discussed with PCP after pt left office today I will also reach out to Dr. Cathie Hoops Labs -  LDH, bili, LFTs, blood counts, iron levels, screen urine   F/up pending labs and any input from derm/hematology  Can f/up with PCP in 2-4 weeks after monitoring   Danelle Berry, PA-C 04/02/23 8:49 AM

## 2023-04-03 ENCOUNTER — Inpatient Hospital Stay: Payer: 59

## 2023-04-06 ENCOUNTER — Inpatient Hospital Stay: Payer: 59 | Attending: Oncology

## 2023-04-06 VITALS — BP 111/39 | HR 71 | Temp 98.3°F | Resp 16

## 2023-04-06 DIAGNOSIS — D509 Iron deficiency anemia, unspecified: Secondary | ICD-10-CM | POA: Diagnosis not present

## 2023-04-06 DIAGNOSIS — L630 Alopecia (capitis) totalis: Secondary | ICD-10-CM | POA: Diagnosis not present

## 2023-04-06 DIAGNOSIS — Z79899 Other long term (current) drug therapy: Secondary | ICD-10-CM | POA: Diagnosis not present

## 2023-04-06 MED ORDER — SODIUM CHLORIDE 0.9 % IV SOLN
200.0000 mg | Freq: Once | INTRAVENOUS | Status: AC
  Administered 2023-04-06: 200 mg via INTRAVENOUS
  Filled 2023-04-06: qty 200

## 2023-04-06 MED ORDER — SODIUM CHLORIDE 0.9 % IV SOLN
INTRAVENOUS | Status: DC
  Filled 2023-04-06: qty 250

## 2023-04-06 NOTE — Patient Instructions (Signed)

## 2023-04-08 DIAGNOSIS — Z79899 Other long term (current) drug therapy: Secondary | ICD-10-CM | POA: Diagnosis not present

## 2023-04-09 MED FILL — Iron Sucrose Inj 20 MG/ML (Fe Equiv): INTRAVENOUS | Qty: 10 | Status: AC

## 2023-04-10 ENCOUNTER — Inpatient Hospital Stay: Payer: 59

## 2023-04-21 ENCOUNTER — Encounter: Payer: 59 | Admitting: Obstetrics and Gynecology

## 2023-04-27 DIAGNOSIS — Z79899 Other long term (current) drug therapy: Secondary | ICD-10-CM | POA: Diagnosis not present

## 2023-04-28 ENCOUNTER — Telehealth: Payer: Self-pay | Admitting: Oncology

## 2023-04-28 NOTE — Telephone Encounter (Signed)
Pt left VM stating she would like Dr. Cathie Hoops to call her directly at [phone]

## 2023-04-30 ENCOUNTER — Encounter: Payer: Self-pay | Admitting: Oncology

## 2023-05-29 ENCOUNTER — Encounter: Payer: Self-pay | Admitting: Oncology

## 2023-06-08 NOTE — Progress Notes (Unsigned)
Katie Cory, MD   No chief complaint on file.   HPI:      Katie Hays is a 42 y.o. [id number] whose LMP was No LMP recorded., presents today for NP eval of menorrhagia, referred by PCP  Hx of IDA with low ferritin levels 3/24; seeing hematology for Fe infusions  Patient Active Problem List   Diagnosis Date Noted   Iron deficiency anemia 12/16/2021   History of pneumonia 12/06/2020   Hyperglycemia 01/31/2020   Pericardial effusion 12/31/2019   Vitamin D deficiency 01/03/2019   Family history of heart disease in female family member before age 55 07/04/2016   MVP (mitral valve prolapse) 10/28/2015   Migraine headache 10/25/2015   Cervical high risk HPV (human papillomavirus) test positive 07/18/2015   H/O cesarean section 12/02/2012   History of blood transfusion 12/02/2012    Past Surgical History:  Procedure Laterality Date   CESAREAN SECTION  04/30/2016   04/16/2012   TUBAL LIGATION Bilateral 04/30/2016    Family History  Problem Relation Age of Onset   Clotting disorder Mother    Arthritis Mother    Anemia Mother    Irregular heart beat Mother    High Cholesterol Mother    Heart disease Father    Diabetes Father    Hypertension Father    Asthma Sister    Hypertension Sister    Diabetes Sister    Asthma Sister    Hypertension Sister    Heart attack Sister    Breast cancer Maternal Aunt    Breast cancer Cousin    Asthma Brother    Heart attack Brother     Social History   Socioeconomic History   Marital status: Married    Spouse name: Patrcia Dolly   Number of children: 2   Years of education: Not on file   Highest education level: Bachelor's degree (e.g., BA, AB, BS)  Occupational History   Occupation: real state appraisal  Tobacco Use   Smoking status: Never   Smokeless tobacco: Never  Vaping Use   Vaping status: Never Used  Substance and Sexual Activity   Alcohol use: Never    Comment: never   Drug use: No   Sexual activity: Yes     Partners: Male    Birth control/protection: Other-see comments    Comment: Tubal Ligation  Other Topics Concern   Not on file  Social History Narrative   Married and has two children, last one born in 2017    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   Social Determinants of Health   Financial Resource Strain: Low Risk  (01/26/2023)   Overall Financial Resource Strain (CARDIA)    Difficulty of Paying Living Expenses: Not hard at all  Food Insecurity: No Food Insecurity (01/26/2023)   Hunger Vital Sign    Worried About Running Out of Food in the Last Year: Never true    Ran Out of Food in the Last Year: Never true  Transportation Needs: No Transportation Needs (01/26/2023)   PRAPARE - Administrator, Civil Service (Medical): No    Lack of Transportation (Non-Medical): No  Physical Activity: Inactive (01/26/2023)   Exercise Vital Sign    Days of Exercise per Week: 0 days    Minutes of Exercise per Session: 0 min  Stress: Stress Concern Present (01/26/2023)   [employer] of Occupational Health - Occupational Stress Questionnaire    Feeling of Stress : Rather much  Social Connections: Socially  Integrated (01/26/2023)   Social Connection and Isolation Panel [NHANES]    Frequency of Communication with Friends and Family: More than three times a week    Frequency of Social Gatherings with Friends and Family: Twice a week    Attends Religious Services: 1 to 4 times per year    Active Member of [employer] or Organizations: Yes    Attends Engineer, structural: More than 4 times per year    Marital Status: Married  Catering manager Violence: Not At Risk (01/26/2023)   Humiliation, Afraid, Rape, and Kick questionnaire    Fear of Current or Ex-Partner: No    Emotionally Abused: No    Physically Abused: No    Sexually Abused: No    Outpatient Medications Prior to Visit  Medication Sig Dispense Refill   Cholecalciferol 25 MCG (1000 UT) tablet Take by mouth.     Multiple  Vitamins-Minerals (MULTIVITAMIN GUMMIES WOMENS PO) Take 2 each by mouth daily. VitaFusion Organic     trimethoprim-polymyxin b (POLYTRIM) ophthalmic solution Place 1 drop into both eyes every 4 (four) hours. 10 mL 0   No facility-administered medications prior to visit.      ROS:  Review of Systems BREAST: No symptoms   OBJECTIVE:   Vitals:  There were no vitals taken for this visit.  Physical Exam  Results: No results found for this or any previous visit (from the past 24 hour(s)).   Assessment/Plan: No diagnosis found.    No orders of the defined types were placed in this encounter.     No follow-ups on file.  Sherae Santino B. Cloa [place]ong, PA-C 06/08/2023 3:44 PM

## 2023-06-09 ENCOUNTER — Other Ambulatory Visit (HOSPITAL_COMMUNITY)
Admission: RE | Admit: 2023-06-09 | Discharge: 2023-06-09 | Disposition: A | Discharge status: Home / Self Care | Payer: 59 | Source: Ambulatory Visit | Attending: Obstetrics | Admitting: Obstetrics

## 2023-06-09 ENCOUNTER — Ambulatory Visit: Payer: 59 | Admitting: Obstetrics and Gynecology

## 2023-06-09 ENCOUNTER — Encounter: Payer: Self-pay | Admitting: Obstetrics and Gynecology

## 2023-06-09 VITALS — BP 98/70 | HR 81 | Ht 65.0 in | Wt 168.4 lb

## 2023-06-09 DIAGNOSIS — Z1151 Encounter for screening for human papillomavirus (HPV): Secondary | ICD-10-CM

## 2023-06-09 DIAGNOSIS — R87810 Cervical high risk human papillomavirus (HPV) DNA test positive: Secondary | ICD-10-CM | POA: Diagnosis not present

## 2023-06-09 DIAGNOSIS — N920 Excessive and frequent menstruation with regular cycle: Secondary | ICD-10-CM | POA: Diagnosis not present

## 2023-06-09 DIAGNOSIS — D509 Iron deficiency anemia, unspecified: Secondary | ICD-10-CM | POA: Diagnosis not present

## 2023-06-09 DIAGNOSIS — Z124 Encounter for screening for malignant neoplasm of cervix: Secondary | ICD-10-CM | POA: Insufficient documentation

## 2023-06-09 DIAGNOSIS — D500 Iron deficiency anemia secondary to blood loss (chronic): Secondary | ICD-10-CM

## 2023-06-09 MED ORDER — TRANEXAMIC ACID 650 MG PO TABS
1300.0000 mg | ORAL_TABLET | Freq: Three times a day (TID) | ORAL | 0 refills | Status: DC
Start: 2023-06-09 — End: 2023-07-31

## 2023-06-09 NOTE — Patient Instructions (Signed)
I value your feedback and you entrusting us with your care. If you get a [facility] patient survey, I would appreciate you taking the time to let us know about your experience today. Thank you! ? ? ?

## 2023-06-11 LAB — CYTOLOGY - PAP
Comment: NEGATIVE
Diagnosis: UNDETERMINED — AB
High risk HPV: NEGATIVE

## 2023-06-25 ENCOUNTER — Telehealth: Payer: Self-pay | Admitting: Obstetrics and Gynecology

## 2023-06-25 ENCOUNTER — Other Ambulatory Visit: Payer: 59

## 2023-06-25 NOTE — Telephone Encounter (Signed)
Reached out to pt to reschedule GYN [place] that was scheduled on 06/25/2023 at 2:00.  Was able to reschedule the pt for 07/14/2023 at 2:00.

## 2023-07-10 ENCOUNTER — Inpatient Hospital Stay: Payer: 59 | Attending: Oncology

## 2023-07-10 DIAGNOSIS — N920 Excessive and frequent menstruation with regular cycle: Secondary | ICD-10-CM | POA: Diagnosis not present

## 2023-07-10 DIAGNOSIS — D509 Iron deficiency anemia, unspecified: Secondary | ICD-10-CM | POA: Diagnosis not present

## 2023-07-10 DIAGNOSIS — D500 Iron deficiency anemia secondary to blood loss (chronic): Secondary | ICD-10-CM

## 2023-07-10 DIAGNOSIS — R5383 Other fatigue: Secondary | ICD-10-CM | POA: Diagnosis not present

## 2023-07-10 LAB — CBC WITH DIFFERENTIAL (CANCER CENTER ONLY)
Abs Immature Granulocytes: 0 10*3/uL (ref 0.00–0.07)
Basophils Absolute: 0 10*3/uL (ref 0.0–0.1)
Basophils Relative: 1 %
Eosinophils Absolute: 0.3 10*3/uL (ref 0.0–0.5)
Eosinophils Relative: 7 %
HCT: 36.9 % (ref 36.0–46.0)
Hemoglobin: 11.5 g/dL — ABNORMAL LOW (ref 12.0–15.0)
Immature Granulocytes: 0 %
Lymphocytes Relative: 49 %
Lymphs Abs: 1.7 10*3/uL (ref 0.7–4.0)
MCH: 26.7 pg (ref 26.0–34.0)
MCHC: 31.2 g/dL (ref 30.0–36.0)
MCV: 85.8 fL (ref 80.0–100.0)
Monocytes Absolute: 0.3 10*3/uL (ref 0.1–1.0)
Monocytes Relative: 8 %
Neutro Abs: 1.2 10*3/uL — ABNORMAL LOW (ref 1.7–7.7)
Neutrophils Relative %: 35 %
Platelet Count: 290 10*3/uL (ref 150–400)
RBC: 4.3 MIL/uL (ref 3.87–5.11)
RDW: 14.8 % (ref 11.5–15.5)
WBC Count: 3.5 10*3/uL — ABNORMAL LOW (ref 4.0–10.5)
nRBC: 0 % (ref 0.0–0.2)

## 2023-07-10 LAB — IRON AND TIBC
Iron: 94 ug/dL (ref 28–170)
Saturation Ratios: 23 % (ref 10.4–31.8)
TIBC: 402 ug/dL (ref 250–450)
UIBC: 308 ug/dL

## 2023-07-10 LAB — FERRITIN: Ferritin: 4 ng/mL — ABNORMAL LOW (ref 11–307)

## 2023-07-10 LAB — RETIC PANEL
Immature Retic Fract: 15.8 % (ref 2.3–15.9)
RBC.: 4.28 MIL/uL (ref 3.87–5.11)
Retic Count, Absolute: 58.1 10*3/uL (ref 19.0–186.0)
Retic Ct Pct: 1.4 % (ref 0.4–3.1)
Reticulocyte Hemoglobin: 29.5 pg (ref >27.9)

## 2023-07-13 ENCOUNTER — Encounter: Payer: Self-pay | Admitting: Oncology

## 2023-07-13 ENCOUNTER — Inpatient Hospital Stay: Payer: 59 | Admitting: Oncology

## 2023-07-13 ENCOUNTER — Inpatient Hospital Stay: Payer: 59

## 2023-07-13 VITALS — BP 98/65 | HR 66 | Temp 97.6°F | Resp 18 | Wt 170.6 lb

## 2023-07-13 VITALS — BP 98/59 | HR 58 | Resp 18

## 2023-07-13 DIAGNOSIS — R5383 Other fatigue: Secondary | ICD-10-CM | POA: Diagnosis not present

## 2023-07-13 DIAGNOSIS — D509 Iron deficiency anemia, unspecified: Secondary | ICD-10-CM | POA: Diagnosis not present

## 2023-07-13 DIAGNOSIS — N920 Excessive and frequent menstruation with regular cycle: Secondary | ICD-10-CM

## 2023-07-13 DIAGNOSIS — R7989 Other specified abnormal findings of blood chemistry: Secondary | ICD-10-CM | POA: Diagnosis not present

## 2023-07-13 DIAGNOSIS — E538 Deficiency of other specified B group vitamins: Secondary | ICD-10-CM | POA: Insufficient documentation

## 2023-07-13 MED ORDER — SODIUM CHLORIDE 0.9 % IV SOLN
INTRAVENOUS | Status: DC
  Filled 2023-07-13: qty 250

## 2023-07-13 MED ORDER — SODIUM CHLORIDE 0.9 % IV SOLN
200.0000 mg | Freq: Once | INTRAVENOUS | Status: DC
  Filled 2023-07-13: qty 10

## 2023-07-13 MED ORDER — SODIUM CHLORIDE 0.9 % IV SOLN
200.0000 mg | Freq: Once | INTRAVENOUS | Status: AC
  Administered 2023-07-13: 200 mg via INTRAVENOUS
  Filled 2023-07-13: qty 200

## 2023-07-13 NOTE — Progress Notes (Signed)
Hematology/Oncology Progress note [phone] [phone]   CHIEF COMPLAINTS/REASON FOR VISIT:  iron deficiency anemia.  ASSESSMENT & PLAN:   Iron deficiency anemia Labs are reviewed and discussed with patient. Lab Results  Component Value Date   TIBC 402 07/10/2023   IRONPCTSAT 23 07/10/2023   FERRITIN 4 (L) 07/10/2023   HGB 11.5 (L) 07/10/2023   Lab results are consistent with iron deficiency anemia.  Recommend IV Venfoer weekly x 4   Menorrhagia Recommend patient to follow-up with gynecology for management.  Orders Placed This Encounter  Procedures   CBC with Differential (Cancer Center Only)    Standing Status:   Future    Standing Expiration Date:   07/12/2024   Iron and TIBC    Standing Status:   Future    Standing Expiration Date:   07/12/2024   Ferritin    Standing Status:   Future    Standing Expiration Date:   07/12/2024   Retic Panel    Standing Status:   Future    Standing Expiration Date:   07/12/2024   RTC in 4 months.   All questions were answered. The patient knows to call the clinic with any problems, questions or concerns.  Rickard Patience, MD, PhD [facility] Hematology Oncology 07/13/2023   HISTORY OF PRESENTING ILLNESS:   Katie Hays is a  42 y.o.  female with PMH listed below was seen in consultation at the request of  Alba Cory, MD  for evaluation of iron deficiency anemia  12/10/2021, CBC showed hemoglobin 9.8, MCV 80.6.  Platelet 332,000, white count of 3.1.  Normal differential. Iron panel showed ferritin of 3, iron saturation 9, TIBC 401. Reviewed patient's previous blood work, patient seems to have chronic iron deficiency anemia.  With ferritin chronically at the level less than 10.  Normal folate and vitamin B12 level. Patient reports that she has tried oral iron supplementation not able to remember taking it due to busy work schedules.  She works as a Engineer, manufacturing systems.  Patient reports feeling  tired and fatigued.  She has heavy menstrual periods.  History of tubal ligation.  She has an appointment with gynecology in the upcoming weeks. Patient has history of meningitis.  Also prior history of pericardial effusion.  Patient reports that she follows up with cardiology.  She was last seen by her cardiology Dr. Laural Benes in February 2021. Denies black tarry stool or bloody stool.  Denies family history of colon cancer.  INTERVAL HISTORY Katie Hays is a 42 y.o. female who has above history reviewed by me today presents for follow up visit for iron deficiency anemia.  previously tolerated IV Venofer treatments.  She feels more tired recently. + menorrhagia.    Review of Systems  Constitutional:  Positive for fatigue. Negative for appetite change, chills and fever.  HENT:   Negative for hearing loss and voice change.   Eyes:  Negative for eye problems.  Respiratory:  Negative for chest tightness and cough.   Cardiovascular:  Negative for chest pain.  Gastrointestinal:  Negative for abdominal distention, abdominal pain and blood in stool.  Endocrine: Negative for hot flashes.  Genitourinary:  Positive for menstrual problem. Negative for difficulty urinating and frequency.   Musculoskeletal:  Negative for arthralgias.  Skin:  Negative for itching and rash.  Neurological:  Negative for extremity weakness.  Hematological:  Negative for adenopathy.  Psychiatric/Behavioral:  Negative for confusion.     MEDICAL HISTORY:  Past Medical History:  Diagnosis Date   Anemia    Chronic nonintractable headache    History of meningitis    History of pneumonia    Hyperglycemia    Pericardial effusion    Vitamin D deficiency     SURGICAL HISTORY: Past Surgical History:  Procedure Laterality Date   CESAREAN SECTION  04/30/2016   04/16/2012   TUBAL LIGATION Bilateral 04/30/2016    SOCIAL HISTORY: Social History   Socioeconomic History   Marital status: Married    Spouse name:  Moses   Number of children: 2   Years of education: Not on file   Highest education level: Bachelor's degree (e.g., BA, AB, BS)  Occupational History   Occupation: real state appraisal  Tobacco Use   Smoking status: Never   Smokeless tobacco: Never  Vaping Use   Vaping status: Never Used  Substance and Sexual Activity   Alcohol use: Never    Comment: never   Drug use: No   Sexual activity: Yes    Partners: Male    Birth control/protection: Other-see comments    Comment: Tubal Ligation  Other Topics Concern   Not on file  Social History Narrative   Married and has two children, last one born in 2017    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   Social Determinants of Health   Financial Resource Strain: Low Risk  (01/26/2023)   Overall Financial Resource Strain (CARDIA)    Difficulty of Paying Living Expenses: Not hard at all  Food Insecurity: No Food Insecurity (01/26/2023)   Hunger Vital Sign    Worried About Running Out of Food in the Last Year: Never true    Ran Out of Food in the Last Year: Never true  Transportation Needs: No Transportation Needs (01/26/2023)   PRAPARE - Administrator, Civil Service (Medical): No    Lack of Transportation (Non-Medical): No  Physical Activity: Inactive (01/26/2023)   Exercise Vital Sign    Days of Exercise per Week: 0 days    Minutes of Exercise per Session: 0 min  Stress: Stress Concern Present (01/26/2023)   [employer] of Occupational Health - Occupational Stress Questionnaire    Feeling of Stress : Rather much  Social Connections: Socially Integrated (01/26/2023)   Social Connection and Isolation Panel [NHANES]    Frequency of Communication with Friends and Family: More than three times a week    Frequency of Social Gatherings with Friends and Family: Twice a week    Attends Religious Services: 1 to 4 times per year    Active Member of [employer] or Organizations: Yes    Attends Engineer, structural: More than 4  times per year    Marital Status: Married  Catering manager Violence: Not At Risk (01/26/2023)   Humiliation, Afraid, Rape, and Kick questionnaire    Fear of Current or Ex-Partner: No    Emotionally Abused: No    Physically Abused: No    Sexually Abused: No    FAMILY HISTORY: Family History  Problem Relation Age of Onset   Clotting disorder Mother    Arthritis Mother    Anemia Mother    Irregular heart beat Mother    High Cholesterol Mother    Heart disease Father    Diabetes Father    Hypertension Father    Asthma Sister    Hypertension Sister    Diabetes Sister    Asthma Sister    Hypertension Sister    Heart attack Sister  Breast cancer Maternal Aunt    Breast cancer Cousin    Asthma Brother    Heart attack Brother     ALLERGIES:  is allergic to dilaudid [hydromorphone hcl].  MEDICATIONS:  Current Outpatient Medications  Medication Sig Dispense Refill   Cholecalciferol 25 MCG (1000 UT) tablet Take by mouth. (Patient not taking: Reported on 07/13/2023)     Multiple Vitamins-Minerals (MULTIVITAMIN GUMMIES WOMENS PO) Take 2 each by mouth daily. VitaFusion Organic (Patient not taking: Reported on 07/13/2023)     tranexamic acid (LYSTEDA) 650 MG TABS tablet Take 2 tablets (1,300 mg total) by mouth 3 (three) times daily. Take during menses for a maximum of five days (Patient not taking: Reported on 07/13/2023) 30 tablet 0   trimethoprim-polymyxin b (POLYTRIM) ophthalmic solution Place 1 drop into both eyes every 4 (four) hours. (Patient not taking: Reported on 07/13/2023) 10 mL 0   No current facility-administered medications for this visit.     PHYSICAL EXAMINATION: ECOG PERFORMANCE STATUS: 1 - Symptomatic but completely ambulatory Vitals:   07/13/23 1310  BP: 98/65  Pulse: 66  Resp: 18  Temp: 97.6 F (36.4 C)   Filed Weights   07/13/23 1310  Weight: 170 lb 9.6 oz (77.4 kg)    Physical Exam Constitutional:      General: She is not in acute distress. HENT:      Head: Normocephalic and atraumatic.  Eyes:     General: No scleral icterus. Cardiovascular:     Rate and Rhythm: Normal rate and regular rhythm.     Heart sounds: Normal heart sounds.  Pulmonary:     Effort: Pulmonary effort is normal. No respiratory distress.     Breath sounds: No wheezing.  Abdominal:     General: Bowel sounds are normal. There is no distension.     Palpations: Abdomen is soft.  Musculoskeletal:        General: No deformity. Normal range of motion.     Cervical back: Normal range of motion and neck supple.  Skin:    General: Skin is warm and dry.     Findings: No erythema or rash.  Neurological:     Mental Status: She is alert and oriented to person, place, and time. Mental status is at baseline.     Cranial Nerves: No cranial nerve deficit.     Coordination: Coordination normal.  Psychiatric:        Mood and Affect: Mood normal.     LABORATORY DATA:  I have reviewed the data as listed Lab Results  Component Value Date   WBC 3.5 (L) 07/10/2023   HGB 11.5 (L) 07/10/2023   HCT 36.9 07/10/2023   MCV 85.8 07/10/2023   PLT 290 07/10/2023   Recent Labs    01/26/23 1025  NA 141  K 4.3  CL 108  CO2 23  GLUCOSE 85  BUN 9  CREATININE 0.81  CALCIUM 9.5  PROT 7.3  AST 15  ALT 15  BILITOT 0.4   Iron/TIBC/Ferritin/ %Sat    Component Value Date/Time   IRON 94 07/10/2023 1042   TIBC 402 07/10/2023 1042   FERRITIN 4 (L) 07/10/2023 1042   IRONPCTSAT 23 07/10/2023 1042   IRONPCTSAT 19 01/26/2023 1025       RADIOGRAPHIC STUDIES: I have personally reviewed the radiological images as listed and agreed with the findings in the report. No results found.

## 2023-07-13 NOTE — Assessment & Plan Note (Signed)
Discussed with patient that her previous vitamin B12 level is at the low normal end. Patient has fatigue.  I recommend patient to empirically take vitamin B12 1000 mcg daily for 3 months.  We will recheck B12 level at the next visit.

## 2023-07-13 NOTE — Assessment & Plan Note (Signed)
Labs are reviewed and discussed with patient. Lab Results  Component Value Date   TIBC 402 07/10/2023   IRONPCTSAT 23 07/10/2023   FERRITIN 4 (L) 07/10/2023   HGB 11.5 (L) 07/10/2023   Lab results are consistent with iron deficiency anemia.  Recommend IV Venfoer weekly x 4

## 2023-07-13 NOTE — Assessment & Plan Note (Signed)
Recommend patient to follow-up with gynecology for management.

## 2023-07-14 ENCOUNTER — Telehealth: Payer: Self-pay | Admitting: Obstetrics and Gynecology

## 2023-07-14 ENCOUNTER — Ambulatory Visit (INDEPENDENT_AMBULATORY_CARE_PROVIDER_SITE_OTHER): Payer: 59

## 2023-07-14 DIAGNOSIS — D500 Iron deficiency anemia secondary to blood loss (chronic): Secondary | ICD-10-CM | POA: Diagnosis not present

## 2023-07-14 DIAGNOSIS — N920 Excessive and frequent menstruation with regular cycle: Secondary | ICD-10-CM

## 2023-07-14 NOTE — Telephone Encounter (Signed)
Pt aware of Gyn u/s results and small leio. Lysteda not helping with menorrhagia this menses so not good tx option going forward. Tx options of hormones, IUD, ablation, hyst discussed. Pt not interested in hormones at appt. Pt to consider tx options and f/u. If wants ablation, I would check with MD to see if candidate due to leio position.

## 2023-07-20 ENCOUNTER — Inpatient Hospital Stay: Payer: 59

## 2023-07-24 DIAGNOSIS — L630 Alopecia (capitis) totalis: Secondary | ICD-10-CM | POA: Diagnosis not present

## 2023-07-28 ENCOUNTER — Inpatient Hospital Stay: Payer: 59 | Attending: Oncology

## 2023-07-28 VITALS — BP 103/47 | HR 64 | Temp 97.5°F | Resp 18

## 2023-07-28 DIAGNOSIS — D509 Iron deficiency anemia, unspecified: Secondary | ICD-10-CM | POA: Insufficient documentation

## 2023-07-28 DIAGNOSIS — Z79899 Other long term (current) drug therapy: Secondary | ICD-10-CM | POA: Insufficient documentation

## 2023-07-28 MED ORDER — SODIUM CHLORIDE 0.9 % IV SOLN
200.0000 mg | Freq: Once | INTRAVENOUS | Status: AC
  Administered 2023-07-28: 200 mg via INTRAVENOUS
  Filled 2023-07-28: qty 200

## 2023-07-28 MED ORDER — SODIUM CHLORIDE 0.9 % IV SOLN
Freq: Once | INTRAVENOUS | Status: AC
  Filled 2023-07-28: qty 250

## 2023-07-30 NOTE — Progress Notes (Addendum)
Name: Katie Hays   MRN: [id number]    DOB: Sep 26, 1981   Date:07/31/2023       Progress Note  Subjective  Chief Complaint  Follow Up  HPI  Iron deficiency anemia: she has a long history of anemia, she tried oral ferrous sulfate but is currently going to hematologist and having iron infusion weekly since August and will have repeat labs in a few weeks. Cycles are 5 days long and heavy for about 3 days She was seen by Gyn and tried [place], she was given options hormones, IUD, ablation and  hysterectomy   No family history of colon cancer. She has fatigue, having mild symptoms of pica   Hyperglycemia: denies polyphagia, polydipsia or polyuria, A1C was up from  5.7 % to 5.9 %  a few years ago, but normal since     Vitamin D deficiency: she needs to resume otc supplementation  AR: she is has nasal congestion, rhinorrhea, post-nasal drainage and mild headache. She has seen ENT. She tried nasal spray, she is also on singulair. She also saw allergist but all allergy testing was negative. Symptoms unchanged She would like to go see another allergist for a second opinion in [place]    History of pneumonia: admitted to [facility] 12/2019 , she had hypoxia, not due to COVID-19. Stayed for 2 days, she had pneumonia vaccine last year, discussed flu shot today    Pericarditis: chronic since 2016, had echo done during admission for pneumonia in 2003 or 2004 ( per patient), she sees Dr. Juliann Pares, no chest pain or palpitation, denies orthopnea.   Insomnia: she has difficulty falling asleep and wakes up during the night and cannot stop her mind , has to take otc supplements   Patient Active Problem List   Diagnosis Date Noted   Primary insomnia 07/31/2023   Menorrhagia 07/13/2023   Low serum vitamin B12 07/13/2023   Iron deficiency anemia 12/16/2021   History of pneumonia 12/06/2020   Hyperglycemia 01/31/2020   Pericardial effusion 12/31/2019   Vitamin D deficiency 01/03/2019   Family history of  heart disease in female family member before age 54 07/04/2016   MVP (mitral valve prolapse) 10/28/2015   Migraine headache 10/25/2015   Cervical high risk HPV (human papillomavirus) test positive 07/18/2015   H/O cesarean section 12/02/2012   History of blood transfusion 12/02/2012    Past Surgical History:  Procedure Laterality Date   CESAREAN SECTION  04/30/2016   04/16/2012   TUBAL LIGATION Bilateral 04/30/2016    Family History  Problem Relation Age of Onset   Clotting disorder Mother    Arthritis Mother    Anemia Mother    Irregular heart beat Mother    High Cholesterol Mother    Heart disease Father    Diabetes Father    Hypertension Father    Asthma Sister    Hypertension Sister    Diabetes Sister    Asthma Sister    Hypertension Sister    Heart attack Sister    Breast cancer Maternal Aunt    Breast cancer Cousin    Asthma Brother    Heart attack Brother     Social History   Tobacco Use   Smoking status: Never   Smokeless tobacco: Never  Substance Use Topics   Alcohol use: Never    Comment: never     Current Outpatient Medications:    traZODone (DESYREL) 50 MG tablet, Take 0.5-1 tablets (25-50 mg total) by mouth at bedtime as needed for  sleep., Disp: 30 tablet, Rfl: 0   Cholecalciferol 25 MCG (1000 UT) tablet, Take by mouth. (Patient not taking: Reported on 07/13/2023), Disp: , Rfl:    Multiple Vitamins-Minerals (MULTIVITAMIN GUMMIES WOMENS PO), Take 2 each by mouth daily. VitaFusion Organic (Patient not taking: Reported on 07/13/2023), Disp: , Rfl:    tranexamic acid (LYSTEDA) 650 MG TABS tablet, Take 2 tablets (1,300 mg total) by mouth 3 (three) times daily. Take during menses for a maximum of five days (Patient not taking: Reported on 07/13/2023), Disp: 30 tablet, Rfl: 0   trimethoprim-polymyxin b (POLYTRIM) ophthalmic solution, Place 1 drop into both eyes every 4 (four) hours. (Patient not taking: Reported on 07/13/2023), Disp: 10 mL, Rfl: 0  Allergies   Allergen Reactions   Dilaudid [Hydromorphone Hcl] Swelling    I personally reviewed active problem list, medication list, allergies, family history, social history, health maintenance with the patient/caregiver today.   ROS  Ten systems reviewed and is negative except as mentioned in HPI    Objective  Vitals:   07/31/23 0905  BP: 110/66  Pulse: 77  Resp: 14  Temp: 97.9 F (36.6 C)  TempSrc: Oral  SpO2: 96%  Weight: 172 lb 6.4 oz (78.2 kg)  Height: 5\' 5"  (1.651 m)    Body mass index is 28.69 kg/m.  Physical Exam  Constitutional: Patient appears well-developed and well-nourished. Obese  No distress.  HEENT: head atraumatic, normocephalic, pupils equal and reactive to light, neck supple Cardiovascular: Normal rate, regular rhythm and normal heart sounds.  No murmur heard. No BLE edema. Pulmonary/Chest: Effort normal and breath sounds normal. No respiratory distress. Abdominal: Soft.  There is no tenderness. Psychiatric: Patient has a normal mood and affect. behavior is normal. Judgment and thought content normal.    PHQ2/9:    07/31/2023    9:23 AM 04/02/2023    8:23 AM 01/26/2023    9:20 AM 12/10/2021    9:05 AM 12/06/2020    9:31 AM  Depression screen PHQ 2/9  Decreased Interest 0 0 0 0 0  Down, Depressed, Hopeless 0 0 0 0 0  PHQ - 2 Score 0 0 0 0 0  Altered sleeping 0 0 0 3 3  Tired, decreased energy 0 0 0 3 2  Change in appetite 0 0 0 3 0  Feeling bad or failure about yourself  0 0 0 0 0  Trouble concentrating 0 0 0 0 0  Moving slowly or fidgety/restless 0 0 0 0 0  Suicidal thoughts 0 0 0 0 0  PHQ-9 Score 0 0 0 9 5  Difficult doing work/chores  Not difficult at all Not difficult at all  Not difficult at all    phq 9 is negative   Fall Risk:    07/31/2023    9:23 AM 04/02/2023    8:23 AM 01/26/2023    9:20 AM 12/10/2021    9:05 AM 12/06/2020    9:32 AM  Fall Risk   Falls in the past year? 0 0 0 0 0  Number falls in past yr:  0 0 0 0  Injury with Fall?  0 0  0 0  Risk for fall due to : No Fall Risks No Fall Risks No Fall Risks No Fall Risks   Follow up Falls prevention discussed Falls prevention discussed;Education provided;Falls evaluation completed Falls prevention discussed;Education provided;Falls evaluation completed Falls prevention discussed      Assessment & Plan  1. Menorrhagia with regular cycle  She will go  back to gyn, explained importance of stopping bleeding   2. Hyperglycemia  A1C  3. Iron deficiency anemia due to chronic blood loss  Getting iron infusion  4. Low serum vitamin B12  Under the care of hematologist   5. Vitamin D deficiency  - VITAMIN D 25 Hydroxy (Vit-D Deficiency, Fractures)  6. Other fatigue  - COMPLETE METABOLIC PANEL WITH GFR - TSH + free T4  7. Primary insomnia  - traZODone (DESYREL) 50 MG tablet; Take 0.5-1 tablets (25-50 mg total) by mouth at bedtime as needed for sleep.  Dispense: 30 tablet; Refill: 0   Lack os sleep may be the cause of her fatigue   8. Need for immunization against influenza  - Flu Vaccine QUAD 6+ mos PF IM (Fluarix Quad PF)

## 2023-07-31 ENCOUNTER — Encounter: Payer: Self-pay | Admitting: Family Medicine

## 2023-07-31 ENCOUNTER — Ambulatory Visit (INDEPENDENT_AMBULATORY_CARE_PROVIDER_SITE_OTHER): Payer: 59 | Admitting: Family Medicine

## 2023-07-31 VITALS — BP 110/66 | HR 77 | Temp 97.9°F | Resp 14 | Ht 65.0 in | Wt 172.4 lb

## 2023-07-31 DIAGNOSIS — R739 Hyperglycemia, unspecified: Secondary | ICD-10-CM | POA: Diagnosis not present

## 2023-07-31 DIAGNOSIS — E559 Vitamin D deficiency, unspecified: Secondary | ICD-10-CM | POA: Diagnosis not present

## 2023-07-31 DIAGNOSIS — F5101 Primary insomnia: Secondary | ICD-10-CM | POA: Diagnosis not present

## 2023-07-31 DIAGNOSIS — D500 Iron deficiency anemia secondary to blood loss (chronic): Secondary | ICD-10-CM | POA: Diagnosis not present

## 2023-07-31 DIAGNOSIS — E538 Deficiency of other specified B group vitamins: Secondary | ICD-10-CM

## 2023-07-31 DIAGNOSIS — R5383 Other fatigue: Secondary | ICD-10-CM | POA: Diagnosis not present

## 2023-07-31 DIAGNOSIS — Z23 Encounter for immunization: Secondary | ICD-10-CM | POA: Diagnosis not present

## 2023-07-31 DIAGNOSIS — N920 Excessive and frequent menstruation with regular cycle: Secondary | ICD-10-CM | POA: Diagnosis not present

## 2023-07-31 MED ORDER — TRAZODONE HCL 50 MG PO TABS
25.0000 mg | ORAL_TABLET | Freq: Every evening | ORAL | 0 refills | Status: DC | PRN
Start: 2023-07-31 — End: 2024-05-04

## 2023-07-31 NOTE — Addendum Note (Signed)
Addended by: Dollene Primrose on: 07/31/2023 09:58 AM   Modules accepted: Orders

## 2023-07-31 NOTE — Patient Instructions (Signed)
May you be happy ?May you be healthy ?May you be safe  ?

## 2023-08-01 LAB — COMPLETE METABOLIC PANEL WITH GFR
AG Ratio: 1.3 (calc) (ref 1.0–2.5)
ALT: 17 U/L (ref 6–29)
AST: 16 U/L (ref 10–30)
Albumin: 4.1 g/dL (ref 3.6–5.1)
Alkaline phosphatase (APISO): 60 U/L (ref 31–125)
BUN: 9 mg/dL (ref 7–25)
CO2: 28 mmol/L (ref 20–32)
Calcium: 9.8 mg/dL (ref 8.6–10.2)
Chloride: 107 mmol/L (ref 98–110)
Creat: 0.79 mg/dL (ref 0.50–0.99)
Globulin: 3.2 g/dL (ref 1.9–3.7)
Glucose, Bld: 88 mg/dL (ref 65–99)
Potassium: 5.1 mmol/L (ref 3.5–5.3)
Sodium: 140 mmol/L (ref 135–146)
Total Bilirubin: 0.4 mg/dL (ref 0.2–1.2)
Total Protein: 7.3 g/dL (ref 6.1–8.1)
eGFR: 95 mL/min/{1.73_m2} (ref >=60)

## 2023-08-01 LAB — TSH+FREE T4: TSH W/REFLEX TO FT4: 0.76 m[IU]/L

## 2023-08-01 LAB — VITAMIN D 25 HYDROXY (VIT D DEFICIENCY, FRACTURES): Vit D, 25-Hydroxy: 18 ng/mL — ABNORMAL LOW (ref 30–100)

## 2023-08-02 ENCOUNTER — Other Ambulatory Visit: Payer: Self-pay | Admitting: Family Medicine

## 2023-08-02 MED ORDER — VITAMIN D (ERGOCALCIFEROL) 1.25 MG (50000 UNIT) PO CAPS: 50000.0000 [IU] | ORAL_CAPSULE | ORAL | 0 refills | Status: DC

## 2023-08-03 ENCOUNTER — Inpatient Hospital Stay: Payer: 59

## 2023-08-03 VITALS — BP 99/56 | HR 66 | Temp 98.7°F | Resp 14

## 2023-08-03 DIAGNOSIS — D509 Iron deficiency anemia, unspecified: Secondary | ICD-10-CM | POA: Diagnosis not present

## 2023-08-03 DIAGNOSIS — Z79899 Other long term (current) drug therapy: Secondary | ICD-10-CM | POA: Diagnosis not present

## 2023-08-03 MED ORDER — SODIUM CHLORIDE 0.9 % IV SOLN
Freq: Once | INTRAVENOUS | Status: AC
  Filled 2023-08-03: qty 250

## 2023-08-03 MED ORDER — SODIUM CHLORIDE 0.9 % IV SOLN
200.0000 mg | Freq: Once | INTRAVENOUS | Status: AC
  Administered 2023-08-03: 200 mg via INTRAVENOUS
  Filled 2023-08-03: qty 200

## 2023-08-03 NOTE — Progress Notes (Signed)
Pt declined to wait the 30 minute post iron infusion observation period.  Pt tolerated infusion well and left the infusion suite stable and ambulatory.

## 2023-08-03 NOTE — Patient Instructions (Signed)
Iron Sucrose Injection What is this medication? IRON SUCROSE (EYE ern SOO krose) treats low levels of iron (iron deficiency anemia) in people with kidney disease. Iron is a mineral that plays an important role in making red blood cells, which carry oxygen from your lungs to the rest of your body. This medicine may be used for other purposes; ask your health care provider or pharmacist if you have questions. COMMON BRAND NAME(S): Venofer What should I tell my care team before I take this medication? They need to know if you have any of these conditions: Anemia not caused by low iron levels Heart disease High levels of iron in the blood Kidney disease Liver disease An unusual or allergic reaction to iron, other medications, foods, dyes, or preservatives Pregnant or trying to get pregnant Breastfeeding How should I use this medication? This medication is for infusion into a vein. It is given in a hospital or clinic setting. Talk to your care team about the use of this medication in children. While this medication may be prescribed for children as young as 2 years for selected conditions, precautions do apply. Overdosage: If you think you have taken too much of this medicine contact a poison control center or emergency room at once. NOTE: This medicine is only for you. Do not share this medicine with others. What if I miss a dose? Keep appointments for follow-up doses. It is important not to miss your dose. Call your care team if you are unable to keep an appointment. What may interact with this medication? Do not take this medication with any of the following: Deferoxamine Dimercaprol Other iron products This medication may also interact with the following: Chloramphenicol Deferasirox This list may not describe all possible interactions. Give your health care provider a list of all the medicines, herbs, non-prescription drugs, or dietary supplements you use. Also tell them if you smoke,  drink alcohol, or use illegal drugs. Some items may interact with your medicine. What should I watch for while using this medication? Visit your care team regularly. Tell your care team if your symptoms do not start to get better or if they get worse. You may need blood work done while you are taking this medication. You may need to follow a special diet. Talk to your care team. Foods that contain iron include: whole grains/cereals, dried fruits, beans, or peas, leafy green vegetables, and organ meats (liver, kidney). What side effects may I notice from receiving this medication? Side effects that you should report to your care team as soon as possible: Allergic reactions--skin rash, itching, hives, swelling of the face, lips, tongue, or throat Low blood pressure--dizziness, feeling faint or lightheaded, blurry vision Shortness of breath Side effects that usually do not require medical attention (report to your care team if they continue or are bothersome): Flushing Headache Joint pain Muscle pain Nausea Pain, redness, or irritation at injection site This list may not describe all possible side effects. Call your doctor for medical advice about side effects. You may report side effects to FDA at 1-800-FDA-1088. Where should I keep my medication? This medication is given in a hospital or clinic. It will not be stored at home. NOTE: This sheet is a summary. It may not cover all possible information. If you have questions about this medicine, talk to your doctor, pharmacist, or health care provider.  2024 Elsevier/Gold Standard (2023-04-17 00:00:00)

## 2023-08-10 ENCOUNTER — Inpatient Hospital Stay: Payer: 59

## 2023-08-10 VITALS — BP 95/63 | HR 65 | Temp 97.3°F | Resp 16

## 2023-08-10 DIAGNOSIS — D509 Iron deficiency anemia, unspecified: Secondary | ICD-10-CM

## 2023-08-10 DIAGNOSIS — Z79899 Other long term (current) drug therapy: Secondary | ICD-10-CM | POA: Diagnosis not present

## 2023-08-10 MED ORDER — SODIUM CHLORIDE 0.9 % IV SOLN
INTRAVENOUS | Status: DC
  Filled 2023-08-10: qty 250

## 2023-08-10 MED ORDER — SODIUM CHLORIDE 0.9 % IV SOLN
200.0000 mg | Freq: Once | INTRAVENOUS | Status: AC
  Administered 2023-08-10: 200 mg via INTRAVENOUS
  Filled 2023-08-10: qty 200

## 2023-08-10 NOTE — Patient Instructions (Signed)
Iron Sucrose Injection What is this medication? IRON SUCROSE (EYE ern SOO krose) treats low levels of iron (iron deficiency anemia) in people with kidney disease. Iron is a mineral that plays an important role in making red blood cells, which carry oxygen from your lungs to the rest of your body. This medicine may be used for other purposes; ask your health care provider or pharmacist if you have questions. COMMON BRAND NAME(S): Venofer What should I tell my care team before I take this medication? They need to know if you have any of these conditions: Anemia not caused by low iron levels Heart disease High levels of iron in the blood Kidney disease Liver disease An unusual or allergic reaction to iron, other medications, foods, dyes, or preservatives Pregnant or trying to get pregnant Breastfeeding How should I use this medication? This medication is for infusion into a vein. It is given in a hospital or clinic setting. Talk to your care team about the use of this medication in children. While this medication may be prescribed for children as young as 2 years for selected conditions, precautions do apply. Overdosage: If you think you have taken too much of this medicine contact a poison control center or emergency room at once. NOTE: This medicine is only for you. Do not share this medicine with others. What if I miss a dose? Keep appointments for follow-up doses. It is important not to miss your dose. Call your care team if you are unable to keep an appointment. What may interact with this medication? Do not take this medication with any of the following: Deferoxamine Dimercaprol Other iron products This medication may also interact with the following: Chloramphenicol Deferasirox This list may not describe all possible interactions. Give your health care provider a list of all the medicines, herbs, non-prescription drugs, or dietary supplements you use. Also tell them if you smoke,  drink alcohol, or use illegal drugs. Some items may interact with your medicine. What should I watch for while using this medication? Visit your care team regularly. Tell your care team if your symptoms do not start to get better or if they get worse. You may need blood work done while you are taking this medication. You may need to follow a special diet. Talk to your care team. Foods that contain iron include: whole grains/cereals, dried fruits, beans, or peas, leafy green vegetables, and organ meats (liver, kidney). What side effects may I notice from receiving this medication? Side effects that you should report to your care team as soon as possible: Allergic reactions--skin rash, itching, hives, swelling of the face, lips, tongue, or throat Low blood pressure--dizziness, feeling faint or lightheaded, blurry vision Shortness of breath Side effects that usually do not require medical attention (report to your care team if they continue or are bothersome): Flushing Headache Joint pain Muscle pain Nausea Pain, redness, or irritation at injection site This list may not describe all possible side effects. Call your doctor for medical advice about side effects. You may report side effects to FDA at 1-800-FDA-1088. Where should I keep my medication? This medication is given in a hospital or clinic. It will not be stored at home. NOTE: This sheet is a summary. It may not cover all possible information. If you have questions about this medicine, talk to your doctor, pharmacist, or health care provider.  2024 Elsevier/Gold Standard (2023-04-17 00:00:00)

## 2023-08-17 ENCOUNTER — Inpatient Hospital Stay: Payer: 59

## 2023-08-25 ENCOUNTER — Other Ambulatory Visit: Payer: Self-pay | Admitting: Family Medicine

## 2023-08-25 DIAGNOSIS — F5101 Primary insomnia: Secondary | ICD-10-CM

## 2023-09-01 ENCOUNTER — Encounter: Payer: Self-pay | Admitting: Oncology

## 2023-11-06 ENCOUNTER — Inpatient Hospital Stay: Payer: 59 | Attending: Oncology

## 2023-11-06 DIAGNOSIS — E559 Vitamin D deficiency, unspecified: Secondary | ICD-10-CM | POA: Insufficient documentation

## 2023-11-06 DIAGNOSIS — E538 Deficiency of other specified B group vitamins: Secondary | ICD-10-CM | POA: Insufficient documentation

## 2023-11-06 DIAGNOSIS — Z79899 Other long term (current) drug therapy: Secondary | ICD-10-CM | POA: Insufficient documentation

## 2023-11-06 DIAGNOSIS — Z803 Family history of malignant neoplasm of breast: Secondary | ICD-10-CM | POA: Diagnosis not present

## 2023-11-06 DIAGNOSIS — N920 Excessive and frequent menstruation with regular cycle: Secondary | ICD-10-CM | POA: Insufficient documentation

## 2023-11-06 DIAGNOSIS — D509 Iron deficiency anemia, unspecified: Secondary | ICD-10-CM | POA: Insufficient documentation

## 2023-11-06 DIAGNOSIS — R5383 Other fatigue: Secondary | ICD-10-CM | POA: Diagnosis not present

## 2023-11-06 DIAGNOSIS — I3139 Other pericardial effusion (noninflammatory): Secondary | ICD-10-CM | POA: Insufficient documentation

## 2023-11-06 DIAGNOSIS — R7989 Other specified abnormal findings of blood chemistry: Secondary | ICD-10-CM

## 2023-11-06 DIAGNOSIS — Z9851 Tubal ligation status: Secondary | ICD-10-CM | POA: Diagnosis not present

## 2023-11-06 LAB — CBC WITH DIFFERENTIAL (CANCER CENTER ONLY)
Abs Immature Granulocytes: 0.04 10*3/uL (ref 0.00–0.07)
Basophils Absolute: 0 10*3/uL (ref 0.0–0.1)
Basophils Relative: 1 %
Eosinophils Absolute: 0.4 10*3/uL (ref 0.0–0.5)
Eosinophils Relative: 10 %
HCT: 38.3 % (ref 36.0–46.0)
Hemoglobin: 12.4 g/dL (ref 12.0–15.0)
Immature Granulocytes: 1 %
Lymphocytes Relative: 37 %
Lymphs Abs: 1.6 10*3/uL (ref 0.7–4.0)
MCH: 28.1 pg (ref 26.0–34.0)
MCHC: 32.4 g/dL (ref 30.0–36.0)
MCV: 86.8 fL (ref 80.0–100.0)
Monocytes Absolute: 0.4 10*3/uL (ref 0.1–1.0)
Monocytes Relative: 9 %
Neutro Abs: 1.8 10*3/uL (ref 1.7–7.7)
Neutrophils Relative %: 42 %
Platelet Count: 279 10*3/uL (ref 150–400)
RBC: 4.41 MIL/uL (ref 3.87–5.11)
RDW: 13.7 % (ref 11.5–15.5)
WBC Count: 4.3 10*3/uL (ref 4.0–10.5)
nRBC: 0 % (ref 0.0–0.2)

## 2023-11-06 LAB — RETIC PANEL
Immature Retic Fract: 10.5 % (ref 2.3–15.9)
RBC.: 4.38 MIL/uL (ref 3.87–5.11)
Retic Count, Absolute: 65.3 10*3/uL (ref 19.0–186.0)
Retic Ct Pct: 1.5 % (ref 0.4–3.1)
Reticulocyte Hemoglobin: 29.8 pg (ref >27.9)

## 2023-11-06 LAB — VITAMIN B12: Vitamin B-12: 201 pg/mL (ref 180–914)

## 2023-11-06 LAB — IRON AND TIBC
Iron: 96 ug/dL (ref 28–170)
Saturation Ratios: 28 % (ref 10.4–31.8)
TIBC: 344 ug/dL (ref 250–450)
UIBC: 248 ug/dL

## 2023-11-06 LAB — FERRITIN: Ferritin: 20 ng/mL (ref 11–307)

## 2023-11-09 ENCOUNTER — Encounter: Payer: Self-pay | Admitting: Oncology

## 2023-11-09 ENCOUNTER — Inpatient Hospital Stay: Payer: 59

## 2023-11-09 ENCOUNTER — Inpatient Hospital Stay (HOSPITAL_BASED_OUTPATIENT_CLINIC_OR_DEPARTMENT_OTHER): Payer: 59 | Admitting: Oncology

## 2023-11-09 VITALS — BP 112/49 | HR 62 | Temp 99.0°F | Resp 18

## 2023-11-09 VITALS — BP 96/53 | HR 73 | Temp 97.4°F | Resp 18 | Wt 170.0 lb

## 2023-11-09 DIAGNOSIS — E538 Deficiency of other specified B group vitamins: Secondary | ICD-10-CM | POA: Diagnosis not present

## 2023-11-09 DIAGNOSIS — D509 Iron deficiency anemia, unspecified: Secondary | ICD-10-CM

## 2023-11-09 DIAGNOSIS — N920 Excessive and frequent menstruation with regular cycle: Secondary | ICD-10-CM

## 2023-11-09 DIAGNOSIS — D500 Iron deficiency anemia secondary to blood loss (chronic): Secondary | ICD-10-CM | POA: Diagnosis not present

## 2023-11-09 MED ORDER — CYANOCOBALAMIN 1000 MCG/ML IJ SOLN
1000.0000 ug | Freq: Once | INTRAMUSCULAR | Status: AC
  Administered 2023-11-09: 1000 ug via INTRAMUSCULAR
  Filled 2023-11-09: qty 1

## 2023-11-09 MED ORDER — SODIUM CHLORIDE 0.9% FLUSH
10.0000 mL | Freq: Once | INTRAVENOUS | Status: AC | PRN
  Administered 2023-11-09: 10 mL
  Filled 2023-11-09: qty 10

## 2023-11-09 MED ORDER — IRON SUCROSE 20 MG/ML IV SOLN
200.0000 mg | Freq: Once | INTRAVENOUS | Status: AC
  Administered 2023-11-09: 200 mg via INTRAVENOUS
  Filled 2023-11-09: qty 10

## 2023-11-09 NOTE — Progress Notes (Signed)
Hematology/Oncology Progress note [phone] [phone]) [phone]   CHIEF COMPLAINTS/REASON FOR VISIT:  iron deficiency anemia.  ASSESSMENT & PLAN:   Iron deficiency anemia Labs are reviewed and discussed with patient. Lab Results  Component Value Date   HGB 12.4 11/06/2023   TIBC 344 11/06/2023   IRONPCTSAT 28 11/06/2023   FERRITIN 20 11/06/2023    Improved hemoglobin and iron panel Recommend IV Venfoer x1 to further improve her iron store giving that her menstrual period is still quite heavy, although slight better than previous    Low serum vitamin B12 She is not taking oral B12 supplementation consistently.  Recommend B12 weekly x 4 followed by monthly injections.  Repeat B12 at next visit.   Menorrhagia Recommend patient to follow-up with gynecology for management.  Orders Placed This Encounter  Procedures   CBC with Differential (Cancer Center Only)    Standing Status:   Future    Expected Date:   02/07/2024    Expiration Date:   11/08/2024   Ferritin    Standing Status:   Future    Expected Date:   02/07/2024    Expiration Date:   11/08/2024   Iron and TIBC    Standing Status:   Future    Expected Date:   02/07/2024    Expiration Date:   11/08/2024   Retic Panel    Standing Status:   Future    Expected Date:   02/07/2024    Expiration Date:   11/08/2024   Vitamin B12    Standing Status:   Future    Expected Date:   02/07/2024    Expiration Date:   11/08/2024   RTC in 4 months.   All questions were answered. The patient knows to call the clinic with any problems, questions or concerns.  Rickard Patience, MD, PhD [facility] Hematology Oncology 11/09/2023   HISTORY OF PRESENTING ILLNESS:   Katie Hays is a  42 y.o.  female with PMH listed below was seen in consultation at the request of  Alba Cory, MD  for evaluation of iron deficiency anemia  12/10/2021, CBC showed hemoglobin 9.8, MCV 80.6.  Platelet 332,000, white count of 3.1.   Normal differential. Iron panel showed ferritin of 3, iron saturation 9, TIBC 401. Reviewed patient's previous blood work, patient seems to have chronic iron deficiency anemia.  With ferritin chronically at the level less than 10.  Normal folate and vitamin B12 level. Patient reports that she has tried oral iron supplementation not able to remember taking it due to busy work schedules.  She works as a Engineer, manufacturing systems.  Patient reports feeling tired and fatigued.  She has heavy menstrual periods.  History of tubal ligation.  She has an appointment with gynecology in the upcoming weeks. Patient has history of meningitis.  Also prior history of pericardial effusion.  Patient reports that she follows up with cardiology.  She was last seen by her cardiology Dr. Laural Benes in February 2021. Denies black tarry stool or bloody stool.  Denies family history of colon cancer.  INTERVAL HISTORY Katie Hays is a 42 y.o. female who has above history reviewed by me today presents for follow up visit for iron deficiency anemia.  previously tolerated IV Venofer treatments.  Chronic fatigue, same, not worse. . + menorrhagia.    Review of Systems  Constitutional:  Positive for fatigue. Negative for appetite change, chills and fever.  HENT:   Negative for hearing loss and voice  change.   Eyes:  Negative for eye problems.  Respiratory:  Negative for chest tightness and cough.   Cardiovascular:  Negative for chest pain.  Gastrointestinal:  Negative for abdominal distention, abdominal pain and blood in stool.  Endocrine: Negative for hot flashes.  Genitourinary:  Positive for menstrual problem. Negative for difficulty urinating and frequency.   Musculoskeletal:  Negative for arthralgias.  Skin:  Negative for itching and rash.  Neurological:  Negative for extremity weakness.  Hematological:  Negative for adenopathy.  Psychiatric/Behavioral:  Negative for confusion.     MEDICAL HISTORY:   Past Medical History:  Diagnosis Date   Anemia    Chronic nonintractable headache    History of meningitis    History of pneumonia    Hyperglycemia    Pericardial effusion    Vitamin D deficiency     SURGICAL HISTORY: Past Surgical History:  Procedure Laterality Date   CESAREAN SECTION  04/30/2016   04/16/2012   TUBAL LIGATION Bilateral 04/30/2016    SOCIAL HISTORY: Social History   Socioeconomic History   Marital status: Married    Spouse name: Moses   Number of children: 2   Years of education: Not on file   Highest education level: Bachelor's degree (e.g., BA, AB, BS)  Occupational History   Occupation: real state appraisal  Tobacco Use   Smoking status: Never   Smokeless tobacco: Never  Vaping Use   Vaping status: Never Used  Substance and Sexual Activity   Alcohol use: Never    Comment: never   Drug use: No   Sexual activity: Yes    Partners: Male    Birth control/protection: Other-see comments    Comment: Tubal Ligation  Other Topics Concern   Not on file  Social History Narrative   Married and has two children, last one born in 2017    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   Social Drivers of Health   Financial Resource Strain: Low Risk  (01/26/2023)   Overall Financial Resource Strain (CARDIA)    Difficulty of Paying Living Expenses: Not hard at all  Food Insecurity: No Food Insecurity (01/26/2023)   Hunger Vital Sign    Worried About Running Out of Food in the Last Year: Never true    Ran Out of Food in the Last Year: Never true  Transportation Needs: No Transportation Needs (01/26/2023)   PRAPARE - Administrator, Civil Service (Medical): No    Lack of Transportation (Non-Medical): No  Physical Activity: Inactive (01/26/2023)   Exercise Vital Sign    Days of Exercise per Week: 0 days    Minutes of Exercise per Session: 0 min  Stress: Stress Concern Present (01/26/2023)   [employer] of Occupational Health - Occupational Stress  Questionnaire    Feeling of Stress : Rather much  Social Connections: Socially Integrated (01/26/2023)   Social Connection and Isolation Panel [NHANES]    Frequency of Communication with Friends and Family: More than three times a week    Frequency of Social Gatherings with Friends and Family: Twice a week    Attends Religious Services: 1 to 4 times per year    Active Member of [employer] or Organizations: Yes    Attends Banker Meetings: More than 4 times per year    Marital Status: Married  Catering manager Violence: Not At Risk (01/26/2023)   Humiliation, Afraid, Rape, and Kick questionnaire    Fear of Current or Ex-Partner: No    Emotionally  Abused: No    Physically Abused: No    Sexually Abused: No    FAMILY HISTORY: Family History  Problem Relation Age of Onset   Clotting disorder Mother    Arthritis Mother    Anemia Mother    Irregular heart beat Mother    High Cholesterol Mother    Heart disease Father    Diabetes Father    Hypertension Father    Asthma Sister    Hypertension Sister    Diabetes Sister    Asthma Sister    Hypertension Sister    Heart attack Sister    Breast cancer Maternal Aunt    Breast cancer Cousin    Asthma Brother    Heart attack Brother     ALLERGIES:  is allergic to dilaudid [hydromorphone hcl].  MEDICATIONS:  Current Outpatient Medications  Medication Sig Dispense Refill   traZODone (DESYREL) 50 MG tablet Take 0.5-1 tablets (25-50 mg total) by mouth at bedtime as needed for sleep. 30 tablet 0   Vitamin D, Ergocalciferol, (DRISDOL) 1.25 MG (50000 UNIT) CAPS capsule Take 1 capsule (50,000 Units total) by mouth every 7 (seven) days. 12 capsule 0   Cholecalciferol 25 MCG (1000 UT) tablet Take by mouth. (Patient not taking: Reported on 11/09/2023)     Multiple Vitamins-Minerals (MULTIVITAMIN GUMMIES WOMENS PO) Take 2 each by mouth daily. [employer] (Patient not taking: Reported on 07/13/2023)     No current facility-administered  medications for this visit.     PHYSICAL EXAMINATION: ECOG PERFORMANCE STATUS: 1 - Symptomatic but completely ambulatory Vitals:   11/09/23 1321  BP: (!) 96/53  Pulse: 73  Resp: 18  Temp: (!) 97.4 F (36.3 C)  SpO2: 100%   Filed Weights   11/09/23 1321  Weight: 170 lb (77.1 kg)    Physical Exam Constitutional:      General: She is not in acute distress. HENT:     Head: Normocephalic and atraumatic.  Eyes:     General: No scleral icterus. Cardiovascular:     Rate and Rhythm: Normal rate and regular rhythm.     Heart sounds: Normal heart sounds.  Pulmonary:     Effort: Pulmonary effort is normal. No respiratory distress.     Breath sounds: No wheezing.  Abdominal:     General: Bowel sounds are normal. There is no distension.     Palpations: Abdomen is soft.  Musculoskeletal:        General: No deformity. Normal range of motion.     Cervical back: Normal range of motion and neck supple.  Skin:    General: Skin is warm and dry.     Findings: No erythema or rash.  Neurological:     Mental Status: She is alert and oriented to person, place, and time. Mental status is at baseline.     Cranial Nerves: No cranial nerve deficit.     Coordination: Coordination normal.  Psychiatric:        Mood and Affect: Mood normal.     LABORATORY DATA:  I have reviewed the data as listed Lab Results  Component Value Date   WBC 4.3 11/06/2023   HGB 12.4 11/06/2023   HCT 38.3 11/06/2023   MCV 86.8 11/06/2023   PLT 279 11/06/2023   Recent Labs    01/26/23 1025 07/31/23 1007  NA 141 140  K 4.3 5.1  CL 108 107  CO2 23 28  GLUCOSE 85 88  BUN 9 9  CREATININE 0.81 0.79  CALCIUM  9.5 9.8  PROT 7.3 7.3  AST 15 16  ALT 15 17  BILITOT 0.4 0.4   Iron/TIBC/Ferritin/ %Sat    Component Value Date/Time   IRON 96 11/06/2023 1241   TIBC 344 11/06/2023 1241   FERRITIN 20 11/06/2023 1241   IRONPCTSAT 28 11/06/2023 1241   IRONPCTSAT 19 01/26/2023 1025       RADIOGRAPHIC  STUDIES: I have personally reviewed the radiological images as listed and agreed with the findings in the report. No results found.

## 2023-11-09 NOTE — Assessment & Plan Note (Signed)
Recommend patient to follow-up with gynecology for management.

## 2023-11-09 NOTE — Assessment & Plan Note (Addendum)
Labs are reviewed and discussed with patient. Lab Results  Component Value Date   HGB 12.4 11/06/2023   TIBC 344 11/06/2023   IRONPCTSAT 28 11/06/2023   FERRITIN 20 11/06/2023    Improved hemoglobin and iron panel Recommend IV Venfoer x1 to further improve her iron store giving that her menstrual period is still quite heavy, although slight better than previous

## 2023-11-09 NOTE — Assessment & Plan Note (Addendum)
She is not taking oral B12 supplementation consistently.  Recommend B12 weekly x 4 followed by monthly injections.  Repeat B12 at next visit.

## 2023-11-16 ENCOUNTER — Inpatient Hospital Stay: Payer: 59

## 2023-11-23 ENCOUNTER — Inpatient Hospital Stay: Payer: 59

## 2023-11-23 DIAGNOSIS — R0982 Postnasal drip: Secondary | ICD-10-CM | POA: Diagnosis not present

## 2023-11-23 DIAGNOSIS — R0981 Nasal congestion: Secondary | ICD-10-CM | POA: Diagnosis not present

## 2023-11-23 DIAGNOSIS — R053 Chronic cough: Secondary | ICD-10-CM | POA: Diagnosis not present

## 2023-11-23 DIAGNOSIS — R0989 Other specified symptoms and signs involving the circulatory and respiratory systems: Secondary | ICD-10-CM | POA: Diagnosis not present

## 2023-11-24 ENCOUNTER — Encounter: Payer: Self-pay | Admitting: Oncology

## 2023-11-30 ENCOUNTER — Inpatient Hospital Stay: Payer: 59 | Attending: Oncology

## 2023-11-30 DIAGNOSIS — E538 Deficiency of other specified B group vitamins: Secondary | ICD-10-CM | POA: Insufficient documentation

## 2023-12-07 ENCOUNTER — Inpatient Hospital Stay: Payer: 59

## 2023-12-07 VITALS — BP 99/52 | HR 62 | Temp 96.3°F | Resp 16

## 2023-12-07 DIAGNOSIS — E538 Deficiency of other specified B group vitamins: Secondary | ICD-10-CM | POA: Diagnosis not present

## 2023-12-07 DIAGNOSIS — D509 Iron deficiency anemia, unspecified: Secondary | ICD-10-CM

## 2023-12-07 MED ORDER — CYANOCOBALAMIN 1000 MCG/ML IJ SOLN
1000.0000 ug | Freq: Once | INTRAMUSCULAR | Status: AC
  Administered 2023-12-07: 1000 ug via INTRAMUSCULAR
  Filled 2023-12-07: qty 1

## 2024-01-04 ENCOUNTER — Inpatient Hospital Stay: Payer: 59 | Attending: Oncology

## 2024-01-04 DIAGNOSIS — Z79899 Other long term (current) drug therapy: Secondary | ICD-10-CM | POA: Insufficient documentation

## 2024-01-04 DIAGNOSIS — E538 Deficiency of other specified B group vitamins: Secondary | ICD-10-CM | POA: Insufficient documentation

## 2024-01-04 DIAGNOSIS — D509 Iron deficiency anemia, unspecified: Secondary | ICD-10-CM

## 2024-01-04 MED ORDER — CYANOCOBALAMIN 1000 MCG/ML IJ SOLN
1000.0000 ug | Freq: Once | INTRAMUSCULAR | Status: AC
Start: 2024-01-04 — End: 2024-01-04
  Administered 2024-01-04: 1000 ug via INTRAMUSCULAR
  Filled 2024-01-04: qty 1

## 2024-01-27 ENCOUNTER — Ambulatory Visit (INDEPENDENT_AMBULATORY_CARE_PROVIDER_SITE_OTHER): Payer: Self-pay | Admitting: Family Medicine

## 2024-01-27 ENCOUNTER — Encounter: Payer: Self-pay | Admitting: Family Medicine

## 2024-01-27 VITALS — BP 98/64 | HR 89 | Resp 16 | Ht 65.0 in | Wt 172.5 lb

## 2024-01-27 DIAGNOSIS — Z1211 Encounter for screening for malignant neoplasm of colon: Secondary | ICD-10-CM | POA: Diagnosis not present

## 2024-01-27 DIAGNOSIS — Z1231 Encounter for screening mammogram for malignant neoplasm of breast: Secondary | ICD-10-CM

## 2024-01-27 DIAGNOSIS — Z0000 Encounter for general adult medical examination without abnormal findings: Secondary | ICD-10-CM

## 2024-01-27 DIAGNOSIS — N920 Excessive and frequent menstruation with regular cycle: Secondary | ICD-10-CM | POA: Diagnosis not present

## 2024-01-27 DIAGNOSIS — R739 Hyperglycemia, unspecified: Secondary | ICD-10-CM | POA: Diagnosis not present

## 2024-01-27 NOTE — Patient Instructions (Signed)
 Levonorgestrel Intrauterine Device (IUD) What is this medication? LEVONORGESTREL (LEE voe nor jes trel) prevents ovulation and pregnancy. It may also be used to treat heavy periods. It belongs to a group of medications called contraceptives. This medication is a progestin hormone. This medicine may be used for other purposes; ask your health care provider or pharmacist if you have questions. COMMON BRAND NAME(S): Cameron Ali What should I tell my care team before I take this medication? They need to know if you have any of these conditions: Abnormal Pap smear Cancer of the breast, uterus, or cervix Diabetes Endometritis Genital or pelvic infection now or in the past Have more than one sexual partner or your partner has more than one partner Heart disease History of an ectopic or tubal pregnancy Immune system problems IUD in place Liver disease or tumor Problems with blood clots or take blood-thinners Seizures Use intravenous drugs Uterus of unusual shape Vaginal bleeding that has not been explained An unusual or allergic reaction to levonorgestrel, other hormones, silicone, or polyethylene, medications, foods, dyes, or preservatives Pregnant or trying to get pregnant Breast-feeding How should I use this medication? This device is placed inside the uterus by your care team. A patient package insert for the product will be given each time it is inserted. Be sure to read this information carefully each time. The sheet may change often. Talk to your care team about use of this medication in children. Special care may be needed. Overdosage: If you think you have taken too much of this medicine contact a poison control center or emergency room at once. NOTE: This medicine is only for you. Do not share this medicine with others. What if I miss a dose? This does not apply. Depending on the brand of device you have inserted, the device will need to be replaced every 3 to 8  years if you wish to continue using this type of birth control. What may interact with this medication? Interactions are not expected. Tell your care team about all the medications you take. This list may not describe all possible interactions. Give your health care provider a list of all the medicines, herbs, non-prescription drugs, or dietary supplements you use. Also tell them if you smoke, drink alcohol, or use illegal drugs. Some items may interact with your medicine. What should I watch for while using this medication? Visit your care team for regular check-ups. Tell your care team if you or your partner becomes HIV positive or gets a sexually transmitted disease. Using this medication does not protect you or your partner against HIV or other sexually transmitted infections (STIs). You can check the placement of the IUD yourself by reaching up to the top of your vagina with clean fingers to feel the threads. Do not pull on the threads. It is a good habit to check placement after each menstrual period. Call your care team right away if you feel more of the IUD than just the threads or if you cannot feel the threads at all. The IUD may come out by itself. You may become pregnant if the device comes out. If you notice that the IUD has come out use a backup birth control method like condoms and call your care team. Using tampons will not change the position of the IUD and are okay to use during your period. This IUD can be safely scanned with magnetic resonance imaging (MRI) only under specific conditions. Before you have an MRI, tell your care team  that you have an IUD in place, and which type of IUD you have in place. What side effects may I notice from receiving this medication? Side effects that you should report to your care team as soon as possible: Allergic reactions--skin rash, itching, hives, swelling of the face, lips, tongue, or throat Blood clot--pain, swelling, or warmth in the leg,  shortness of breath, chest pain Gallbladder problems--severe stomach pain, nausea, vomiting, fever Increase in blood pressure Liver injury--right upper belly pain, loss of appetite, nausea, light-colored stool, dark yellow or brown urine, yellowing skin or eyes, unusual weakness or fatigue New or worsening migraines or headaches Pelvic inflammatory disease (PID)--fever, abdominal pain, pelvic pain, pain or trouble passing urine, spotting, bleeding during or after sex Stroke--sudden numbness or weakness of the face, arm, or leg, trouble speaking, confusion, trouble walking, loss of balance or coordination, dizziness, severe headache, change in vision Unusual vaginal discharge, itching, or odor Vaginal pain, irritation, or sores Worsening mood, feelings of depression Side effects that usually do not require medical attention (report to your care team if they continue or are bothersome): Breast pain or tenderness Dark patches of skin on the face or other sun-exposed areas Irregular menstrual cycles or spotting Nausea Weight gain This list may not describe all possible side effects. Call your doctor for medical advice about side effects. You may report side effects to FDA at 1-800-FDA-1088. Where should I keep my medication? This does not apply. NOTE: This sheet is a summary. It may not cover all possible information. If you have questions about this medicine, talk to your doctor, pharmacist, or health care provider.  2024 Elsevier/Gold Standard (2021-07-24 00:00:00)

## 2024-01-27 NOTE — Progress Notes (Signed)
 Name: Katie Hays   MRN: [id number]    DOB: March 03, 1981   Date:01/27/2024       Progress Note  Subjective  Chief Complaint  Chief Complaint  Patient presents with   Annual Exam    HPI  Patient presents for annual CPE.  Diet: cook at home, balanced diet Exercise:  goes to the gym three days a week  Last Eye Exam: she will schedule it  Last Dental Exam: completed  Flowsheet Row Office Visit from 01/27/2024 in [facility]  AUDIT-C Score 0      Depression: Phq 9 is  negative    01/27/2024    9:05 AM 07/31/2023    9:23 AM 04/02/2023    8:23 AM 01/26/2023    9:20 AM 12/10/2021    9:05 AM  Depression screen PHQ 2/9  Decreased Interest 0 0 0 0 0  Down, Depressed, Hopeless 0 0 0 0 0  PHQ - 2 Score 0 0 0 0 0  Altered sleeping 0 0 0 0 3  Tired, decreased energy 0 0 0 0 3  Change in appetite 0 0 0 0 3  Feeling bad or failure about yourself  0 0 0 0 0  Trouble concentrating 0 0 0 0 0  Moving slowly or fidgety/restless 0 0 0 0 0  Suicidal thoughts 0 0 0 0 0  PHQ-9 Score 0 0 0 0 9  Difficult doing work/chores Not difficult at all  Not difficult at all Not difficult at all    Hypertension: BP Readings from Last 3 Encounters:  01/27/24 98/64  12/07/23 (!) 99/52  11/09/23 (!) 112/49   Obesity: Wt Readings from Last 3 Encounters:  01/27/24 172 lb 8 oz (78.2 kg)  11/09/23 170 lb (77.1 kg)  07/31/23 172 lb 6.4 oz (78.2 kg)   BMI Readings from Last 3 Encounters:  01/27/24 28.71 kg/m  11/09/23 28.29 kg/m  07/31/23 28.69 kg/m     Vaccines: reviewed with the patient.   Hep C Screening: completed STD testing and prevention (HIV/chl/gon/syphilis): N/A Intimate partner violence: negative screen  Sexual History : no problems  Menstrual History/LMP/Abnormal Bleeding: regular , lasts 7 days and heavy for 3 days Discussed importance of follow up if any post-menopausal bleeding: not applicable  Incontinence Symptoms: negative for symptoms   Breast  cancer:  - Last Mammogram: due in April  - BRCA gene screening: N/A  Osteoporosis Prevention : Discussed high calcium and vitamin D supplementation, weight bearing exercises Bone density :not applicable   Cervical cancer screening: up-to-date  Skin cancer: Discussed monitoring for atypical lesions  Colorectal cancer: referral placed    Lung cancer:  Low Dose CT Chest recommended if Age 4-80 years, 20 pack-year currently smoking OR have quit w/in 15years. Patient does not qualify for screen     Advanced Care Planning: A voluntary discussion about advance care planning including the explanation and discussion of advance directives.  Discussed health care proxy and Living will, and the patient was able to identify a health care proxy as husband .  Patient does have a living will and power of attorney of health care   Patient Active Problem List   Diagnosis Date Noted   Primary insomnia 07/31/2023   Menorrhagia 07/13/2023   Low serum vitamin B12 07/13/2023   Iron deficiency anemia 12/16/2021   History of pneumonia 12/06/2020   Hyperglycemia 01/31/2020   Pericardial effusion 12/31/2019   Vitamin D deficiency 01/03/2019   Family history of  heart disease in female family member before age 77 07/04/2016   MVP (mitral valve prolapse) 10/28/2015   Migraine headache 10/25/2015   Cervical high risk HPV (human papillomavirus) test positive 07/18/2015   H/O cesarean section 12/02/2012   History of blood transfusion 12/02/2012    Past Surgical History:  Procedure Laterality Date   CESAREAN SECTION  04/30/2016   04/16/2012   TUBAL LIGATION Bilateral 04/30/2016    Family History  Problem Relation Age of Onset   Clotting disorder Mother    Arthritis Mother    Anemia Mother    Irregular heart beat Mother    High Cholesterol Mother    Heart disease Father    Diabetes Father    Hypertension Father    Asthma Sister    Hypertension Sister    Diabetes Sister    Asthma Sister     Hypertension Sister    Heart attack Sister    Breast cancer Maternal Aunt    Breast cancer Cousin    Asthma Brother    Heart attack Brother     Social History   Socioeconomic History   Marital status: Married    Spouse name: Patrcia Dolly   Number of children: 2   Years of education: Not on file   Highest education level: Bachelor's degree (e.g., BA, AB, BS)  Occupational History   Occupation: real state appraisal  Tobacco Use   Smoking status: Never   Smokeless tobacco: Never  Vaping Use   Vaping status: Never Used  Substance and Sexual Activity   Alcohol use: Never    Comment: never   Drug use: No   Sexual activity: Yes    Partners: Male    Birth control/protection: Other-see comments    Comment: Tubal Ligation  Other Topics Concern   Not on file  Social History Narrative   Married and has two children, last one born in 2017    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   Social Drivers of Health   Financial Resource Strain: Low Risk  (01/27/2024)   Overall Financial Resource Strain (CARDIA)    Difficulty of Paying Living Expenses: Not hard at all  Food Insecurity: No Food Insecurity (01/27/2024)   Hunger Vital Sign    Worried About Running Out of Food in the Last Year: Never true    Ran Out of Food in the Last Year: Never true  Transportation Needs: No Transportation Needs (01/27/2024)   PRAPARE - Administrator, Civil Service (Medical): No    Lack of Transportation (Non-Medical): No  Physical Activity: Sufficiently Active (01/27/2024)   Exercise Vital Sign    Days of Exercise per Week: 3 days    Minutes of Exercise per Session: 60 min  Stress: Stress Concern Present (01/27/2024)   [employer] of Occupational Health - Occupational Stress Questionnaire    Feeling of Stress : Very much  Social Connections: Socially Integrated (01/27/2024)   Social Connection and Isolation Panel [NHANES]    Frequency of Communication with Friends and Family: More than three times  a week    Frequency of Social Gatherings with Friends and Family: Twice a week    Attends Religious Services: More than 4 times per year    Active Member of [employer] or Organizations: Yes    Attends Banker Meetings: More than 4 times per year    Marital Status: Married  Catering manager Violence: Not At Risk (01/27/2024)   Humiliation, Afraid, Rape, and Kick questionnaire  Fear of Current or Ex-Partner: No    Emotionally Abused: No    Physically Abused: No    Sexually Abused: No     Current Outpatient Medications:    Cholecalciferol 25 MCG (1000 UT) tablet, Take by mouth., Disp: , Rfl:    ipratropium (ATROVENT) 0.03 % nasal spray, Place into the nose., Disp: , Rfl:    Multiple Vitamins-Minerals (MULTIVITAMIN GUMMIES WOMENS PO), Take 2 each by mouth daily. VitaFusion Organic, Disp: , Rfl:    Vitamin D, Ergocalciferol, (DRISDOL) 1.25 MG (50000 UNIT) CAPS capsule, Take 1 capsule (50,000 Units total) by mouth every 7 (seven) days., Disp: 12 capsule, Rfl: 0   traZODone (DESYREL) 50 MG tablet, Take 0.5-1 tablets (25-50 mg total) by mouth at bedtime as needed for sleep. (Patient not taking: Reported on 01/27/2024), Disp: 30 tablet, Rfl: 0  Allergies  Allergen Reactions   Dilaudid [Hydromorphone Hcl] Swelling     ROS  Constitutional: Negative for fever or weight change.  Respiratory: Negative for cough and shortness of breath.   Cardiovascular: Negative for chest pain or palpitations.  Gastrointestinal: Negative for abdominal pain, no bowel changes.  Musculoskeletal: Negative for gait problem or joint swelling.  Skin: Negative for rash.  Neurological: Negative for dizziness or headache.  No other specific complaints in a complete review of systems (except as listed in HPI above).   Objective  Vitals:   01/27/24 0908  BP: 98/64  Pulse: 89  Resp: 16  SpO2: 100%  Weight: 172 lb 8 oz (78.2 kg)  Height: 5\' 5"  (1.651 m)    Body mass index is 28.71 kg/m.  Physical  Exam  Constitutional: Patient appears well-developed and well-nourished. No distress.  HENT: Head: Normocephalic and atraumatic. Ears: B TMs ok, no erythema or effusion; Nose: Nose normal. Mouth/Throat: Oropharynx is clear and moist. No oropharyngeal exudate.  Eyes: Conjunctivae and EOM are normal. Pupils are equal, round, and reactive to light. No scleral icterus.  Neck: Normal range of motion. Neck supple. No JVD present. No thyromegaly present.  Cardiovascular: Normal rate, regular rhythm and normal heart sounds.  No murmur heard. No BLE edema. Pulmonary/Chest: Effort normal and breath sounds normal. No respiratory distress. Abdominal: Soft. Bowel sounds are normal, no distension. There is no tenderness. no masses Breast: no lumps or masses, no nipple discharge or rashes FEMALE GENITALIA:  Not done  RECTAL:not done  Musculoskeletal: Normal range of motion, no joint effusions. No gross deformities Neurological: he is alert and oriented to person, place, and time. No cranial nerve deficit. Coordination, balance, strength, speech and gait are normal.  Skin: Skin is warm and dry. No rash noted. No erythema.  Psychiatric: Patient has a normal mood and affect. behavior is normal. Judgment and thought content normal.     Assessment & Plan  1. Well adult exam (Primary)  - Ambulatory referral to Gastroenterology - Hemoglobin A1c - COMPLETE METABOLIC PANEL WITH GFR  2. Menorrhagia with regular cycle   3. Colon cancer screening  - Ambulatory referral to Gastroenterology  4. Hyperglycemia  - Hemoglobin A1c  5. Breast cancer screening by mammogram  - MM 3D SCREENING MAMMOGRAM BILATERAL BREAST; Future    -USPSTF grade A and B recommendations reviewed with patient; age-appropriate recommendations, preventive care, screening tests, etc discussed and encouraged; healthy living encouraged; see AVS for patient education given to patient -Discussed importance of 150 minutes of physical  activity weekly, eat two servings of fish weekly, eat one serving of tree nuts ( cashews, pistachios, pecans, almonds.Marland Kitchen)  every other day, eat 6 servings of fruit/vegetables daily and drink plenty of water and avoid sweet beverages.   -Reviewed Health Maintenance: Yes.

## 2024-01-28 ENCOUNTER — Telehealth: Payer: Self-pay | Admitting: Obstetrics and Gynecology

## 2024-01-28 ENCOUNTER — Encounter: Payer: Self-pay | Admitting: Family Medicine

## 2024-01-28 DIAGNOSIS — Z30014 Encounter for initial prescription of intrauterine contraceptive device: Secondary | ICD-10-CM

## 2024-01-28 LAB — COMPLETE METABOLIC PANEL WITH GFR
AG Ratio: 1.3 (calc) (ref 1.0–2.5)
ALT: 11 U/L (ref 6–29)
AST: 12 U/L (ref 10–35)
Albumin: 3.9 g/dL (ref 3.6–5.1)
Alkaline phosphatase (APISO): 52 U/L (ref 31–125)
BUN: 9 mg/dL (ref 7–25)
CO2: 27 mmol/L (ref 20–32)
Calcium: 9.1 mg/dL (ref 8.6–10.2)
Chloride: 107 mmol/L (ref 98–110)
Creat: 0.73 mg/dL (ref 0.50–0.99)
Globulin: 2.9 g/dL (ref 1.9–3.7)
Glucose, Bld: 85 mg/dL (ref 65–99)
Potassium: 4 mmol/L (ref 3.5–5.3)
Sodium: 139 mmol/L (ref 135–146)
Total Bilirubin: 0.4 mg/dL (ref 0.2–1.2)
Total Protein: 6.8 g/dL (ref 6.1–8.1)
eGFR: 103 mL/min/{1.73_m2} (ref >=60)

## 2024-01-28 LAB — HEMOGLOBIN A1C
Hgb A1c MFr Bld: 5.5 %{Hb} (ref <5.7)
Mean Plasma Glucose: 111 mg/dL
eAG (mmol/L): 6.2 mmol/L

## 2024-01-28 MED ORDER — MISOPROSTOL 100 MCG PO TABS: 100.0000 ug | ORAL_TABLET | Freq: Once | ORAL | 0 refills | Status: DC

## 2024-01-28 NOTE — Telephone Encounter (Addendum)
 Dr. Carlynn Purl reached out to me and stated pt would like Mirena IUD for menorrhagia. LMP 01/21/24. Pt going out of town this wknd. Will RTO 02/02/24 for Mirena insertion. Rx cytotec/NSAIDs 1 hr before appt. Questions answered.  Pt s/p TL.   Meds ordered this encounter  Medications   misoprostol (CYTOTEC) 100 MCG tablet    Sig: Take 1 tablet (100 mcg total) by mouth once for 1 dose. 1 hour before appt    Dispense:  1 tablet    Refill:  0    Supervising Provider:   Hildred Laser [[id number]]

## 2024-02-01 ENCOUNTER — Inpatient Hospital Stay: Payer: 59 | Attending: Oncology

## 2024-02-01 DIAGNOSIS — Z79899 Other long term (current) drug therapy: Secondary | ICD-10-CM | POA: Diagnosis not present

## 2024-02-01 DIAGNOSIS — D509 Iron deficiency anemia, unspecified: Secondary | ICD-10-CM

## 2024-02-01 DIAGNOSIS — E538 Deficiency of other specified B group vitamins: Secondary | ICD-10-CM | POA: Insufficient documentation

## 2024-02-01 MED ORDER — CYANOCOBALAMIN 1000 MCG/ML IJ SOLN
1000.0000 ug | Freq: Once | INTRAMUSCULAR | Status: AC
  Administered 2024-02-01: 1000 ug via INTRAMUSCULAR
  Filled 2024-02-01: qty 1

## 2024-02-02 ENCOUNTER — Ambulatory Visit: Admitting: Obstetrics and Gynecology

## 2024-02-02 ENCOUNTER — Encounter: Payer: Self-pay | Admitting: Obstetrics and Gynecology

## 2024-02-02 VITALS — BP 100/64 | Ht 66.0 in | Wt 172.0 lb

## 2024-02-02 DIAGNOSIS — N920 Excessive and frequent menstruation with regular cycle: Secondary | ICD-10-CM

## 2024-02-02 DIAGNOSIS — Z30430 Encounter for insertion of intrauterine contraceptive device: Secondary | ICD-10-CM | POA: Diagnosis not present

## 2024-02-02 MED ORDER — LEVONORGESTREL 20 MCG/DAY IU IUD
1.0000 | INTRAUTERINE_SYSTEM | Freq: Once | INTRAUTERINE | Status: AC
Start: 2024-02-02 — End: 2024-02-02
  Administered 2024-02-02: 1 via INTRAUTERINE

## 2024-02-02 NOTE — Patient Instructions (Addendum)
 I value your feedback and you entrusting [place] with your care. If you get a  patient survey, I would appreciate you taking the time to let us know about your experience today. Thank you!  Instructions after IUD insertion  Most women experience no significant problems after insertion of an IUD, however minor cramping and spotting for a few days is common. Cramps may be treated with ibuprofen 800mg  every 8 hours or Tylenol 650 mg every 4 hours. Contact [facility] immediately if you experience any of the following symptoms during the next week: temperature >99.6 degrees, worsening pelvic pain, abdominal pain, fainting, unusually heavy vaginal bleeding, foul vaginal discharge, or if you think you have expelled the IUD. Nothing inserted in the vagina for 48 hours. You will be scheduled for a follow up visit in approximately four weeks.

## 2024-02-02 NOTE — Progress Notes (Signed)
   Chief Complaint  Patient presents with   Mirena insertion    IUD PROCEDURE NOTE:  Katie Hays is a 43 y.o. [id number] here for Mirena  IUD insertion for menorrhagia, s/p TL. Has 2 small leio per 8/24 GYN u/s. Has IDA but labs improved 12/24, getting Fe infusions. Would like to try IUD for sx.    BP 100/64   Ht 5\' 6"  (1.676 m)   Wt 172 lb (78 kg)   LMP 01/21/2024 (Exact Date)   BMI 27.76 kg/m   IUD Insertion Procedure Note Patient identified, informed consent performed, consent signed.   Discussed risks of irregular bleeding, cramping, infection, malpositioning or misplacement of the IUD outside the uterus which may require further procedure such as laparoscopy, risk of failure <1%. Time out was performed.    Speculum placed in the vagina.  Cervix visualized.  Cleaned with Betadine x 2.  Grasped anteriorly with a single tooth tenaculum.  Uterus sounded to 8.0 cm.   IUD placed per manufacturer's recommendations.  Strings trimmed to 3 cm. Tenaculum was removed, good hemostasis noted.  Patient tolerated procedure well.   ASSESSMENT:  Encounter for insertion of intrauterine contraceptive device (IUD) - Plan: levonorgestrel (MIRENA) 20 MCG/DAY IUD 1 each  Menorrhagia with regular cycle--try IUD. F/u prn.    Meds ordered this encounter  Medications   levonorgestrel (MIRENA) 20 MCG/DAY IUD 1 each     Plan:  Patient was given post-procedure instructions.  She was advised to have backup contraception for one week.   Call if you are having increasing pain, cramps or bleeding or if you have a fever greater than 100.4 degrees F., shaking chills, nausea or vomiting. Patient was also asked to check IUD strings periodically and follow up in 4 weeks for IUD check.  Return in about 4 weeks (around 03/01/2024) for IUD f/u.  Kailiana Granquist B. Jemiah Ellenburg, PA-C 02/02/2024 4:50 PM

## 2024-02-04 ENCOUNTER — Encounter: Payer: Self-pay | Admitting: Oncology

## 2024-02-04 ENCOUNTER — Other Ambulatory Visit: Payer: Self-pay | Admitting: *Deleted

## 2024-02-04 ENCOUNTER — Telehealth: Payer: Self-pay | Admitting: *Deleted

## 2024-02-04 DIAGNOSIS — Z1211 Encounter for screening for malignant neoplasm of colon: Secondary | ICD-10-CM

## 2024-02-04 DIAGNOSIS — L6612 Frontal fibrosing alopecia: Secondary | ICD-10-CM | POA: Diagnosis not present

## 2024-02-04 DIAGNOSIS — L630 Alopecia (capitis) totalis: Secondary | ICD-10-CM | POA: Diagnosis not present

## 2024-02-04 MED ORDER — NA SULFATE-K SULFATE-MG SULF 17.5-3.13-1.6 GM/177ML PO SOLN
1.0000 | Freq: Once | ORAL | 0 refills | Status: AC
Start: 2024-02-04 — End: 2024-02-04

## 2024-02-04 NOTE — Telephone Encounter (Signed)
 Gastroenterology Pre-Procedure Review  Request Date: 04/06/2024 Requesting Physician: Dr. Allegra Lai  PATIENT REVIEW QUESTIONS: The patient responded to the following health history questions as indicated:    1. Are you having any GI issues? no 2. Do you have a personal history of Polyps? no 3. Do you have a family history of Colon Cancer or Polyps? no 4. Diabetes Mellitus? no 5. Joint replacements in the past 12 months?no 6. Major health problems in the past 3 months?no 7. Any artificial heart valves, MVP, or defibrillator?no    MEDICATIONS & ALLERGIES:    Patient reports the following regarding taking any anticoagulation/antiplatelet therapy:   Plavix, Coumadin, Eliquis, Xarelto, Lovenox, Pradaxa, Brilinta, or Effient? no Aspirin? no  Patient confirms/reports the following medications:  Current Outpatient Medications  Medication Sig Dispense Refill   Cholecalciferol 25 MCG (1000 UT) tablet Take by mouth.     ipratropium (ATROVENT) 0.03 % nasal spray Place into the nose.     levonorgestrel (MIRENA) 20 MCG/DAY IUD 1 each by Intrauterine route once.     misoprostol (CYTOTEC) 100 MCG tablet Take 1 tablet (100 mcg total) by mouth once for 1 dose. 1 hour before appt 1 tablet 0   Multiple Vitamins-Minerals (MULTIVITAMIN GUMMIES WOMENS PO) Take 2 each by mouth daily. VitaFusion Organic     traZODone (DESYREL) 50 MG tablet Take 0.5-1 tablets (25-50 mg total) by mouth at bedtime as needed for sleep. 30 tablet 0   Vitamin D, Ergocalciferol, (DRISDOL) 1.25 MG (50000 UNIT) CAPS capsule Take 1 capsule (50,000 Units total) by mouth every 7 (seven) days. 12 capsule 0   No current facility-administered medications for this visit.    Patient confirms/reports the following allergies:  Allergies  Allergen Reactions   Dilaudid [Hydromorphone Hcl] Swelling    No orders of the defined types were placed in this encounter.   AUTHORIZATION INFORMATION Primary Insurance: 1D#: Group #:  Secondary  Insurance: 1D#: Group #:  SCHEDULE INFORMATION: Date: 04/06/2024 Time: Location:  [facility]

## 2024-02-29 NOTE — Progress Notes (Unsigned)
   No chief complaint on file.    History of Present Illness:  Katie Hays is a 43 y.o. that had a Mirena IUD placed approximately 1 month ago for menorrhagia/IDA  Has 2 small leio per 8/24 GYN u/s. Since that time, she denies dyspareunia, pelvic pain, non-menstrual bleeding, vaginal d/c, heavy bleeding.   Review of Systems  Physical Exam:  There were no vitals taken for this visit. There is no height or weight on file to calculate BMI.  Pelvic exam:  Two IUD strings {DESC; PRESENT/ABSENT:17923} seen coming from the cervical os. EGBUS, vaginal vault and cervix: within normal limits   Assessment:   No diagnosis found.  IUD strings present in proper location; pt doing well  Plan: F/u if any signs of infection or can no longer feel the strings.   Katie Chhim B. Earvin Blazier, PA-C 02/29/2024 4:48 PM

## 2024-03-01 ENCOUNTER — Encounter: Payer: Self-pay | Admitting: Obstetrics and Gynecology

## 2024-03-01 ENCOUNTER — Ambulatory Visit: Admitting: Obstetrics and Gynecology

## 2024-03-01 VITALS — BP 95/57 | HR 68 | Ht 66.0 in | Wt 173.0 lb

## 2024-03-01 DIAGNOSIS — Z30431 Encounter for routine checking of intrauterine contraceptive device: Secondary | ICD-10-CM | POA: Diagnosis not present

## 2024-03-01 NOTE — Patient Instructions (Signed)
 I value your feedback and you entrusting [place] with your care. If you get a [place] patient survey, I would appreciate you taking the time to let us know about your experience today. Thank you! ? ? ?

## 2024-03-04 ENCOUNTER — Inpatient Hospital Stay: Payer: 59 | Attending: Oncology

## 2024-03-04 DIAGNOSIS — D500 Iron deficiency anemia secondary to blood loss (chronic): Secondary | ICD-10-CM

## 2024-03-04 DIAGNOSIS — E538 Deficiency of other specified B group vitamins: Secondary | ICD-10-CM | POA: Insufficient documentation

## 2024-03-04 LAB — IRON AND TIBC
Iron: 106 ug/dL (ref 28–170)
Saturation Ratios: 30 % (ref 10.4–31.8)
TIBC: 356 ug/dL (ref 250–450)
UIBC: 250 ug/dL

## 2024-03-04 LAB — RETIC PANEL
Immature Retic Fract: 7.6 % (ref 2.3–15.9)
RBC.: 4.61 MIL/uL (ref 3.87–5.11)
Retic Count, Absolute: 66.8 10*3/uL (ref 19.0–186.0)
Retic Ct Pct: 1.5 % (ref 0.4–3.1)
Reticulocyte Hemoglobin: 29.8 pg (ref >27.9)

## 2024-03-04 LAB — CBC WITH DIFFERENTIAL (CANCER CENTER ONLY)
Abs Immature Granulocytes: 0.01 10*3/uL (ref 0.00–0.07)
Basophils Absolute: 0 10*3/uL (ref 0.0–0.1)
Basophils Relative: 1 %
Eosinophils Absolute: 0.2 10*3/uL (ref 0.0–0.5)
Eosinophils Relative: 5 %
HCT: 40.7 % (ref 36.0–46.0)
Hemoglobin: 13.1 g/dL (ref 12.0–15.0)
Immature Granulocytes: 0 %
Lymphocytes Relative: 47 %
Lymphs Abs: 1.8 10*3/uL (ref 0.7–4.0)
MCH: 28.5 pg (ref 26.0–34.0)
MCHC: 32.2 g/dL (ref 30.0–36.0)
MCV: 88.5 fL (ref 80.0–100.0)
Monocytes Absolute: 0.3 10*3/uL (ref 0.1–1.0)
Monocytes Relative: 9 %
Neutro Abs: 1.4 10*3/uL — ABNORMAL LOW (ref 1.7–7.7)
Neutrophils Relative %: 38 %
Platelet Count: 281 10*3/uL (ref 150–400)
RBC: 4.6 MIL/uL (ref 3.87–5.11)
RDW: 13.4 % (ref 11.5–15.5)
WBC Count: 3.8 10*3/uL — ABNORMAL LOW (ref 4.0–10.5)
nRBC: 0 % (ref 0.0–0.2)

## 2024-03-04 LAB — VITAMIN B12: Vitamin B-12: 400 pg/mL (ref 180–914)

## 2024-03-04 LAB — FERRITIN: Ferritin: 8 ng/mL — ABNORMAL LOW (ref 11–307)

## 2024-03-09 ENCOUNTER — Inpatient Hospital Stay: Payer: 59

## 2024-03-09 ENCOUNTER — Inpatient Hospital Stay: Payer: 59 | Admitting: Oncology

## 2024-03-30 ENCOUNTER — Encounter (HOSPITAL_COMMUNITY): Payer: Self-pay

## 2024-04-05 ENCOUNTER — Encounter: Payer: Self-pay | Admitting: Gastroenterology

## 2024-04-06 ENCOUNTER — Encounter
Admission: RE | Disposition: A | Discharge status: Home / Self Care | Payer: Self-pay | Source: Home / Self Care | Attending: Gastroenterology

## 2024-04-06 ENCOUNTER — Ambulatory Visit: Admitting: Anesthesiology

## 2024-04-06 ENCOUNTER — Ambulatory Visit
Admission: RE | Admit: 2024-04-06 | Discharge: 2024-04-06 | Disposition: A | Discharge status: Home / Self Care | Attending: Gastroenterology | Admitting: Gastroenterology

## 2024-04-06 DIAGNOSIS — I341 Nonrheumatic mitral (valve) prolapse: Secondary | ICD-10-CM | POA: Diagnosis not present

## 2024-04-06 DIAGNOSIS — Z1211 Encounter for screening for malignant neoplasm of colon: Secondary | ICD-10-CM

## 2024-04-06 DIAGNOSIS — Q438 Other specified congenital malformations of intestine: Secondary | ICD-10-CM | POA: Insufficient documentation

## 2024-04-06 HISTORY — DX: Nonrheumatic mitral (valve) prolapse: I34.1

## 2024-04-06 HISTORY — DX: Personal history of other medical treatment: Z92.89

## 2024-04-06 HISTORY — PX: COLONOSCOPY: SHX5424

## 2024-04-06 LAB — POCT PREGNANCY, URINE: Preg Test, Ur: NEGATIVE

## 2024-04-06 SURGERY — COLONOSCOPY
Anesthesia: General

## 2024-04-06 MED ORDER — PROPOFOL 10 MG/ML IV BOLUS
INTRAVENOUS | Status: AC
  Filled 2024-04-06: qty 40

## 2024-04-06 MED ORDER — STERILE WATER FOR IRRIGATION IR SOLN
Status: DC | PRN
  Administered 2024-04-06: 120 mL

## 2024-04-06 MED ORDER — SODIUM CHLORIDE 0.9 % IV SOLN: INTRAVENOUS | Status: DC

## 2024-04-06 MED ORDER — PROPOFOL 10 MG/ML IV BOLUS
INTRAVENOUS | Status: DC | PRN
  Administered 2024-04-06: 50 mg via INTRAVENOUS
  Administered 2024-04-06: 100 mg via INTRAVENOUS
  Administered 2024-04-06: 50 mg via INTRAVENOUS
  Administered 2024-04-06: 20 mg via INTRAVENOUS
  Administered 2024-04-06: 30 mg via INTRAVENOUS

## 2024-04-06 MED ORDER — DEXMEDETOMIDINE HCL IN NACL 80 MCG/20ML IV SOLN
INTRAVENOUS | Status: DC | PRN
  Administered 2024-04-06: 4 ug via INTRAVENOUS

## 2024-04-06 MED ORDER — LIDOCAINE HCL (CARDIAC) PF 100 MG/5ML IV SOSY
PREFILLED_SYRINGE | INTRAVENOUS | Status: DC | PRN
  Administered 2024-04-06: 60 mg via INTRAVENOUS

## 2024-04-06 MED ORDER — LIDOCAINE HCL (PF) 2 % IJ SOLN
INTRAMUSCULAR | Status: AC
  Filled 2024-04-06: qty 5

## 2024-04-06 NOTE — H&P (Signed)
 Karma Oz, MD [address]  [address]  [place], [place] [address]  Main: [phone]  Fax: [phone] Pager: [phone]  Primary Care Physician:  Sowles, Krichna, MD Primary Gastroenterologist:  Dr. Karma Oz  Pre-Procedure History & Physical: HPI:  Katie Hays is a 43 y.o. female is here for an colonoscopy.   Past Medical History:  Diagnosis Date   Anemia    Chronic nonintractable headache    History of blood transfusion    History of meningitis    History of pneumonia    Hyperglycemia    MVP (mitral valve prolapse)    Pericardial effusion    Vitamin D  deficiency     Past Surgical History:  Procedure Laterality Date   CESAREAN SECTION  04/30/2016   04/16/2012   TUBAL LIGATION Bilateral 04/30/2016    Prior to Admission medications   Medication Sig Start Date End Date Taking? Authorizing Provider  ferrous sulfate  325 (65 FE) MG EC tablet Take 325 mg by mouth 3 (three) times daily with meals.   Yes [provider]  nortriptyline (PAMELOR) 10 MG capsule Take 10 mg by mouth at bedtime.   Yes [provider]  Cholecalciferol 25 MCG (1000 UT) tablet Take by mouth.    [provider]  ipratropium (ATROVENT) 0.03 % nasal spray Place into the nose. 11/23/23 11/22/24  [provider]  levonorgestrel  (MIRENA ) 20 MCG/DAY IUD 1 each by Intrauterine route once. 02/02/24   Copland, Alicia B, PA-C  misoprostol  (CYTOTEC ) 100 MCG tablet Take 1 tablet (100 mcg total) by mouth once for 1 dose. 1 hour before appt 01/28/24 01/28/24  Copland, Alicia B, PA-C  Multiple Vitamins-Minerals (MULTIVITAMIN GUMMIES WOMENS PO) Take 2 each by mouth daily. VitaFusion Organic    [provider]  traZODone  (DESYREL ) 50 MG tablet Take 0.5-1 tablets (25-50 mg total) by mouth at bedtime as needed for sleep. 07/31/23   Sowles, Krichna, MD  Vitamin D , Ergocalciferol , (DRISDOL ) 1.25 MG (50000 UNIT) CAPS capsule Take 1 capsule (50,000 Units total) by  mouth every 7 (seven) days. 08/02/23   Sowles, Krichna, MD    Allergies as of 02/04/2024 - Review Complete 02/02/2024  Allergen Reaction Noted   Dilaudid [hydromorphone hcl] Swelling 11/07/2015    Family History  Problem Relation Age of Onset   Clotting disorder Mother    Arthritis Mother    Anemia Mother    Irregular heart beat Mother    High Cholesterol Mother    Heart disease Father    Diabetes Father    Hypertension Father    Asthma Sister    Hypertension Sister    Diabetes Sister    Asthma Sister    Hypertension Sister    Heart attack Sister    Breast cancer Maternal Aunt    Breast cancer Cousin    Asthma Brother    Heart attack Brother     Social History   Socioeconomic History   Marital status: Married    Spouse name: Drury Geralds   Number of children: 2   Years of education: Not on file   Highest education level: Bachelor's degree (e.g., BA, AB, BS)  Occupational History   Occupation: real state appraisal  Tobacco Use   Smoking status: Never   Smokeless tobacco: Never  Vaping Use   Vaping status: Never Used  Substance and Sexual Activity   Alcohol use: Never    Comment: never   Drug use: No   Sexual activity: Yes  Partners: Male    Birth control/protection: Other-see comments    Comment: Tubal Ligation  Other Topics Concern   Not on file  Social History Narrative   Married and has two children, last one born in 2017    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   Social Drivers of Health   Financial Resource Strain: Low Risk  (01/27/2024)   Overall Financial Resource Strain (CARDIA)    Difficulty of Paying Living Expenses: Not hard at all  Food Insecurity: No Food Insecurity (01/27/2024)   Hunger Vital Sign    Worried About Running Out of Food in the Last Year: Never true    Ran Out of Food in the Last Year: Never true  Transportation Needs: No Transportation Needs (01/27/2024)   PRAPARE - Administrator, Civil Service (Medical): No    Lack of  Transportation (Non-Medical): No  Physical Activity: Sufficiently Active (01/27/2024)   Exercise Vital Sign    Days of Exercise per Week: 3 days    Minutes of Exercise per Session: 60 min  Stress: Stress Concern Present (01/27/2024)   [employer] of Occupational Health - Occupational Stress Questionnaire    Feeling of Stress : Very much  Social Connections: Socially Integrated (01/27/2024)   Social Connection and Isolation Panel [NHANES]    Frequency of Communication with Friends and Family: More than three times a week    Frequency of Social Gatherings with Friends and Family: Twice a week    Attends Religious Services: More than 4 times per year    Active Member of [employer] or Organizations: Yes    Attends Engineer, structural: More than 4 times per year    Marital Status: Married  Catering manager Violence: Not At Risk (01/27/2024)   Humiliation, Afraid, Rape, and Kick questionnaire    Fear of Current or Ex-Partner: No    Emotionally Abused: No    Physically Abused: No    Sexually Abused: No    Review of Systems: See HPI, otherwise negative ROS  Physical Exam: BP 105/62   Pulse 67   Temp (!) 97.3 F (36.3 C) (Temporal)   Resp 16   Ht 5\' 6"  (1.676 m)   Wt 77.1 kg   SpO2 100%   BMI 27.44 kg/m  General:   Alert,  pleasant and cooperative in NAD Head:  Normocephalic and atraumatic. Neck:  Supple; no masses or thyromegaly. Lungs:  Clear throughout to auscultation.    Heart:  Regular rate and rhythm. Abdomen:  Soft, nontender and nondistended. Normal bowel sounds, without guarding, and without rebound.   Neurologic:  Alert and  oriented x4;  grossly normal neurologically.  Impression/Plan: Katie Hays is here for an colonoscopy to be performed for colon cancer screening  Risks, benefits, limitations, and alternatives regarding  colonoscopy have been reviewed with the patient.  Questions have been answered.  All parties agreeable.   Ellis Guys, MD   04/06/2024, 8:11 AM

## 2024-04-06 NOTE — Anesthesia Preprocedure Evaluation (Signed)
 Anesthesia Evaluation  Patient identified by MRN, date of birth, ID band Patient awake    Reviewed: Allergy & Precautions, NPO status , Patient's Chart, lab work & pertinent test results  History of Anesthesia Complications Negative for: history of anesthetic complications  Airway Mallampati: I   Neck ROM: Full    Dental no notable dental hx.    Pulmonary neg pulmonary ROS   Pulmonary exam normal breath sounds clear to auscultation       Cardiovascular Exercise Tolerance: Good Normal cardiovascular exam+ Valvular Problems/Murmurs MVP  Rhythm:Regular Rate:Normal     Neuro/Psych  Headaches    GI/Hepatic negative GI ROS,,,  Endo/Other  negative endocrine ROS    Renal/GU negative Renal ROS     Musculoskeletal   Abdominal   Peds  Hematology  (+) Blood dyscrasia, anemia   Anesthesia Other Findings   Reproductive/Obstetrics                             Anesthesia Physical Anesthesia Plan  ASA: 2  Anesthesia Plan: General   Post-op Pain Management:    Induction: Intravenous  PONV Risk Score and Plan: 3 and Propofol infusion, TIVA and Treatment may vary due to age or medical condition  Airway Management Planned: Natural Airway  Additional Equipment:   Intra-op Plan:   Post-operative Plan:   Informed Consent: I have reviewed the patients History and Physical, chart, labs and discussed the procedure including the risks, benefits and alternatives for the proposed anesthesia with the patient or authorized representative who has indicated his/her understanding and acceptance.       Plan Discussed with: CRNA  Anesthesia Plan Comments: (LMA/GETA backup discussed.  Patient consented for risks of anesthesia including but not limited to:  - adverse reactions to medications - damage to eyes, teeth, lips or other oral mucosa - nerve damage due to positioning  - sore throat or  hoarseness - damage to heart, brain, nerves, lungs, other parts of body or loss of life  Informed patient about role of CRNA in peri- and intra-operative care.  Patient voiced understanding.)       Anesthesia Quick Evaluation

## 2024-04-06 NOTE — Op Note (Signed)
 [facility] Gastroenterology Patient Name: Katie Hays Procedure Date: 04/06/2024 7:54 AM MRN: [id number] Account #: [id number] Date of Birth: 05/26/1981 Admit Type: Outpatient Age: 43 Room: [facility] ENDO ROOM 4 Gender: Female Note Status: Finalized Instrument Name: Charlyn Cooley [id number] Procedure:             Colonoscopy Indications:           Screening for colorectal malignant neoplasm, This is                         the patient's first colonoscopy Providers:             Selena Daily MD, MD Referring MD:          Lavonna Prader. Sowles, MD (Referring MD) Medicines:             General Anesthesia Complications:         No immediate complications. Estimated blood loss: None. Procedure:             Pre-Anesthesia Assessment:                        - Prior to the procedure, a History and Physical was                         performed, and patient medications and allergies were                         reviewed. The patient is competent. The risks and                         benefits of the procedure and the sedation options and                         risks were discussed with the patient. All questions                         were answered and informed consent was obtained.                         Patient identification and proposed procedure were                         verified by the physician, the nurse, the                         anesthesiologist, the anesthetist and the technician                         in the pre-procedure area in the procedure room in the                         endoscopy suite. Mental Status Examination: alert and                         oriented. Airway Examination: normal oropharyngeal                         airway and neck mobility. Respiratory Examination:  clear to auscultation. CV Examination: normal.                         Prophylactic Antibiotics: The patient does not require                          prophylactic antibiotics. Prior Anticoagulants: The                         patient has taken no anticoagulant or antiplatelet                         agents. ASA Grade Assessment: II - A patient with mild                         systemic disease. After reviewing the risks and                         benefits, the patient was deemed in satisfactory                         condition to undergo the procedure. The anesthesia                         plan was to use general anesthesia. Immediately prior                         to administration of medications, the patient was                         re-assessed for adequacy to receive sedatives. The                         heart rate, respiratory rate, oxygen saturations,                         blood pressure, adequacy of pulmonary ventilation, and                         response to care were monitored throughout the                         procedure. The physical status of the patient was                         re-assessed after the procedure.                        After obtaining informed consent, the colonoscope was                         passed under direct vision. Throughout the procedure,                         the patient's blood pressure, pulse, and oxygen                         saturations were monitored continuously. The  Colonoscope was introduced through the anus and                         advanced to the the cecum, identified by appendiceal                         orifice and ileocecal valve. The colonoscopy was                         performed with moderate difficulty due to a redundant                         colon. Successful completion of the procedure was                         aided by applying abdominal pressure. The patient                         tolerated the procedure well. The quality of the bowel                         preparation was evaluated using the BBPS [facility] Bowel                          Preparation Scale) with scores of: Right Colon = 3,                         Transverse Colon = 3 and Left Colon = 3 (entire mucosa                         seen well with no residual staining, small fragments                         of stool or opaque liquid). The total BBPS score                         equals 9. The ileocecal valve, appendiceal orifice,                         and rectum were photographed. Findings:      The perianal and digital rectal examinations were normal. Pertinent       negatives include normal sphincter tone and no palpable rectal lesions.      The entire examined colon appeared normal.      The retroflexed view of the distal rectum and anal verge was normal and       showed no anal or rectal abnormalities. Impression:            - The entire examined colon is normal.                        - The distal rectum and anal verge are normal on                         retroflexion view.                        - No specimens collected. Recommendation:        -  Discharge patient to home (with escort).                        - Resume previous diet today.                        - Continue present medications.                        - Repeat colonoscopy in 10 years for screening                         purposes. Procedure Code(s):     --- Professional ---                        J1914, Colorectal cancer screening; colonoscopy on                         individual not meeting criteria for high risk Diagnosis Code(s):     --- Professional ---                        Z12.11, Encounter for screening for malignant neoplasm                         of colon CPT copyright 2022 American Medical Association. All rights reserved. The codes documented in this report are preliminary and upon coder review may  be revised to meet current compliance requirements. Dr. Evia Hof Selena Daily MD, MD 04/06/2024 8:47:09 AM This report has been signed electronically. Number  of Addenda: 0 Note Initiated On: 04/06/2024 7:54 AM Scope Withdrawal Time: 0 hours 10 minutes 46 seconds  Total Procedure Duration: 0 hours 17 minutes 28 seconds  Estimated Blood Loss:  Estimated blood loss: none.      [facility]

## 2024-04-06 NOTE — Transfer of Care (Signed)
 Immediate Anesthesia Transfer of Care Note  Patient: Katie Hays  Procedure(s) Performed: COLONOSCOPY  Patient Location: PACU and Endoscopy Unit  Anesthesia Type:General  Level of Consciousness: awake, alert , and patient cooperative  Airway & Oxygen Therapy: Patient Spontanous Breathing  Post-op Assessment: Report given to RN and Post -op Vital signs reviewed and stable  Post vital signs: Reviewed and stable  Last Vitals:  Vitals Value Taken Time  BP 99/53 04/06/24 0850  Temp    Pulse 59 04/06/24 0850  Resp 13 04/06/24 0850  SpO2 100 % 04/06/24 0850  Vitals shown include unfiled device data.  Last Pain:  Vitals:   04/06/24 0803  TempSrc: Temporal  PainSc: 0-No pain         Complications: No notable events documented.

## 2024-04-06 NOTE — Anesthesia Postprocedure Evaluation (Signed)
 Anesthesia Post Note  Patient: Katie Hays  Procedure(s) Performed: COLONOSCOPY  Patient location during evaluation: PACU Anesthesia Type: General Level of consciousness: awake and alert, oriented and patient cooperative Pain management: pain level controlled Vital Signs Assessment: post-procedure vital signs reviewed and stable Respiratory status: spontaneous breathing, nonlabored ventilation and respiratory function stable Cardiovascular status: blood pressure returned to baseline and stable Postop Assessment: adequate PO intake Anesthetic complications: no   No notable events documented.   Last Vitals:  Vitals:   04/06/24 0803 04/06/24 0850  BP: 105/62 (!) 99/53  Pulse: 67   Resp: 16   Temp: (!) 36.3 C (!) 36.2 C  SpO2: 100%     Last Pain:  Vitals:   04/06/24 0910  TempSrc:   PainSc: 0-No pain                 Dorothey Gate

## 2024-04-07 ENCOUNTER — Encounter: Payer: Self-pay | Admitting: Gastroenterology

## 2024-05-04 ENCOUNTER — Ambulatory Visit (INDEPENDENT_AMBULATORY_CARE_PROVIDER_SITE_OTHER): Admitting: Family Medicine

## 2024-05-04 ENCOUNTER — Encounter: Payer: Self-pay | Admitting: Family Medicine

## 2024-05-04 VITALS — BP 114/70 | HR 80 | Temp 98.3°F | Ht 66.0 in | Wt 178.1 lb

## 2024-05-04 DIAGNOSIS — G8929 Other chronic pain: Secondary | ICD-10-CM

## 2024-05-04 DIAGNOSIS — E559 Vitamin D deficiency, unspecified: Secondary | ICD-10-CM | POA: Diagnosis not present

## 2024-05-04 DIAGNOSIS — M25561 Pain in right knee: Secondary | ICD-10-CM

## 2024-05-04 DIAGNOSIS — D500 Iron deficiency anemia secondary to blood loss (chronic): Secondary | ICD-10-CM | POA: Diagnosis not present

## 2024-05-04 NOTE — Progress Notes (Signed)
 Name: Katie Hays   MRN: [id number]    DOB: 1981-11-19   Date:05/04/2024       Progress Note  Subjective  Chief Complaint  Chief Complaint  Patient presents with   Knee Pain    R knee, pain and swelling. Onset 5-6 months ago without injury.     Discussed the use of AI scribe software for clinical note transcription with the patient, who gave verbal consent to proceed.  History of Present Illness Katie Hays is a 43 year old female who presents with chronic right knee pain and swelling.  She has been experiencing right knee pain for the past five to six months, which is particularly severe when transitioning from sitting to standing. The pain is accompanied by swelling but no redness. It is described as 'extremely painful' and is exacerbated by prolonged sitting, such as during long drives, with a recent trip to [facility] significantly worsening her symptoms.  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 She has not had any surgeries on the knee but has undergone physical therapy for both legs in the past.  Current management includes using ice packs, heating pads, and taking Tylenol  for pain relief. Attempts to exercise have worsened her symptoms, and her trainer advised against training on the affected leg due to her inability to squat or perform certain movements without significant pain.  She experiences limitations in daily activities, such as difficulty going up and down stairs and discomfort when sitting on low furniture. To alleviate discomfort, she often stretches her leg across a chair to avoid bending the knee.  Her current medications include multivitamins, nortriptyline as needed for sleep, and she has completed a course of prescription vitamin D , now taking 2000 units daily over the counter. She no longer takes iron  supplements as her iron  deficiency anemia has improved, with recent CBC showing a  hemoglobin of 13.1 and a low white count of 3.8. She receives B12 injections at the cancer center, and her A1c has improved from 5.9 to 5.5.    Patient Active Problem List   Diagnosis Date Noted   Screening for colon cancer 04/06/2024   Primary insomnia 07/31/2023   Menorrhagia 07/13/2023   Low serum vitamin B12 07/13/2023   Iron  deficiency anemia 12/16/2021   History of pneumonia 12/06/2020   Hyperglycemia 01/31/2020   Pericardial effusion 12/31/2019   Vitamin D  deficiency 01/03/2019   Family history of heart disease in female family member before age 58 07/04/2016   MVP (mitral valve prolapse) 10/28/2015   Migraine headache 10/25/2015   Cervical high risk HPV (human papillomavirus) test positive 07/18/2015   H/O cesarean section 12/02/2012   History of blood transfusion 12/02/2012    Social History   Tobacco Use   Smoking status: Never   Smokeless tobacco: Never  Substance Use Topics   Alcohol use: Never    Comment: never     Current Outpatient Medications:    Cholecalciferol (VITAMIN D ) 50 MCG (2000 UT) CAPS, Take 1 capsule by mouth daily., Disp: , Rfl:    levonorgestrel  (MIRENA ) 20 MCG/DAY IUD, 1 each by Intrauterine route once., Disp: , Rfl:    Multiple Vitamins-Minerals (MULTIVITAMIN GUMMIES WOMENS PO), Take 2 each by mouth daily. VitaFusion Organic, Disp: , Rfl:    ipratropium (ATROVENT) 0.03 % nasal spray, Place into the nose. (Patient not taking: Reported on 05/04/2024), Disp: , Rfl:    nortriptyline (PAMELOR) 10 MG  capsule, Take 10 mg by mouth at bedtime. (Patient not taking: Reported on 05/04/2024), Disp: , Rfl:   Allergies  Allergen Reactions   Dilaudid [Hydromorphone Hcl] Swelling    ROS  Ten systems reviewed and is negative except as mentioned in HPI    Objective  Vitals:   05/04/24 0837  BP: 114/70  Pulse: 80  Temp: 98.3 F (36.8 C)  TempSrc: Oral  SpO2: 98%  Weight: 178 lb 1.6 oz (80.8 kg)  Height: 5' 6 (1.676 m)    Body mass index is 28.75  kg/m.   Physical Exam  CONSTITUTIONAL: Patient appears well-developed and well-nourished. No distress. HEENT: Head atraumatic, normocephalic, neck supple. CARDIOVASCULAR: Normal rate, regular rhythm and normal heart sounds. No murmur heard. No BLE edema. PULMONARY: Effort normal and breath sounds normal. No respiratory distress. ABDOMINAL: There is no tenderness or distention. MUSCULOSKELETAL: Grinding behind left patella. Pain in right knee on extension and flexion. Right knee swollen but no redness or increase in warmth  PSYCHIATRIC: Patient has a normal mood and affect. Behavior is normal. Judgment and thought content normal.  Recent Results (from the past 2160 hours)  Vitamin B12     Status: None   Collection Time: 03/04/24  1:55 PM  Result Value Ref Range   Vitamin B-12 400 180 - 914 pg/mL    Comment: (NOTE) This assay is not validated for testing neonatal or myeloproliferative syndrome specimens for Vitamin B12 levels. Performed at [facility], [address], [place], [place] [address]   Retic Panel     Status: None   Collection Time: 03/04/24  1:56 PM  Result Value Ref Range   Retic Ct Pct 1.5 0.4 - 3.1 %   RBC. 4.61 3.87 - 5.11 MIL/uL   Retic Count, Absolute 66.8 19.0 - 186.0 K/uL   Immature Retic Fract 7.6 2.3 - 15.9 %   Reticulocyte Hemoglobin 29.8 >27.9 pg    Comment: Performed at [facility], [address] [place], [place] [address]  Iron  and TIBC     Status: None   Collection Time: 03/04/24  1:56 PM  Result Value Ref Range   Iron  106 28 - 170 ug/dL   TIBC 829 562 - 130 ug/dL   Saturation Ratios 30 10.4 - 31.8 %   UIBC 250 ug/dL    Comment: Performed at [facility], [address] [place], [place] [address]  Ferritin     Status: Abnormal   Collection Time: 03/04/24  1:56 PM  Result Value Ref Range   Ferritin 8 (L) 11 - 307 ng/mL    Comment: Performed at [facility], [address] [place], [place] [address]  CBC with  Differential (Cancer Center Only)     Status: Abnormal   Collection Time: 03/04/24  1:56 PM  Result Value Ref Range   WBC Count 3.8 (L) 4.0 - 10.5 K/uL   RBC 4.60 3.87 - 5.11 MIL/uL   Hemoglobin 13.1 12.0 - 15.0 g/dL   HCT 95.2 84.1 - 32.4 %   MCV 88.5 80.0 - 100.0 fL   MCH 28.5 26.0 - 34.0 pg   MCHC 32.2 30.0 - 36.0 g/dL   RDW 40.1 02.7 - 25.3 %   Platelet Count 281 150 - 400 K/uL   nRBC 0.0 0.0 - 0.2 %   Neutrophils Relative % 38 %   Neutro Abs 1.4 (L) 1.7 - 7.7 K/uL   Lymphocytes Relative 47 %   Lymphs Abs 1.8 0.7 -  4.0 K/uL   Monocytes Relative 9 %   Monocytes Absolute 0.3 0.1 - 1.0 K/uL   Eosinophils Relative 5 %   Eosinophils Absolute 0.2 0.0 - 0.5 K/uL   Basophils Relative 1 %   Basophils Absolute 0.0 0.0 - 0.1 K/uL   Immature Granulocytes 0 %   Abs Immature Granulocytes 0.01 0.00 - 0.07 K/uL    Comment: Performed at [facility], [address] [place], [place] [address]  Pregnancy, urine POC     Status: None   Collection Time: 04/06/24  8:13 AM  Result Value Ref Range   Preg Test, Ur NEGATIVE NEGATIVE    Comment:        THE SENSITIVITY OF THIS METHODOLOGY IS >24 mIU/mL      Assessment & Plan Right Knee Pain with Effusion Chronic right knee pain with effusion for 5-6 months, likely due to patellofemoral syndrome or early arthritis. Effusion indicates significant inflammation. - Refer to sports medicine orthopedic specialist for evaluation and management. - Advise against activities that aggravate the knee, such as squatting and heavy leg exercises. - Recommend ice application to reduce inflammation. - Suggest maintaining leg in a stretched position when sitting to alleviate discomfort.  Iron  Deficiency Anemia Iron  deficiency anemia with previous iron  infusions. Hemoglobin improved to 13.1, but iron  stores remain low. Managed by oncologist.  Vitamin D  Deficiency Vitamin D  deficiency previously treated with prescription vitamin D . Advised to continue  with over-the-counter supplementation. - Advise taking 2000 units of over-the-counter vitamin D  daily.

## 2024-06-05 NOTE — Progress Notes (Unsigned)
 PCP: Sowles, Krichna, MD   No chief complaint on file.   HPI:      [name] is a 43 y.o. [id number] whose LMP was No LMP recorded. (Menstrual status: IUD)., presents today for her annual examination.  Her menses are {norm/abn:715}, lasting {number: 22536} days.  Dysmenorrhea {dysmen:716}. She {does:18564} have intermenstrual bleeding. She {does:18564} have vasomotor sx.  IDA improved on 4/25 labs.   Sex activity: {sex active: 315163}. Mirena  IUD placed   for menorrhagia with IUD. She {does:18564} have vaginal dryness.  Last Pap: 06/09/23 Results were: atypical squamous cellularity of undetermined significance (ASCUS) /neg HPV DNA.  Hx of STDs: {STD hx:14358}  Last mammogram: 03/11/23 Results were: normal--routine follow-up in 12 months There is no FH of breast cancer. There is no FH of ovarian cancer. The patient {does:18564} do self-breast exams.  Colonoscopy: {hx:15363}  Repeat due after 10*** years.   Tobacco use: {tob:20664} Alcohol use: {Alcohol:11675} No drug use Exercise: {exercise:31265}  She {does:18564} get adequate calcium and Vitamin D  in her diet.  Labs with PCP.   Patient Active Problem List   Diagnosis Date Noted   Screening for colon cancer 04/06/2024   Primary insomnia 07/31/2023   Menorrhagia 07/13/2023   Low serum vitamin B12 07/13/2023   Iron  deficiency anemia 12/16/2021   History of pneumonia 12/06/2020   Hyperglycemia 01/31/2020   Pericardial effusion 12/31/2019   Vitamin D  deficiency 01/03/2019   Family history of heart disease in female family member before age 50 07/04/2016   MVP (mitral valve prolapse) 10/28/2015   Migraine headache 10/25/2015   Cervical high risk HPV (human papillomavirus) test positive 07/18/2015   H/O cesarean section 12/02/2012   History of blood transfusion 12/02/2012    Past Surgical History:  Procedure Laterality Date   CESAREAN SECTION  04/30/2016   04/16/2012   COLONOSCOPY N/A 04/06/2024   Procedure:  COLONOSCOPY;  Surgeon: Unk Corinn Skiff, MD;  Location: [facility] ENDOSCOPY;  Service: Gastroenterology;  Laterality: N/A;   TUBAL LIGATION Bilateral 04/30/2016    Family History  Problem Relation Age of Onset   Clotting disorder Mother    Arthritis Mother    Anemia Mother    Irregular heart beat Mother    High Cholesterol Mother    Heart disease Father    Diabetes Father    Hypertension Father    Asthma Sister    Hypertension Sister    Diabetes Sister    Asthma Sister    Hypertension Sister    Heart attack Sister    Breast cancer Maternal Aunt    Breast cancer Cousin    Asthma Brother    Heart attack Brother     Social History   Socioeconomic History   Marital status: Married    Spouse name: Moses   Number of children: 2   Years of education: Not on file   Highest education level: Bachelor's degree (e.g., BA, AB, BS)  Occupational History   Occupation: real state appraisal  Tobacco Use   Smoking status: Never   Smokeless tobacco: Never  Vaping Use   Vaping status: Never Used  Substance and Sexual Activity   Alcohol use: Never    Comment: never   Drug use: No   Sexual activity: Yes    Partners: Male    Birth control/protection: Other-see comments    Comment: Tubal Ligation  Other Topics Concern   Not on file  Social History Narrative   Married and has two children, last one born  in 2017    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   Social Drivers of Health   Financial Resource Strain: Low Risk  (01/27/2024)   Overall Financial Resource Strain (CARDIA)    Difficulty of Paying Living Expenses: Not hard at all  Food Insecurity: No Food Insecurity (01/27/2024)   Hunger Vital Sign    Worried About Running Out of Food in the Last Year: Never true    Ran Out of Food in the Last Year: Never true  Transportation Needs: No Transportation Needs (01/27/2024)   PRAPARE - Administrator, Civil Service (Medical): No    Lack of Transportation (Non-Medical): No   Physical Activity: Sufficiently Active (01/27/2024)   Exercise Vital Sign    Days of Exercise per Week: 3 days    Minutes of Exercise per Session: 60 min  Stress: Stress Concern Present (01/27/2024)   [employer] of Occupational Health - Occupational Stress Questionnaire    Feeling of Stress : Very much  Social Connections: Socially Integrated (01/27/2024)   Social Connection and Isolation Panel    Frequency of Communication with Friends and Family: More than three times a week    Frequency of Social Gatherings with Friends and Family: Twice a week    Attends Religious Services: More than 4 times per year    Active Member of [employer] or Organizations: Yes    Attends Engineer, structural: More than 4 times per year    Marital Status: Married  Catering manager Violence: Not At Risk (01/27/2024)   Humiliation, Afraid, Rape, and Kick questionnaire    Fear of Current or Ex-Partner: No    Emotionally Abused: No    Physically Abused: No    Sexually Abused: No     Current Outpatient Medications:    Cholecalciferol (VITAMIN D ) 50 MCG (2000 UT) CAPS, Take 1 capsule by mouth daily., Disp: , Rfl:    ipratropium (ATROVENT) 0.03 % nasal spray, Place into the nose. (Patient not taking: Reported on 05/04/2024), Disp: , Rfl:    levonorgestrel  (MIRENA ) 20 MCG/DAY IUD, 1 each by Intrauterine route once., Disp: , Rfl:    Multiple Vitamins-Minerals (MULTIVITAMIN GUMMIES WOMENS PO), Take 2 each by mouth daily. VitaFusion Organic, Disp: , Rfl:    nortriptyline (PAMELOR) 10 MG capsule, Take 10 mg by mouth at bedtime. (Patient not taking: Reported on 05/04/2024), Disp: , Rfl:      ROS:  Review of Systems BREAST: No symptoms    Objective: There were no vitals taken for this visit.   OBGyn Exam  Results: No results found for this or any previous visit (from the past 24 hours).  Assessment/Plan:  No diagnosis found.   No orders of the defined types were placed in this encounter.            GYN counsel {counseling: 16159}    F/U  No follow-ups on file.  Danyale Ridinger B. Elisa Sorlie, PA-C 06/05/2024 2:30 PM

## 2024-06-06 ENCOUNTER — Ambulatory Visit (INDEPENDENT_AMBULATORY_CARE_PROVIDER_SITE_OTHER): Admitting: Obstetrics and Gynecology

## 2024-06-06 ENCOUNTER — Encounter: Payer: Self-pay | Admitting: Obstetrics and Gynecology

## 2024-06-06 ENCOUNTER — Other Ambulatory Visit (HOSPITAL_COMMUNITY)
Admission: RE | Admit: 2024-06-06 | Discharge: 2024-06-06 | Disposition: A | Discharge status: Home / Self Care | Source: Ambulatory Visit | Attending: Obstetrics and Gynecology | Admitting: Obstetrics and Gynecology

## 2024-06-06 VITALS — BP 99/63 | HR 85 | Ht 66.0 in | Wt 176.0 lb

## 2024-06-06 DIAGNOSIS — N920 Excessive and frequent menstruation with regular cycle: Secondary | ICD-10-CM | POA: Diagnosis not present

## 2024-06-06 DIAGNOSIS — Z1151 Encounter for screening for human papillomavirus (HPV): Secondary | ICD-10-CM | POA: Diagnosis not present

## 2024-06-06 DIAGNOSIS — Z124 Encounter for screening for malignant neoplasm of cervix: Secondary | ICD-10-CM | POA: Insufficient documentation

## 2024-06-06 DIAGNOSIS — Z01411 Encounter for gynecological examination (general) (routine) with abnormal findings: Secondary | ICD-10-CM

## 2024-06-06 DIAGNOSIS — N760 Acute vaginitis: Secondary | ICD-10-CM | POA: Diagnosis not present

## 2024-06-06 DIAGNOSIS — T8332XA Displacement of intrauterine contraceptive device, initial encounter: Secondary | ICD-10-CM

## 2024-06-06 DIAGNOSIS — Z30431 Encounter for routine checking of intrauterine contraceptive device: Secondary | ICD-10-CM

## 2024-06-06 DIAGNOSIS — R87610 Atypical squamous cells of undetermined significance on cytologic smear of cervix (ASC-US): Secondary | ICD-10-CM | POA: Insufficient documentation

## 2024-06-06 DIAGNOSIS — Z01419 Encounter for gynecological examination (general) (routine) without abnormal findings: Secondary | ICD-10-CM

## 2024-06-06 DIAGNOSIS — B9689 Other specified bacterial agents as the cause of diseases classified elsewhere: Secondary | ICD-10-CM

## 2024-06-06 DIAGNOSIS — Z1272 Encounter for screening for malignant neoplasm of vagina: Secondary | ICD-10-CM

## 2024-06-06 DIAGNOSIS — M25561 Pain in right knee: Secondary | ICD-10-CM | POA: Diagnosis not present

## 2024-06-06 DIAGNOSIS — Z1231 Encounter for screening mammogram for malignant neoplasm of breast: Secondary | ICD-10-CM

## 2024-06-06 DIAGNOSIS — Z1211 Encounter for screening for malignant neoplasm of colon: Secondary | ICD-10-CM

## 2024-06-06 LAB — POCT WET PREP WITH KOH
Clue Cells Wet Prep HPF POC: POSITIVE
KOH Prep POC: POSITIVE — AB
Trichomonas, UA: NEGATIVE
Yeast Wet Prep HPF POC: NEGATIVE

## 2024-06-06 MED ORDER — METRONIDAZOLE 500 MG PO TABS: ORAL_TABLET | ORAL | 0 refills | Status: DC

## 2024-06-06 NOTE — Patient Instructions (Signed)
 I value your feedback and you entrusting [place] with your care. If you get a [facility] patient survey, I would appreciate you taking the time to let us know about your experience today. Thank you!  [facility] ([place])--[phone]

## 2024-06-10 LAB — CYTOLOGY - PAP
Comment: NEGATIVE
Diagnosis: NEGATIVE
High risk HPV: NEGATIVE

## 2024-06-22 ENCOUNTER — Ambulatory Visit

## 2024-06-22 DIAGNOSIS — T8332XA Displacement of intrauterine contraceptive device, initial encounter: Secondary | ICD-10-CM | POA: Diagnosis not present

## 2024-06-22 DIAGNOSIS — Z30431 Encounter for routine checking of intrauterine contraceptive device: Secondary | ICD-10-CM

## 2024-06-26 ENCOUNTER — Ambulatory Visit: Payer: Self-pay | Admitting: Obstetrics and Gynecology

## 2024-07-12 DIAGNOSIS — M25561 Pain in right knee: Secondary | ICD-10-CM | POA: Diagnosis not present

## 2024-07-12 DIAGNOSIS — G8929 Other chronic pain: Secondary | ICD-10-CM | POA: Diagnosis not present

## 2024-08-01 ENCOUNTER — Encounter: Payer: Self-pay | Admitting: Family Medicine

## 2024-08-01 ENCOUNTER — Ambulatory Visit (INDEPENDENT_AMBULATORY_CARE_PROVIDER_SITE_OTHER): Admitting: Family Medicine

## 2024-08-01 VITALS — BP 106/62 | HR 99 | Resp 16 | Ht 66.0 in | Wt 176.7 lb

## 2024-08-01 DIAGNOSIS — D708 Other neutropenia: Secondary | ICD-10-CM

## 2024-08-01 DIAGNOSIS — F5101 Primary insomnia: Secondary | ICD-10-CM

## 2024-08-01 DIAGNOSIS — E538 Deficiency of other specified B group vitamins: Secondary | ICD-10-CM

## 2024-08-01 DIAGNOSIS — E559 Vitamin D deficiency, unspecified: Secondary | ICD-10-CM | POA: Diagnosis not present

## 2024-08-01 DIAGNOSIS — G8929 Other chronic pain: Secondary | ICD-10-CM | POA: Diagnosis not present

## 2024-08-01 DIAGNOSIS — N921 Excessive and frequent menstruation with irregular cycle: Secondary | ICD-10-CM

## 2024-08-01 DIAGNOSIS — D500 Iron deficiency anemia secondary to blood loss (chronic): Secondary | ICD-10-CM | POA: Diagnosis not present

## 2024-08-01 DIAGNOSIS — M25561 Pain in right knee: Secondary | ICD-10-CM

## 2024-08-01 DIAGNOSIS — Z1159 Encounter for screening for other viral diseases: Secondary | ICD-10-CM | POA: Diagnosis not present

## 2024-08-01 NOTE — Progress Notes (Signed)
 Name: Katie Hays   MRN: [id number]    DOB: 10-07-1981   Date:08/01/2024       Progress Note  Subjective  Chief Complaint  Chief Complaint  Patient presents with   Medical Management of Chronic Issues   Discussed the use of AI scribe software for clinical note transcription with the patient, who gave verbal consent to proceed.  History of Present Illness Katie Hays is a 43 year old female with chronic right knee pain and iron  deficiency anemia who presents for a six-month follow-up.  She has been experiencing chronic right knee pain for over six months, which worsens when transitioning from sitting to standing. The pain is occasionally accompanied by swelling and redness and is exacerbated by long drives.  She has not undergone knee surgery but resumed physical therapy two weeks ago. Previous treatments include ice packs, heating pads, and Tylenol , which have not provided significant relief. She has been prescribed exercises focusing on strengthening the muscles around the knee. An x-ray ruled out arthritis, but there is noted stiffness and muscle weakness around the knee. Additionally, her left hip is reportedly weaker, possibly due to overcompensation.  She has a history of iron  deficiency anemia and neutropenia, with the last hematology consultation in December 24. She takes STB12 sublingually four times a week but has not been on iron  pills, relying instead on vitamins containing iron . She previously received iron  infusions, with the last one in December. She reports extreme fatigue and experienced a fainting episode in July, which occurred while she was standing in a hot environment and had not eaten that day. No palpitations or shortness of breath outside of physical exertion.  She has a Mirena  IUD placed in March to manage menorrhagia but reports increased bleeding duration and weight gain since its insertion. Her menstrual cycle has become irregular, lasting up to two and a half  weeks. She has not been diagnosed with uterine fibroids or other bleeding disorders, and she does not experience excessive bleeding from cuts.    Patient Active Problem List   Diagnosis Date Noted   Screening for colon cancer 04/06/2024   Primary insomnia 07/31/2023   Menorrhagia 07/13/2023   Low serum vitamin B12 07/13/2023   Iron  deficiency anemia 12/16/2021   History of pneumonia 12/06/2020   Hyperglycemia 01/31/2020   Pericardial effusion 12/31/2019   Vitamin D  deficiency 01/03/2019   Family history of heart disease in female family member before age 69 07/04/2016   MVP (mitral valve prolapse) 10/28/2015   Migraine headache 10/25/2015   Cervical high risk HPV (human papillomavirus) test positive 07/18/2015   H/O cesarean section 12/02/2012   History of blood transfusion 12/02/2012    Past Surgical History:  Procedure Laterality Date   CESAREAN SECTION  04/30/2016   04/16/2012   COLONOSCOPY N/A 04/06/2024   Procedure: COLONOSCOPY;  Surgeon: Unk Corinn Skiff, MD;  Location: [facility] ENDOSCOPY;  Service: Gastroenterology;  Laterality: N/A;   TUBAL LIGATION Bilateral 04/30/2016    Family History  Problem Relation Age of Onset   Clotting disorder Mother    Arthritis Mother    Anemia Mother    Irregular heart beat Mother    High Cholesterol Mother    Heart disease Father    Diabetes Father    Hypertension Father    Asthma Sister    Hypertension Sister    Diabetes Sister    Asthma Sister    Hypertension Sister    Heart attack Sister    Asthma Brother  Heart attack Brother     Social History   Tobacco Use   Smoking status: Never   Smokeless tobacco: Never  Substance Use Topics   Alcohol use: Never    Comment: never     Current Outpatient Medications:    Cholecalciferol (VITAMIN D ) 50 MCG (2000 UT) CAPS, Take 1 capsule by mouth daily., Disp: , Rfl:    ipratropium (ATROVENT) 0.03 % nasal spray, Place into the nose., Disp: , Rfl:    levonorgestrel  (MIRENA ) 20  MCG/DAY IUD, 1 each by Intrauterine route once., Disp: , Rfl:    Multiple Vitamins-Minerals (MULTIVITAMIN GUMMIES WOMENS PO), Take 2 each by mouth daily. VitaFusion Organic, Disp: , Rfl:    nortriptyline (PAMELOR) 10 MG capsule, Take 10 mg by mouth at bedtime., Disp: , Rfl:   Allergies  Allergen Reactions   Dilaudid [Hydromorphone Hcl] Swelling    I personally reviewed active problem list, medication list, allergies, family history with the patient/caregiver today.   ROS  Ten systems reviewed and is negative except as mentioned in HPI    Objective Physical Exam CONSTITUTIONAL: Patient appears well-developed and well-nourished.  No distress. HEENT: Head atraumatic, normocephalic, neck supple. CARDIOVASCULAR: Normal rate, regular rhythm and normal heart sounds.  No murmur heard. No BLE edema. PULMONARY: Effort normal and breath sounds normal. No respiratory distress. ABDOMINAL: There is no tenderness or distention. MUSCULOSKELETAL: Normal gait. Without gross motor or sensory deficit. PSYCHIATRIC: Patient has a normal mood and affect. behavior is normal. Judgment and thought content normal.  Vitals:   08/01/24 0846  BP: 106/62  Pulse: 99  Resp: 16  SpO2: 99%  Weight: 176 lb 11.2 oz (80.2 kg)  Height: 5' 6 (1.676 m)    Body mass index is 28.52 kg/m.  Recent Results (from the past 2160 hours)  Cytology - PAP     Status: None   Collection Time: 06/06/24  1:23 PM  Result Value Ref Range   High risk HPV Negative    Adequacy      Satisfactory for evaluation; transformation zone component PRESENT.   Diagnosis      - Negative for intraepithelial lesion or malignancy (NILM)   Comment Normal Reference Range HPV - Negative   POCT Wet Prep with KOH     Status: Abnormal   Collection Time: 06/06/24  4:50 PM  Result Value Ref Range   Trichomonas, UA Negative    Clue Cells Wet Prep HPF POC pos    Epithelial Wet Prep HPF POC     Yeast Wet Prep HPF POC neg    Bacteria Wet Prep HPF  POC     RBC Wet Prep HPF POC     WBC Wet Prep HPF POC     KOH Prep POC Positive (A) Negative     PHQ2/9:    08/01/2024    8:39 AM 05/04/2024    8:43 AM 01/27/2024    9:05 AM 07/31/2023    9:23 AM 04/02/2023    8:23 AM  Depression screen PHQ 2/9  Decreased Interest 0 0 0 0 0  Down, Depressed, Hopeless 0 0 0 0 0  PHQ - 2 Score 0 0 0 0 0  Altered sleeping  0 0 0 0  Tired, decreased energy  0 0 0 0  Change in appetite  0 0 0 0  Feeling bad or failure about yourself   0 0 0 0  Trouble concentrating  0 0 0 0  Moving slowly or fidgety/restless  0 0  0 0  Suicidal thoughts  0 0 0 0  PHQ-9 Score  0 0 0 0  Difficult doing work/chores  Not difficult at all Not difficult at all  Not difficult at all    phq 9 is negative  Fall Risk:    08/01/2024    8:39 AM 05/04/2024    8:43 AM 07/31/2023    9:23 AM 04/02/2023    8:23 AM 01/26/2023    9:20 AM  Fall Risk   Falls in the past year? 0 0 0 0 0  Number falls in past yr: 0 0  0 0  Injury with Fall? 0 0  0 0  Risk for fall due to : No Fall Risks No Fall Risks No Fall Risks No Fall Risks No Fall Risks  Follow up Falls evaluation completed Falls evaluation completed Falls prevention discussed Falls prevention discussed;Education provided;Falls evaluation completed Falls prevention discussed;Education provided;Falls evaluation completed      Assessment & Plan Chronic right knee pain Chronic right knee pain with stiffness and muscle weakness, exacerbated by long drives and transitioning from sitting to standing. No evidence of arthritis on X-ray. Currently undergoing physical therapy with no significant improvement yet. - Continue physical therapy and follow up with Dr. Earnestine Blanch   Iron  deficiency anemia due to chronic blood loss Iron  deficiency anemia likely due to excessive menstrual bleeding. Recent fainting episode in July, possibly related to anemia and prolonged fasting. Reports fatigue, no current palpitations or shortness of breath. - Order  CBC, iron  studies, ferritin, reticulocyte count. - Refer to hematologist for follow-up and potential iron  infusion. - Advise against skipping meals to prevent hypoglycemia.  Excessive and frequent menstruation with irregular cycle Excessive and frequent menstruation with irregular cycles despite Mirena  IUD placement in March. No improvement in bleeding pattern, with prolonged bleeding episodes. Consideration of von Willebrand disease as a potential cause of bleeding disorder. Discussed potential for endometrial ablation if bleeding persists and confirmed no desire for future pregnancies. - Order von Willebrand panel. - Discuss potential for endometrial ablation if bleeding persists.  Vitamin D  deficiency Vitamin D  deficiency with no current supplementation. Previous recommendation for over-the-counter vitamin D  supplementation not followed. - Recommend over-the-counter vitamin D  2000 IU daily.  Vitamin B12 deficiency Vitamin B12 deficiency with previous sublingual supplementation. Reports fatigue, which may be related to low B12 levels. Last B12 infusion was in December. Discussed the importance of consistent supplementation and potential need for infusion based on lab results. - Order B12 level. - Continue B12 sublingual 1000 mcg four times a week. - Refer to hematologist for potential B12 infusion based on lab results.  Other Neutropenia Neutropenia with no recent follow-up with hematologist. Last lab work was in April. - Order CBC to assess current neutrophil count. - Refer to hematologist for follow-up.

## 2024-08-03 ENCOUNTER — Ambulatory Visit: Payer: Self-pay | Admitting: Family Medicine

## 2024-08-04 LAB — CBC WITH DIFFERENTIAL/PLATELET
Absolute Lymphocytes: 1530 {cells}/uL (ref 850–3900)
Absolute Monocytes: 231 {cells}/uL (ref 200–950)
Basophils Absolute: 30 {cells}/uL (ref 0–200)
Basophils Relative: 1 %
Eosinophils Absolute: 120 {cells}/uL (ref 15–500)
Eosinophils Relative: 4 %
HCT: 43.9 % (ref 35.0–45.0)
Hemoglobin: 13.8 g/dL (ref 11.7–15.5)
MCH: 28.3 pg (ref 27.0–33.0)
MCHC: 31.4 g/dL — ABNORMAL LOW (ref 32.0–36.0)
MCV: 90.1 fL (ref 80.0–100.0)
MPV: 8.8 fL (ref 7.5–12.5)
Monocytes Relative: 7.7 %
Neutro Abs: 1089 {cells}/uL — ABNORMAL LOW (ref 1500–7800)
Neutrophils Relative %: 36.3 %
Platelets: 294 Thousand/uL (ref 140–400)
RBC: 4.87 Million/uL (ref 3.80–5.10)
RDW: 12.3 % (ref 11.0–15.0)
Total Lymphocyte: 51 %
WBC: 3 Thousand/uL — ABNORMAL LOW (ref 3.8–10.8)

## 2024-08-04 LAB — IRON,TIBC AND FERRITIN PANEL
%SAT: 36 % (ref 16–45)
Ferritin: 10 ng/mL — ABNORMAL LOW (ref 16–232)
Iron: 118 ng/mL — AB (ref 40–232)
TIBC: 327 ug/dL (ref 250–450)

## 2024-08-04 LAB — VON WILLEBRAND PANEL
Factor-VIII Activity: 97 %{normal} (ref 50–180)
Ristocetin Co-Factor: 67 %{normal} (ref 42–200)
Von Willebrand Antigen, Plasma: 74 % (ref 50–217)
aPTT: 32 s (ref 23–32)

## 2024-08-04 LAB — B12 AND FOLATE PANEL
Folate: 11.2 ng/mL
Vitamin B-12: 394 pg/mL (ref 200–1100)

## 2024-08-04 LAB — HEPATITIS B SURFACE ANTIBODY,QUALITATIVE: Hep B S Ab: NONREACTIVE

## 2024-08-04 LAB — RETICULOCYTES
ABS Retic: 73050 {cells}/uL (ref 20000–80000)
Retic Ct Pct: 1.5 %

## 2024-08-08 DIAGNOSIS — Z79620 Long term (current) use of immunosuppressive biologic: Secondary | ICD-10-CM | POA: Diagnosis not present

## 2024-08-08 DIAGNOSIS — L630 Alopecia (capitis) totalis: Secondary | ICD-10-CM | POA: Diagnosis not present

## 2024-08-08 DIAGNOSIS — Z79899 Other long term (current) drug therapy: Secondary | ICD-10-CM | POA: Diagnosis not present

## 2024-08-24 ENCOUNTER — Ambulatory Visit

## 2024-09-30 ENCOUNTER — Ambulatory Visit: Payer: Self-pay

## 2024-09-30 ENCOUNTER — Emergency Department
Admission: EM | Admit: 2024-09-30 | Discharge: 2024-09-30 | Disposition: A | Discharge status: Home / Self Care | Attending: Emergency Medicine | Admitting: Emergency Medicine

## 2024-09-30 ENCOUNTER — Emergency Department

## 2024-09-30 ENCOUNTER — Other Ambulatory Visit: Payer: Self-pay

## 2024-09-30 DIAGNOSIS — M5441 Lumbago with sciatica, right side: Secondary | ICD-10-CM | POA: Diagnosis not present

## 2024-09-30 DIAGNOSIS — S3992XA Unspecified injury of lower back, initial encounter: Secondary | ICD-10-CM | POA: Diagnosis not present

## 2024-09-30 DIAGNOSIS — K7689 Other specified diseases of liver: Secondary | ICD-10-CM | POA: Diagnosis not present

## 2024-09-30 DIAGNOSIS — M25551 Pain in right hip: Secondary | ICD-10-CM | POA: Diagnosis not present

## 2024-09-30 DIAGNOSIS — Z041 Encounter for examination and observation following transport accident: Secondary | ICD-10-CM | POA: Diagnosis not present

## 2024-09-30 DIAGNOSIS — M5442 Lumbago with sciatica, left side: Secondary | ICD-10-CM | POA: Diagnosis not present

## 2024-09-30 DIAGNOSIS — M47812 Spondylosis without myelopathy or radiculopathy, cervical region: Secondary | ICD-10-CM | POA: Diagnosis not present

## 2024-09-30 DIAGNOSIS — M549 Dorsalgia, unspecified: Secondary | ICD-10-CM | POA: Diagnosis not present

## 2024-09-30 DIAGNOSIS — M5440 Lumbago with sciatica, unspecified side: Secondary | ICD-10-CM

## 2024-09-30 DIAGNOSIS — M545 Low back pain, unspecified: Secondary | ICD-10-CM | POA: Diagnosis not present

## 2024-09-30 DIAGNOSIS — Y92410 Unspecified street and highway as the place of occurrence of the external cause: Secondary | ICD-10-CM | POA: Insufficient documentation

## 2024-09-30 DIAGNOSIS — M79606 Pain in leg, unspecified: Secondary | ICD-10-CM | POA: Diagnosis not present

## 2024-09-30 DIAGNOSIS — M4802 Spinal stenosis, cervical region: Secondary | ICD-10-CM | POA: Diagnosis not present

## 2024-09-30 DIAGNOSIS — I3139 Other pericardial effusion (noninflammatory): Secondary | ICD-10-CM | POA: Diagnosis not present

## 2024-09-30 LAB — URINALYSIS, ROUTINE W REFLEX MICROSCOPIC
Bilirubin Urine: NEGATIVE
Glucose, UA: NEGATIVE mg/dL
Hgb urine dipstick: NEGATIVE
Ketones, ur: 5 mg/dL — AB
Leukocytes,Ua: NEGATIVE
Nitrite: NEGATIVE
Protein, ur: NEGATIVE mg/dL
Specific Gravity, Urine: >1.046 — ABNORMAL HIGH (ref 1.005–1.030)
pH: 6 (ref 5.0–8.0)

## 2024-09-30 LAB — COMPREHENSIVE METABOLIC PANEL WITH GFR
ALT: 15 U/L (ref 0–44)
AST: 19 U/L (ref 15–41)
Albumin: 3.8 g/dL (ref 3.5–5.0)
Alkaline Phosphatase: 60 U/L (ref 38–126)
Anion gap: 10 (ref 5–15)
BUN: 9 mg/dL (ref 6–20)
CO2: 24 mmol/L (ref 22–32)
Calcium: 9 mg/dL (ref 8.9–10.3)
Chloride: 105 mmol/L (ref 98–111)
Creatinine, Ser: 0.62 mg/dL (ref 0.44–1.00)
GFR, Estimated: >60 mL/min (ref >60)
Glucose, Bld: 89 mg/dL (ref 70–99)
Potassium: 3.8 mmol/L (ref 3.5–5.1)
Sodium: 139 mmol/L (ref 135–145)
Total Bilirubin: 0.4 mg/dL (ref 0.0–1.2)
Total Protein: 7.4 g/dL (ref 6.5–8.1)

## 2024-09-30 LAB — CBC
HCT: 38.3 % (ref 36.0–46.0)
Hemoglobin: 12.3 g/dL (ref 12.0–15.0)
MCH: 28.4 pg (ref 26.0–34.0)
MCHC: 32.1 g/dL (ref 30.0–36.0)
MCV: 88.5 fL (ref 80.0–100.0)
Platelets: 258 K/uL (ref 150–400)
RBC: 4.33 MIL/uL (ref 3.87–5.11)
RDW: 12.7 % (ref 11.5–15.5)
WBC: 4.4 K/uL (ref 4.0–10.5)
nRBC: 0 % (ref 0.0–0.2)

## 2024-09-30 LAB — CK: Total CK: 133 U/L (ref 38–234)

## 2024-09-30 LAB — HCG, QUANTITATIVE, PREGNANCY: hCG, Beta Chain, Quant, S: 1 m[IU]/mL (ref <5)

## 2024-09-30 MED ORDER — IOHEXOL 300 MG/ML  SOLN
100.0000 mL | Freq: Once | INTRAMUSCULAR | Status: AC | PRN
  Administered 2024-09-30: 100 mL via INTRAVENOUS

## 2024-09-30 MED ORDER — IBUPROFEN 800 MG PO TABS
800.0000 mg | ORAL_TABLET | ORAL | Status: AC
  Administered 2024-09-30: 800 mg via ORAL
  Filled 2024-09-30: qty 1

## 2024-09-30 MED ORDER — MORPHINE SULFATE (PF) 2 MG/ML IV SOLN
2.0000 mg | Freq: Once | INTRAVENOUS | Status: AC
  Administered 2024-09-30: 2 mg via INTRAVENOUS
  Filled 2024-09-30: qty 1

## 2024-09-30 MED ORDER — IOHEXOL 350 MG/ML SOLN: 100.0000 mL | Freq: Once | INTRAVENOUS | Status: DC | PRN

## 2024-09-30 MED ORDER — DEXAMETHASONE 4 MG PO TABS
8.0000 mg | ORAL_TABLET | Freq: Once | ORAL | Status: AC
  Administered 2024-09-30: 8 mg via ORAL
  Filled 2024-09-30: qty 2

## 2024-09-30 MED ORDER — OXYCODONE-ACETAMINOPHEN 5-325 MG PO TABS: 1.0000 | ORAL_TABLET | Freq: Four times a day (QID) | ORAL | 0 refills | Status: AC | PRN

## 2024-09-30 MED ORDER — OXYCODONE-ACETAMINOPHEN 5-325 MG PO TABS
1.0000 | ORAL_TABLET | ORAL | Status: AC
  Administered 2024-09-30: 1 via ORAL
  Filled 2024-09-30: qty 1

## 2024-09-30 MED ORDER — MORPHINE SULFATE (PF) 4 MG/ML IV SOLN
6.0000 mg | Freq: Once | INTRAVENOUS | Status: AC
  Administered 2024-09-30: 6 mg via INTRAVENOUS
  Filled 2024-09-30: qty 2
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
## 2024-09-30 NOTE — Telephone Encounter (Signed)
 Please see alternate NT encounter in chart from today.
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
## 2024-09-30 NOTE — Discharge Instructions (Addendum)
 Follow up with your primary care provider next week if symptoms are not improving.  Return to the ER for symptoms of concern if unable to schedule an appointment.
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
## 2024-11-08 ENCOUNTER — Other Ambulatory Visit: Payer: Self-pay | Admitting: Surgery

## 2024-11-08 DIAGNOSIS — S39012A Strain of muscle, fascia and tendon of lower back, initial encounter: Secondary | ICD-10-CM

## 2025-01-30 ENCOUNTER — Encounter: Admitting: Family Medicine

## 2025-01-31 ENCOUNTER — Ambulatory Visit: Admitting: Family Medicine
